# Patient Record
Sex: Female | Born: 1935
Health system: Southern US, Community
[De-identification: ages and names within clinical notes are randomized; demographics above are authoritative.]

## PROBLEM LIST (undated history)

## (undated) DIAGNOSIS — T4145XA Adverse effect of unspecified anesthetic, initial encounter: Secondary | ICD-10-CM

## (undated) DIAGNOSIS — T8859XA Other complications of anesthesia, initial encounter: Secondary | ICD-10-CM

## (undated) DIAGNOSIS — I4891 Unspecified atrial fibrillation: Secondary | ICD-10-CM

## (undated) DIAGNOSIS — Z789 Other specified health status: Secondary | ICD-10-CM

## (undated) DIAGNOSIS — I10 Essential (primary) hypertension: Secondary | ICD-10-CM

## (undated) HISTORY — PX: TUBAL LIGATION: SHX77

## (undated) HISTORY — PX: EYE SURGERY: SHX253

## (undated) HISTORY — PX: MOUTH SURGERY: SHX715

## (undated) HISTORY — PX: ABDOMINAL HYSTERECTOMY: SHX81

## (undated) HISTORY — PX: TONSILLECTOMY: SUR1361

---

## 1999-09-21 ENCOUNTER — Encounter: Payer: Self-pay | Admitting: Internal Medicine

## 1999-09-21 ENCOUNTER — Encounter: Admission: RE | Admit: 1999-09-21 | Discharge: 1999-09-21 | Payer: Self-pay | Admitting: Internal Medicine

## 2001-02-15 ENCOUNTER — Encounter: Payer: Self-pay | Admitting: Internal Medicine

## 2001-02-15 ENCOUNTER — Encounter: Admission: RE | Admit: 2001-02-15 | Discharge: 2001-02-15 | Payer: Self-pay | Admitting: Internal Medicine

## 2002-02-14 ENCOUNTER — Encounter: Payer: Self-pay | Admitting: Internal Medicine

## 2002-02-14 ENCOUNTER — Encounter: Admission: RE | Admit: 2002-02-14 | Discharge: 2002-02-14 | Payer: Self-pay | Admitting: Internal Medicine

## 2002-03-20 ENCOUNTER — Encounter: Payer: Self-pay | Admitting: Ophthalmology

## 2002-03-22 ENCOUNTER — Ambulatory Visit (HOSPITAL_COMMUNITY): Admission: RE | Admit: 2002-03-22 | Discharge: 2002-03-23 | Payer: Self-pay | Admitting: Ophthalmology

## 2003-05-14 ENCOUNTER — Encounter: Admission: RE | Admit: 2003-05-14 | Discharge: 2003-05-14 | Payer: Self-pay | Admitting: Internal Medicine

## 2003-05-14 ENCOUNTER — Encounter: Payer: Self-pay | Admitting: Internal Medicine

## 2005-05-11 ENCOUNTER — Encounter: Admission: RE | Admit: 2005-05-11 | Discharge: 2005-05-11 | Payer: Self-pay | Admitting: Internal Medicine

## 2006-06-01 ENCOUNTER — Encounter: Admission: RE | Admit: 2006-06-01 | Discharge: 2006-06-01 | Payer: Self-pay | Admitting: Internal Medicine

## 2007-08-03 ENCOUNTER — Encounter: Admission: RE | Admit: 2007-08-03 | Discharge: 2007-08-03 | Payer: Self-pay | Admitting: Internal Medicine

## 2008-11-20 ENCOUNTER — Encounter: Admission: RE | Admit: 2008-11-20 | Discharge: 2008-11-20 | Payer: Self-pay | Admitting: Internal Medicine

## 2009-08-10 ENCOUNTER — Encounter: Admission: RE | Admit: 2009-08-10 | Discharge: 2009-08-10 | Payer: Self-pay | Admitting: Internal Medicine

## 2010-02-19 ENCOUNTER — Encounter: Admission: RE | Admit: 2010-02-19 | Discharge: 2010-02-19 | Payer: Self-pay | Admitting: Internal Medicine

## 2010-06-17 ENCOUNTER — Ambulatory Visit: Payer: Self-pay | Admitting: Diagnostic Radiology

## 2010-06-17 ENCOUNTER — Emergency Department (HOSPITAL_BASED_OUTPATIENT_CLINIC_OR_DEPARTMENT_OTHER): Admission: EM | Admit: 2010-06-17 | Discharge: 2010-06-17 | Payer: Self-pay | Admitting: Emergency Medicine

## 2010-06-17 ENCOUNTER — Emergency Department (HOSPITAL_COMMUNITY): Admission: EM | Admit: 2010-06-17 | Discharge: 2010-06-17 | Payer: Self-pay | Admitting: Emergency Medicine

## 2011-01-11 HISTORY — PX: DENTAL SURGERY: SHX609

## 2011-01-21 NOTE — Op Note (Signed)
[place]  Patient:    Gail Jennings, Gail Jennings Visit Number: [id number] MRN: [id number]          Service Type: DSU Location: 5700 [id number] Attending Physician:  Ivor Messier Dictated by:   Guadelupe Sabin, M.D. Proc. Date: 03/22/02 Admit Date:  03/22/2002 Discharge Date: 03/23/2002   CC:         Lenon Curt. Cassell Clement, M.D.   Operative Report  PREOPERATIVE DIAGNOSIS:  Senile amber nuclear cataract, right eye.  POSTOPERATIVE DIAGNOSIS:  Senile amber nuclear cataract, right eye.  OPERATION:  Planned extracapsular cataract extraction - phacoemulsification, primary insertion of posterior chamber intraocular lens implant.  SURGEON:  Guadelupe Sabin, M.D.  ASSISTANT:  Nurse.  ANESTHESIA:  General, due to patients high anxiety.  OPERATIVE PROCEDURE:  After the patient was prepped and draped, a lid speculum was inserted in the right eye.  The eye was turned downward and the superior rectus traction suture placed.  Schiotz tonometry was recorded at 12 scale units with a 5.5 g weight.  A peritomy was performed adjacent to the limbus from the 11 to 1 oclock position.  A corneal scleral groove was placed with a 45 degree Superblade.  The anterior chamber was then entered at the 12 oclock position with a 2.5 mm diamond keratome and at the 2:30 position with a 15 degree blade.  Using a bent 26 gauge needle and a Helons syringe, a circular capsular rectus was begun and then completed with the _________ forceps.  Hydrodissection and hydrodelineation were performed using 1% Xylocaine.  A 30 degree phacoemulsification tip was then inserted with slow controlled emulsification of the lens nucleus with some back lens chopping. Total ultrasonic time:  52 seconds.  Average power level 18%.  Total amount of fluid used:  80 cc.  Following removal of the amber nucleus, the residual cortex was aspirated with the irrigation aspiration tip.  Several small  areas of posterior subcapsular plaque were also removed.  The posterior capsule appeared intact with a brilliant red fundus reflex.  It was therefore elected to insert an Allergan and surgical optics SI 40NB silicone three piece posterior chamber intraocular lens implant with UV absorber.  Diopter strength plus 15.50.  This was inserted with a McDonald forceps into the anterior chamber and then centered into the capsular bag using the [facility] lens rotator. The lens appeared to be well centered.  The Helon which had been used throughout the procedure was aspirated, replaced with balanced salt solution and Miochol ophthalmic solution.  The operative incision appeared to be self-sealing and there was no leakage.  It was elected, however, to place one 10-0 interrupted radial nylon suture across the incision to ensure closure during the period of awakening from general anesthesia.  Maxitrol ointment was instilled in the conjunctival cul-de-sac and a light patch and protective shield applied.  Duration of procedure and anesthesia administration: 45 minutes.  The patient tolerated the procedure well in general and left the operating room for the recovery room in good condition, and subsequently to the 23 hour observation unit. Dictated by:   Guadelupe Sabin, M.D. Attending Physician:  Ivor Messier DD:  03/22/02 TD:  03/26/02 Job: [id number]

## 2011-01-21 NOTE — H&P (Signed)
[place]  Patient:    Gail Jennings, Gail Jennings Visit Number: [id number] MRN: [id number]          Service Type: DSU Location: 5700 [id number] Attending Physician:  Ivor Messier Dictated by:   Guadelupe Sabin, M.D. Admit Date:  03/22/2002 Discharge Date: 03/23/2002                           History and Physical  This was a planned outpatient surgical admission of this 77 year old white female admitted for cataract implant surgery of the right eye.  HISTORY OF PRESENT ILLNESS:  This patient has a past history of retinal detachment, right eye, and a retinal lattice degeneration and tears, left eye. She has undergone previous scleral buckling procedure, right eye, and cryopexy of the left eye by Dr. Arloa Koh of the [facility] in 1991. The patient did well following this procedure.  She had additional touch-up laser photocoagulation of the right eye in August 1991 and December 1991.  The patient did well following that procedure, but gradually has developed bilateral amber nuclear cataract formation in both eyes.  This has recently reduced her vision in the right eye to 20/50 minus compared to the left eye at 20/25 plus 3.  She has elected to proceed with cataract implant surgery at this time.  As the patient lives alone and has no family in the immediate area, it has been elected to keep her in the 23 hour observation unit overnight.  Also the patient has high anxiety over the operative procedure and she will be placed under general anesthesia for this reason.  PAST MEDICAL HISTORY:  The patient is under the care of Dr. Frederik Pear and is said to be in good general health.  She has no cardiorespiratory complaints.  ALLERGIES:  The patient states that she is allergic to ANECTINE.  CURRENT MEDICATIONS: 1. Hydrochlorothiazide. 2. Premarin. 3. Vitamins.  REVIEW OF SYSTEMS:  No cardiorespiratory complaints.  PHYSICAL  EXAMINATION:  VITAL SIGNS AS RECORDED ON ADMISSION:  Temperature 97, pulse 70, respirations 16, blood pressure 184/87.  GENERAL APPEARANCE:  The patient is a pleasant, well-nourished, well-developed, anxious, 77 year old white female in no acute distress.  HEENT:  Eyes:  Visual acuity as noted above.  Applanation tonometry normal. Slitlamp examination:  Nuclear cataract formation, both eyes.  Detailed fundus examination shows retinal reattachment in the right eye with a scleral buckling indentation.  The left eye shows multiple areas of cryo application.  CHEST:  Lungs clear to percussion and auscultation.  HEART:  Normal sinus rhythm.  No cardiomegaly.  No murmurs.  ABDOMEN:  Negative.  EXTREMITIES:  Negative.  ADMISSION DIAGNOSIS:  Senile nuclear cataract, both eyes, status post scleral buckling procedure, right eye, for retinal detachment, status post cryopexy, left eye, for retinal degeneration.  SURGICAL PLAN:  Cataract implant surgery, right eye at this time, left eye later as needed. Dictated by:   Guadelupe Sabin, M.D. Attending Physician:  Ivor Messier DD:  03/22/02 TD:  03/26/02 Job: [id number]

## 2011-05-12 ENCOUNTER — Other Ambulatory Visit: Payer: Self-pay | Admitting: Internal Medicine

## 2011-05-12 DIAGNOSIS — Z1231 Encounter for screening mammogram for malignant neoplasm of breast: Secondary | ICD-10-CM

## 2011-05-17 ENCOUNTER — Ambulatory Visit: Payer: Self-pay

## 2011-05-18 ENCOUNTER — Ambulatory Visit
Admission: RE | Admit: 2011-05-18 | Discharge: 2011-05-18 | Disposition: A | Discharge status: Home / Self Care | Payer: BLUE CROSS/BLUE SHIELD | Source: Ambulatory Visit | Attending: Internal Medicine | Admitting: Internal Medicine

## 2011-05-18 DIAGNOSIS — Z1231 Encounter for screening mammogram for malignant neoplasm of breast: Secondary | ICD-10-CM

## 2011-08-10 ENCOUNTER — Ambulatory Visit
Admission: RE | Admit: 2011-08-10 | Discharge: 2011-08-10 | Disposition: A | Discharge status: Home / Self Care | Payer: Medicare Other | Source: Ambulatory Visit | Attending: Internal Medicine | Admitting: Internal Medicine

## 2011-08-10 ENCOUNTER — Other Ambulatory Visit: Payer: Self-pay | Admitting: Internal Medicine

## 2011-08-10 MED ORDER — IOHEXOL 300 MG/ML  SOLN
100.0000 mL | Freq: Once | INTRAMUSCULAR | Status: AC | PRN
  Administered 2011-08-10: 100 mL via INTRAVENOUS

## 2011-08-26 ENCOUNTER — Ambulatory Visit (INDEPENDENT_AMBULATORY_CARE_PROVIDER_SITE_OTHER): Payer: Medicare Other | Admitting: Surgery

## 2011-08-26 ENCOUNTER — Encounter (INDEPENDENT_AMBULATORY_CARE_PROVIDER_SITE_OTHER): Payer: Self-pay | Admitting: Surgery

## 2011-08-26 ENCOUNTER — Other Ambulatory Visit (INDEPENDENT_AMBULATORY_CARE_PROVIDER_SITE_OTHER): Payer: Self-pay | Admitting: Surgery

## 2011-08-26 VITALS — BP 152/94 | HR 68 | Temp 98.4°F | Resp 18 | Ht 64.5 in | Wt 109.6 lb

## 2011-08-26 DIAGNOSIS — K4090 Unilateral inguinal hernia, without obstruction or gangrene, not specified as recurrent: Secondary | ICD-10-CM | POA: Insufficient documentation

## 2011-08-26 NOTE — Patient Instructions (Addendum)
Add MiraLax to daily intake Take prunes and high-fiber cereals in the morning along with plenty of fluids

## 2011-08-26 NOTE — Progress Notes (Signed)
Chief Complaint:  New left inguinal hernia  History of Present Illness:  Gail Jennings is an 77 y.o. female With a new left inguinal hernia that she discovered on December 5. She works out a lot and had this pain in her left groin associated with a new lump. She contacted Dr. Chilton Si who ordered a CT scan. I reviewed this with her initially a left inguinal hernia that may contain some fat and fluid. There did not appear to be bowel in it and she has had no nausea and vomiting.  She is in the midst of some rather extensive dental reconstructive work by Dr. Margaretha Seeds at all and this has been put on hold. However I think that's important to move forward as well. She is not having any pain and no vomiting and I think we can repair this inguinal hernia semielective electively. Past Medical History  Diagnosis Date  . Inguinal hernia     Past Surgical History  Procedure Date  . Abdominal hysterectomy   . Tubal ligation   . Eye surgery     detached retina, cryo, lens implant  . Dental surgery Jan 11, 2011    bone graft and extraction    Medications Prior to Admission  Medication Sig Dispense Refill  . hydrochlorothiazide (HYDRODIURIL) 25 MG tablet daily.      Marland Kitchen PREMARIN 0.45 MG tablet        No current facility-administered medications on file as of 08/26/2011.   Allergies  Allergen Reactions  . Anectine   . Codeine    History reviewed. No pertinent family history. Social History:   reports that she has quit smoking. She does not have any smokeless tobacco history on file. She reports that she drinks alcohol. She reports that she does not use illicit drugs.   REVIEW OF SYSTEMS - PERTINENT POSITIVES ONLY: 15 point review of systems is positive for a prior retinal detachment done in 1991, ongoing dental restorative services. She has had a previous hysterectomy in 89.  Physical Exam:   Blood pressure 152/94, pulse 68, temperature 98.4 F (36.9 C), temperature source Temporal, resp. rate  18, height 5' 4.5" (1.638 m), weight 109 lb 9.6 oz (49.714 kg). Body mass index is 18.52 kg/(m^2).  Gen:  No acute distress.  Well nourished and well groomed.   Neurological: Alert and oriented to person, place, and time. Coordination normal.  Head: Normocephalic and atraumatic.  Eyes: Conjunctivae are normal. Pupils are equal, round, and reactive to light. No scleral icterus.  Neck: Normal range of motion. Neck supple. No tracheal deviation or thyromegaly present.  Cardiovascular:  SR without murmurs or gallops Respiratory: Effort normal.  No respiratory distress. No chest wall tenderness. Breath sounds normal.  No wheezes, rales or rhonchi.  GI: Soft. Bowel sounds are normal. The abdomen is soft and nontender.  There is no rebound and no guarding. GU:  No inguinal herniae Musculoskeletal: Normal range of motion. Extremities are nontender.  Lymphadenopathy: No cervical, preauricular, postauricular or axillary adenopathy is present Skin: Skin is warm and dry. No rash noted. No diaphoresis. No erythema. No pallor. No clubbing, cyanosis, or edema.  Pscyh: Normal mood and affect. Behavior is normal. Judgment and thought content normal.   LABORATORY RESULTS: No results found for this or any previous visit (from the past 48 hour(s)).  RADIOLOGY RESULTS: No results found.  Problem List: Active Problems:  * No active hospital problems. *    Assessment & Plan: New left inguinal hernia without evidence  of obstruction but is symptomatic. Will plan repair in January 2013. We have given her a booklet on hernia repair and describe this in some detail with her.    Matt B. Daphine Deutscher, MD, [facility] [phone] beeper [phone]  08/26/2011 12:15 PM

## 2011-09-19 ENCOUNTER — Telehealth (INDEPENDENT_AMBULATORY_CARE_PROVIDER_SITE_OTHER): Payer: Self-pay | Admitting: General Surgery

## 2011-09-19 NOTE — Telephone Encounter (Signed)
on 1/11/Spoke with Kathie Rhodes at Dr Amanda Cockayne office regarding Gail Jennings, Kathie Rhodes was needing our surgical clearance for Gail Jennings. Faxed office notes to her attention

## 2011-09-22 ENCOUNTER — Other Ambulatory Visit: Payer: Self-pay

## 2011-09-22 ENCOUNTER — Encounter (HOSPITAL_BASED_OUTPATIENT_CLINIC_OR_DEPARTMENT_OTHER): Payer: Self-pay | Admitting: *Deleted

## 2011-09-22 ENCOUNTER — Encounter (HOSPITAL_BASED_OUTPATIENT_CLINIC_OR_DEPARTMENT_OTHER)
Admission: RE | Admit: 2011-09-22 | Discharge: 2011-09-22 | Disposition: A | Discharge status: Home / Self Care | Payer: Medicare Other | Source: Ambulatory Visit | Attending: Surgery | Admitting: Surgery

## 2011-09-22 DIAGNOSIS — Z01810 Encounter for preprocedural cardiovascular examination: Secondary | ICD-10-CM | POA: Diagnosis not present

## 2011-09-22 DIAGNOSIS — K4090 Unilateral inguinal hernia, without obstruction or gangrene, not specified as recurrent: Secondary | ICD-10-CM | POA: Diagnosis not present

## 2011-09-22 LAB — BASIC METABOLIC PANEL
BUN: 10 mg/dL (ref 6–23)
Calcium: 9.2 mg/dL (ref 8.4–10.5)
Chloride: 97 mEq/L (ref 96–112)
GFR calc Af Amer: >90 mL/min (ref >90)
Sodium: 136 mEq/L (ref 135–145)

## 2011-09-22 NOTE — Progress Notes (Signed)
ekg and bmet done- 

## 2011-09-27 ENCOUNTER — Encounter (HOSPITAL_BASED_OUTPATIENT_CLINIC_OR_DEPARTMENT_OTHER)
Admission: RE | Disposition: A | Discharge status: Home / Self Care | Payer: Self-pay | Source: Ambulatory Visit | Attending: Surgery

## 2011-09-27 ENCOUNTER — Encounter (HOSPITAL_BASED_OUTPATIENT_CLINIC_OR_DEPARTMENT_OTHER): Payer: Self-pay | Admitting: *Deleted

## 2011-09-27 ENCOUNTER — Ambulatory Visit (HOSPITAL_BASED_OUTPATIENT_CLINIC_OR_DEPARTMENT_OTHER): Payer: Medicare Other | Admitting: *Deleted

## 2011-09-27 ENCOUNTER — Encounter (HOSPITAL_BASED_OUTPATIENT_CLINIC_OR_DEPARTMENT_OTHER): Payer: Self-pay | Admitting: Certified Registered"

## 2011-09-27 ENCOUNTER — Encounter (HOSPITAL_BASED_OUTPATIENT_CLINIC_OR_DEPARTMENT_OTHER): Payer: Self-pay

## 2011-09-27 ENCOUNTER — Ambulatory Visit (HOSPITAL_BASED_OUTPATIENT_CLINIC_OR_DEPARTMENT_OTHER)
Admission: RE | Admit: 2011-09-27 | Discharge: 2011-09-27 | Disposition: A | Discharge status: Home / Self Care | Payer: Medicare Other | Source: Ambulatory Visit | Attending: Surgery | Admitting: Surgery

## 2011-09-27 DIAGNOSIS — Z01810 Encounter for preprocedural cardiovascular examination: Secondary | ICD-10-CM | POA: Insufficient documentation

## 2011-09-27 DIAGNOSIS — K4090 Unilateral inguinal hernia, without obstruction or gangrene, not specified as recurrent: Secondary | ICD-10-CM

## 2011-09-27 HISTORY — DX: Other complications of anesthesia, initial encounter: T88.59XA

## 2011-09-27 HISTORY — DX: Adverse effect of unspecified anesthetic, initial encounter: T41.45XA

## 2011-09-27 HISTORY — PX: INGUINAL HERNIA REPAIR: SHX194

## 2011-09-27 HISTORY — DX: Other specified health status: Z78.9

## 2011-09-27 SURGERY — REPAIR, HERNIA, INGUINAL, INCARCERATED
Anesthesia: General | Site: Groin | Laterality: Left | Wound class: Clean

## 2011-09-27 MED ORDER — MEPERIDINE HCL 25 MG/ML IJ SOLN: 6.2500 mg | INTRAMUSCULAR | Status: DC | PRN

## 2011-09-27 MED ORDER — OXYCODONE-ACETAMINOPHEN 5-325 MG PO TABS: 1.0000 | ORAL_TABLET | ORAL | Status: AC | PRN

## 2011-09-27 MED ORDER — DEXAMETHASONE SODIUM PHOSPHATE 4 MG/ML IJ SOLN
INTRAMUSCULAR | Status: DC | PRN
  Administered 2011-09-27: 4 mg via INTRAVENOUS

## 2011-09-27 MED ORDER — GLYCOPYRROLATE 0.2 MG/ML IJ SOLN
INTRAMUSCULAR | Status: DC | PRN
  Administered 2011-09-27: 0.2 mg via INTRAVENOUS

## 2011-09-27 MED ORDER — LIDOCAINE HCL (CARDIAC) 20 MG/ML IV SOLN
INTRAVENOUS | Status: DC | PRN
  Administered 2011-09-27: 80 mg via INTRAVENOUS

## 2011-09-27 MED ORDER — HEPARIN SODIUM (PORCINE) 5000 UNIT/ML IJ SOLN
5000.0000 [IU] | Freq: Once | INTRAMUSCULAR | Status: AC
  Administered 2011-09-27: 5000 [IU] via SUBCUTANEOUS

## 2011-09-27 MED ORDER — IBUPROFEN 200 MG PO TABS: 200.0000 mg | ORAL_TABLET | Freq: Four times a day (QID) | ORAL | Status: DC | PRN

## 2011-09-27 MED ORDER — KETOROLAC TROMETHAMINE 30 MG/ML IJ SOLN: 15.0000 mg | Freq: Once | INTRAMUSCULAR | Status: DC | PRN

## 2011-09-27 MED ORDER — SODIUM CHLORIDE 0.9 % IV SOLN: 250.0000 mL | INTRAVENOUS | Status: DC | PRN

## 2011-09-27 MED ORDER — SODIUM CHLORIDE 0.9 % IJ SOLN: 3.0000 mL | Freq: Two times a day (BID) | INTRAMUSCULAR | Status: DC

## 2011-09-27 MED ORDER — CEFAZOLIN SODIUM 1-5 GM-% IV SOLN
1.0000 g | INTRAVENOUS | Status: AC
  Administered 2011-09-27: 1 g via INTRAVENOUS

## 2011-09-27 MED ORDER — ONDANSETRON HCL 4 MG/2ML IJ SOLN: 4.0000 mg | Freq: Four times a day (QID) | INTRAMUSCULAR | Status: DC | PRN

## 2011-09-27 MED ORDER — SODIUM CHLORIDE 0.9 % IJ SOLN: 3.0000 mL | INTRAMUSCULAR | Status: DC | PRN

## 2011-09-27 MED ORDER — OXYCODONE HCL 5 MG PO TABS: 5.0000 mg | ORAL_TABLET | ORAL | Status: DC | PRN

## 2011-09-27 MED ORDER — FENTANYL CITRATE 0.05 MG/ML IJ SOLN
INTRAMUSCULAR | Status: DC | PRN
  Administered 2011-09-27 (×2): 25 ug via INTRAVENOUS
  Administered 2011-09-27: 50 ug via INTRAVENOUS

## 2011-09-27 MED ORDER — ACETAMINOPHEN 650 MG RE SUPP: 650.0000 mg | RECTAL | Status: DC | PRN

## 2011-09-27 MED ORDER — PROMETHAZINE HCL 25 MG/ML IJ SOLN: 12.5000 mg | Freq: Four times a day (QID) | INTRAMUSCULAR | Status: DC | PRN

## 2011-09-27 MED ORDER — PROPOFOL 10 MG/ML IV EMUL
INTRAVENOUS | Status: DC | PRN
  Administered 2011-09-27: 150 mg via INTRAVENOUS

## 2011-09-27 MED ORDER — BUPIVACAINE HCL (PF) 0.25 % IJ SOLN
INTRAMUSCULAR | Status: DC | PRN
  Administered 2011-09-27: 18 mL

## 2011-09-27 MED ORDER — ACETAMINOPHEN 325 MG PO TABS: 650.0000 mg | ORAL_TABLET | ORAL | Status: DC | PRN

## 2011-09-27 MED ORDER — ONDANSETRON HCL 4 MG/2ML IJ SOLN
INTRAMUSCULAR | Status: DC | PRN
  Administered 2011-09-27: 4 mg via INTRAVENOUS

## 2011-09-27 MED ORDER — LACTATED RINGERS IV SOLN
INTRAVENOUS | Status: DC
  Administered 2011-09-27 (×3): via INTRAVENOUS

## 2011-09-27 MED ORDER — MIDAZOLAM HCL 5 MG/5ML IJ SOLN
INTRAMUSCULAR | Status: DC | PRN
  Administered 2011-09-27: 1 mg via INTRAVENOUS

## 2011-09-27 MED ORDER — HYDROMORPHONE HCL PF 1 MG/ML IJ SOLN
0.2500 mg | INTRAMUSCULAR | Status: DC | PRN
  Administered 2011-09-27 (×2): 0.25 mg via INTRAVENOUS

## 2011-09-27 SURGICAL SUPPLY — 55 items
ADH SKN CLS APL DERMABOND .7 (GAUZE/BANDAGES/DRESSINGS) ×1
APL SKNCLS STERI-STRIP NONHPOA (GAUZE/BANDAGES/DRESSINGS)
BENZOIN TINCTURE PRP APPL 2/3 (GAUZE/BANDAGES/DRESSINGS) IMPLANT
BLADE SURG 15 STRL LF DISP TIS (BLADE) ×1 IMPLANT
BLADE SURG 15 STRL SS (BLADE) ×2
BLADE SURG ROTATE 9660 (MISCELLANEOUS) ×1 IMPLANT
CANISTER SUCTION 1200CC (MISCELLANEOUS) ×2 IMPLANT
CLEANER CAUTERY TIP 5X5 PAD (MISCELLANEOUS) ×1 IMPLANT
CLOTH BEACON ORANGE TIMEOUT ST (SAFETY) ×2 IMPLANT
COVER MAYO STAND STRL (DRAPES) ×2 IMPLANT
COVER TABLE BACK 60X90 (DRAPES) ×2 IMPLANT
DECANTER SPIKE VIAL GLASS SM (MISCELLANEOUS) ×2 IMPLANT
DERMABOND ADVANCED (GAUZE/BANDAGES/DRESSINGS) ×1
DERMABOND ADVANCED .7 DNX12 (GAUZE/BANDAGES/DRESSINGS) IMPLANT
DRAIN PENROSE 1/2X12 LTX STRL (WOUND CARE) ×2 IMPLANT
DRAPE LAPAROTOMY T 102X78X121 (DRAPES) ×2 IMPLANT
ELECT REM PT RETURN 9FT ADLT (ELECTROSURGICAL) ×2
ELECTRODE REM PT RTRN 9FT ADLT (ELECTROSURGICAL) ×1 IMPLANT
GAUZE SPONGE 4X4 12PLY STRL LF (GAUZE/BANDAGES/DRESSINGS) ×4 IMPLANT
GAUZE SPONGE 4X4 16PLY XRAY LF (GAUZE/BANDAGES/DRESSINGS) IMPLANT
GLOVE BIO SURGEON STRL SZ 6.5 (GLOVE) ×1 IMPLANT
GLOVE BIO SURGEON STRL SZ7 (GLOVE) ×1 IMPLANT
GLOVE BIO SURGEON STRL SZ8 (GLOVE) ×2 IMPLANT
GLOVE BIOGEL PI IND STRL 7.0 (GLOVE) IMPLANT
GLOVE BIOGEL PI INDICATOR 7.0 (GLOVE) ×2
GLOVE INDICATOR 7.0 STRL GRN (GLOVE) ×1 IMPLANT
GOWN PREVENTION PLUS XLARGE (GOWN DISPOSABLE) ×2 IMPLANT
GOWN PREVENTION PLUS XXLARGE (GOWN DISPOSABLE) ×2 IMPLANT
MESH HERNIA 3X6 (Mesh General) ×1 IMPLANT
NDL HYPO 25X1 1.5 SAFETY (NEEDLE) IMPLANT
NEEDLE HYPO 25X1 1.5 SAFETY (NEEDLE) ×2 IMPLANT
NS IRRIG 1000ML POUR BTL (IV SOLUTION) ×2 IMPLANT
PACK BASIN DAY SURGERY FS (CUSTOM PROCEDURE TRAY) ×2 IMPLANT
PAD CLEANER CAUTERY TIP 5X5 (MISCELLANEOUS) ×1
PENCIL BUTTON HOLSTER BLD 10FT (ELECTRODE) ×2 IMPLANT
SLEEVE SCD COMPRESS KNEE MED (MISCELLANEOUS) ×1 IMPLANT
STAPLER VISISTAT 35W (STAPLE) IMPLANT
STRIP CLOSURE SKIN 1/2X4 (GAUZE/BANDAGES/DRESSINGS) IMPLANT
SUT MON AB 5-0 PS2 18 (SUTURE) IMPLANT
SUT PROLENE 0 CT 1 30 (SUTURE) IMPLANT
SUT PROLENE 2 0 CT2 30 (SUTURE) ×4 IMPLANT
SUT SILK 2 0 SH (SUTURE) ×2 IMPLANT
SUT VIC AB 2-0 SH 27 (SUTURE) ×2
SUT VIC AB 2-0 SH 27XBRD (SUTURE) ×1 IMPLANT
SUT VIC AB 4-0 SH 18 (SUTURE) ×2 IMPLANT
SUT VIC AB 5-0 P-3 18X BRD (SUTURE) IMPLANT
SUT VIC AB 5-0 P3 18 (SUTURE)
SUT VICRYL 4-0 PS2 18IN ABS (SUTURE) IMPLANT
SYR BULB 3OZ (MISCELLANEOUS) ×2 IMPLANT
SYR CONTROL 10ML LL (SYRINGE) ×1 IMPLANT
TOWEL OR 17X24 6PK STRL BLUE (TOWEL DISPOSABLE) ×4 IMPLANT
TRAY DSU PREP LF (CUSTOM PROCEDURE TRAY) ×2 IMPLANT
TUBE CONNECTING 20X1/4 (TUBING) ×2 IMPLANT
WATER STERILE IRR 1000ML POUR (IV SOLUTION) ×2 IMPLANT
YANKAUER SUCT BULB TIP NO VENT (SUCTIONS) ×2 IMPLANT

## 2011-09-27 NOTE — Op Note (Signed)
Surgeon: Wenda Low, MD, FACS  Asst:  none  Anes:  general  Procedure: Open left inguinal hernia repair with ligation of the gubernaculum, repair of direct and indirect herniae with mesh  Diagnosis: Left pantaloon herniae  Complications: none  EBL:   4 cc  Description of Procedure:  The patient was taken to the OR 8 cone day surgery in the holding area we had marked the left side which is a site where she felt a bulge intermittently. She was prepped with PCMX and draped sterilely. After an appropriate timeout small left oblique incision was made carried down to the oblique fascia. This was incised along the fibers to open up the external ring revealing the gubernaculum. This was dissected free and once free she had a prominent bulge in the floor consistent with a direct hernia. I went proximally and dissected out the cord structures and found an indirect hernia and opened the sac and with my finger inside could feel the direct hernia. Also felt the vessels and felt for and for a femoral hernia and felt none.  We then excised the gubernaculum and then oversew the remnants with 2-0 silk. I did a high ligation of the indirect sac with 2-0 silk. I then repaired the entire floor a piece of Marlex type mesh cut to fit and sutured along the inguinal ligament all way out to the ring and medially and then tucked beneath the external oblique fascia. Area was infiltrated with Marcaine and closed with running 2-0 Vicryl. The wound was then closed with 4-0 Vicryl interrupted and with Dermabond. Patient seemed to tolerate procedure well. Operation completed around 345 so she will be kept in the overnight observation mode at recovery care center.  Matt B. Daphine Deutscher, MD, [facility], [place] [phone]

## 2011-09-27 NOTE — H&P (Signed)
Chief Complaint: New left inguinal hernia  History of Present Illness: Gail Jennings is an 78 y.o. female With a new left inguinal hernia that she discovered on December 5. She works out a lot and had this pain in her left groin associated with a new lump. She contacted Dr. Chilton Si who ordered a CT scan. I reviewed this with her initially a left inguinal hernia that may contain some fat and fluid. There did not appear to be bowel in it and she has had no nausea and vomiting.  She is in the midst of some rather extensive dental reconstructive work by Dr. Margaretha Seeds at all and this has been put on hold. However I think that's important to move forward as well. She is not having any pain and no vomiting and I think we can repair this inguinal hernia semielective electively.  Past Medical History   Diagnosis  Date   .  Inguinal hernia     Past Surgical History   Procedure  Date   .  Abdominal hysterectomy    .  Tubal ligation    .  Eye surgery      detached retina, cryo, lens implant   .  Dental surgery  Jan 11, 2011     bone graft and extraction    Medications Prior to Admission   Medication  Sig  Dispense  Refill   .  hydrochlorothiazide (HYDRODIURIL) 25 MG tablet  daily.     Marland Kitchen  PREMARIN 0.45 MG tablet       No current facility-administered medications on file as of 08/26/2011.    Allergies   Allergen  Reactions   .  Anectine    .  Codeine     History reviewed. No pertinent family history.  Social History: reports that she has quit smoking. She does not have any smokeless tobacco history on file. She reports that she drinks alcohol. She reports that she does not use illicit drugs.  REVIEW OF SYSTEMS - PERTINENT POSITIVES ONLY:  15 point review of systems is positive for a prior retinal detachment done in 1991, ongoing dental restorative services. She has had a previous hysterectomy in 56.  Physical Exam:  Blood pressure 152/94, pulse 68, temperature 98.4 F (36.9 C), temperature source  Temporal, resp. rate 18, height 5' 4.5" (1.638 m), weight 109 lb 9.6 oz (49.714 kg).  Body mass index is 18.52 kg/(m^2).  Gen: No acute distress. Well nourished and well groomed.  Neurological: Alert and oriented to person, place, and time. Coordination normal.  Head: Normocephalic and atraumatic.  Eyes: Conjunctivae are normal. Pupils are equal, round, and reactive to light. No scleral icterus.  Neck: Normal range of motion. Neck supple. No tracheal deviation or thyromegaly present.  Cardiovascular: SR without murmurs or gallops  Respiratory: Effort normal. No respiratory distress. No chest wall tenderness. Breath sounds normal. No wheezes, rales or rhonchi.  GI: Soft. Bowel sounds are normal. The abdomen is soft and nontender. There is no rebound and no guarding.  GU: No inguinal herniae Musculoskeletal: Normal range of motion. Extremities are nontender.  Lymphadenopathy: No cervical, preauricular, postauricular or axillary adenopathy is present Skin: Skin is warm and dry. No rash noted. No diaphoresis. No erythema. No pallor. No clubbing, cyanosis, or edema.  Pscyh: Normal mood and affect. Behavior is normal. Judgment and thought content normal.  LABORATORY RESULTS:  No results found for this or any previous visit (from the past 48 hour(s)).  RADIOLOGY RESULTS:  No results found.  Problem List:  Active Problems:  * No active hospital problems. *   Assessment & Plan:  New left inguinal hernia without evidence of obstruction but is symptomatic. Will plan repair in January 2013. We have given her a booklet on hernia repair and describe this in some detail with her.  Matt B. Daphine Deutscher, MD, [facility]  [phone] beeper  [phone] There has been no change in the patient's past medical history or physical exam in the past 24 hours to the best of my knowledge.  Expectations and outcome results have been discussed with the patient to include risks and benefits.   All questions have been answered and will proceed with previously discussed procedure noted and signed in the consent form in the patient's record.    [name] BMD @NOW  09/27/2011

## 2011-09-27 NOTE — Anesthesia Postprocedure Evaluation (Signed)
Anesthesia Post Note  Patient: Gail Jennings  Procedure(s) Performed:  HERNIA REPAIR INGUINAL INCARCERATED - Left Ingunial hernia repair with mesh  Anesthesia type: General  Patient location: PACU  Post pain: Pain level controlled and Adequate analgesia  Post assessment: Post-op Vital signs reviewed, Patient's Cardiovascular Status Stable, Respiratory Function Stable, Patent Airway and Pain level controlled  Last Vitals:  Filed Vitals:   09/27/11 1545  BP: 174/86  Pulse: 83  Temp: 36.4 C  Resp: 16    Post vital signs: Reviewed and stable  Level of consciousness: awake, alert  and oriented  Complications: No apparent anesthesia complications

## 2011-09-27 NOTE — Anesthesia Procedure Notes (Signed)
Procedure Name: LMA Insertion Date/Time: 09/27/2011 2:21 PM Performed by: Jearld Shines Pre-anesthesia Checklist: Patient identified, Emergency Drugs available, Suction available and Patient being monitored Patient Re-evaluated:Patient Re-evaluated prior to inductionOxygen Delivery Method: Circle System Utilized Preoxygenation: Pre-oxygenation with 100% oxygen Intubation Type: IV induction Ventilation: Mask ventilation without difficulty LMA: LMA inserted LMA Size: 4.0 and 3.0 Number of attempts: 1 Airway Equipment and Method: bite block Placement Confirmation: positive ETCO2 Tube secured with: Tape Dental Injury: Teeth and Oropharynx as per pre-operative assessment

## 2011-09-27 NOTE — Anesthesia Preprocedure Evaluation (Addendum)
Anesthesia Evaluation  Patient identified by MRN, date of birth, ID band Patient awake    History of Anesthesia Complications (+) PSEUDOCHOLINESTERASE DEFICIENCY  Airway Mallampati: I  Neck ROM: Full    Dental  (+) Teeth Intact, Caps, Loose and Implants,    Pulmonary neg pulmonary ROS,  clear to auscultation        Cardiovascular neg cardio ROS Regular Normal    Neuro/Psych Negative Neurological ROS     GI/Hepatic negative GI ROS, Neg liver ROS,   Endo/Other    Renal/GU negative Renal ROS     Musculoskeletal   Abdominal   Peds  Hematology   Anesthesia Other Findings   Reproductive/Obstetrics                           Anesthesia Physical Anesthesia Plan  ASA: I  Anesthesia Plan: General   Post-op Pain Management:    Induction: Intravenous  Airway Management Planned: LMA  Additional Equipment:   Intra-op Plan:   Post-operative Plan: Extubation in OR  Informed Consent: I have reviewed the patients History and Physical, chart, labs and discussed the procedure including the risks, benefits and alternatives for the proposed anesthesia with the patient or authorized representative who has indicated his/her understanding and acceptance.   Dental advisory given  Plan Discussed with: CRNA and Surgeon  Anesthesia Plan Comments:         Anesthesia Quick Evaluation

## 2011-09-27 NOTE — Transfer of Care (Signed)
Immediate Anesthesia Transfer of Care Note  Patient: Gail Jennings  Procedure(s) Performed:  HERNIA REPAIR INGUINAL INCARCERATED - Left Ingunial hernia repair with mesh  Patient Location: PACU  Anesthesia Type: General  Level of Consciousness: awake, alert , oriented and patient cooperative  Airway & Oxygen Therapy: Patient Spontanous Breathing and Patient connected to face mask oxygen  Post-op Assessment: Report given to PACU RN and Post -op Vital signs reviewed and stable  Post vital signs: Reviewed and stable  Complications: No apparent anesthesia complications

## 2011-09-28 ENCOUNTER — Telehealth (INDEPENDENT_AMBULATORY_CARE_PROVIDER_SITE_OTHER): Payer: Self-pay | Admitting: Surgery

## 2011-09-28 ENCOUNTER — Encounter (HOSPITAL_BASED_OUTPATIENT_CLINIC_OR_DEPARTMENT_OTHER): Payer: Self-pay | Admitting: Surgery

## 2011-10-20 ENCOUNTER — Encounter (INDEPENDENT_AMBULATORY_CARE_PROVIDER_SITE_OTHER): Payer: Self-pay | Admitting: Surgery

## 2011-10-20 ENCOUNTER — Ambulatory Visit (INDEPENDENT_AMBULATORY_CARE_PROVIDER_SITE_OTHER): Payer: Medicare Other | Admitting: Surgery

## 2011-10-20 VITALS — BP 144/82 | Temp 97.8°F | Resp 18 | Ht 64.5 in | Wt 108.2 lb

## 2011-10-20 DIAGNOSIS — K4090 Unilateral inguinal hernia, without obstruction or gangrene, not specified as recurrent: Secondary | ICD-10-CM

## 2011-10-20 NOTE — Progress Notes (Signed)
Gail Jennings 78 y.o.  Body mass index is 18.29 kg/(m^2).  Patient Active Problem List  Diagnoses  . Inguinal hernia, left    Allergies  Allergen Reactions  . Anectine     Prolonged paralysis  . Codeine   . Hydrocodone Nausea And Vomiting    Past Surgical History  Procedure Date  . Abdominal hysterectomy   . Tubal ligation   . Dental surgery Jan 11, 2011    bone graft and extraction  . Eye surgery     detached retina, cryo, lens implant  . Tonsillectomy   . Mouth surgery     lt upper bone graft for implants  . Inguinal hernia repair 09/27/2011    Procedure: HERNIA REPAIR INGUINAL INCARCERATED;  Surgeon: Valarie Merino, MD;  Location: [facility];  Service: General;  Laterality: Left;  Left Ingunial hernia repair with mesh   GREEN, [name], MD, MD No diagnosis found.  Incision healing fine.  Dermabond in place.  Return to exercises in about 4 weeks.  Doing well Gail B. Daphine Deutscher, MD, [facility] [phone] beeper [phone]  10/20/2011 10:33 AM

## 2012-02-03 ENCOUNTER — Ambulatory Visit (INDEPENDENT_AMBULATORY_CARE_PROVIDER_SITE_OTHER): Payer: Medicare Other | Admitting: Surgery

## 2012-02-03 ENCOUNTER — Telehealth (INDEPENDENT_AMBULATORY_CARE_PROVIDER_SITE_OTHER): Payer: Self-pay

## 2012-02-03 ENCOUNTER — Encounter (INDEPENDENT_AMBULATORY_CARE_PROVIDER_SITE_OTHER): Payer: Self-pay | Admitting: Surgery

## 2012-02-03 VITALS — BP 184/104 | HR 67 | Temp 98.0°F | Ht 64.5 in | Wt 110.2 lb

## 2012-02-03 DIAGNOSIS — R1900 Intra-abdominal and pelvic swelling, mass and lump, unspecified site: Secondary | ICD-10-CM

## 2012-02-03 NOTE — Progress Notes (Signed)
Gail Jennings 78 y.o.  Body mass index is 18.62 kg/(m^2).  Patient Active Problem List  Diagnoses  . Inguinal hernia, left    Allergies  Allergen Reactions  . Codeine   . Hydrocodone Nausea And Vomiting  . Succinylcholine Chloride     Prolonged paralysis    Past Surgical History  Procedure Date  . Abdominal hysterectomy   . Tubal ligation   . Dental surgery Jan 11, 2011    bone graft and extraction  . Eye surgery     detached retina, cryo, lens implant  . Tonsillectomy   . Mouth surgery     lt upper bone graft for implants  . Inguinal hernia repair 09/27/2011    Procedure: HERNIA REPAIR INGUINAL INCARCERATED;  Surgeon: Valarie Merino, MD;  Location: [facility];  Service: General;  Laterality: Left;  Left Ingunial hernia repair with mesh   Gail Jennings, Gail Ishihara, MD, MD No diagnosis found.  Patient showed up to be seen.  New onset of palpable mass in left inguinal region.  Examined standing and supine.  The hernia repair area is intact.  The new area is moveable, slightly tender and could be consistent with a lymph node or a femoral hernia.  Advised to continue activity and we will recheck. Will see her in 3 weeks.   Matt B. Daphine Deutscher, MD, [facility] [phone] beeper [phone]  02/03/2012 3:25 PM

## 2012-02-03 NOTE — Telephone Encounter (Signed)
The patient states she had hernia repair back in January.  She now has a lump in the area.  There is no inflammation but it is sore down in to the leg because it was in the lower groin.  She would like to see Dr Daphine Deutscher soon to have him check it.  Please let her know if you can get her in.

## 2012-02-03 NOTE — Patient Instructions (Signed)
Continue current exercise pattern.

## 2012-02-23 ENCOUNTER — Encounter (INDEPENDENT_AMBULATORY_CARE_PROVIDER_SITE_OTHER): Payer: Self-pay | Admitting: Surgery

## 2012-02-23 ENCOUNTER — Ambulatory Visit (INDEPENDENT_AMBULATORY_CARE_PROVIDER_SITE_OTHER): Payer: Medicare Other | Admitting: Surgery

## 2012-02-23 VITALS — BP 158/84 | HR 70 | Temp 97.6°F | Resp 18 | Ht 64.5 in | Wt 110.5 lb

## 2012-02-23 DIAGNOSIS — R1900 Intra-abdominal and pelvic swelling, mass and lump, unspecified site: Secondary | ICD-10-CM

## 2012-02-23 NOTE — Patient Instructions (Signed)
Thanks for your patience.  If you need further assistance after leaving the office, please call our office and speak with Tracy A.  [phone].  If you want to leave a message for Dr. Tryce Surratt, please call his office phone at [phone]. 

## 2012-02-23 NOTE — Progress Notes (Signed)
Gail Jennings 78 y.o.  Body mass index is 18.67 kg/(m^2).  Patient Active Problem List  Diagnosis  . Inguinal hernia, left  . Mass in left inguinal region-node v femoral hernia    Allergies  Allergen Reactions  . Codeine   . Hydrocodone Nausea And Vomiting  . Succinylcholine Chloride     Prolonged paralysis    Past Surgical History  Procedure Date  . Abdominal hysterectomy   . Tubal ligation   . Dental surgery Jan 11, 2011    bone graft and extraction  . Eye surgery     detached retina, cryo, lens implant  . Tonsillectomy   . Mouth surgery     lt upper bone graft for implants  . Inguinal hernia repair 09/27/2011    Procedure: HERNIA REPAIR INGUINAL INCARCERATED;  Surgeon: Valarie Merino, MD;  Location: [facility];  Service: General;  Laterality: Left;  Left Ingunial hernia repair with mesh   GREEN, [name], MD No diagnosis found.  Feeling better.  She feels like the mass is smaller. I think he could well be a left femoral hernia. It certainly could be adenopathy L. it may be getting smaller if it is. I think she should continue her workouts and see me back in 2 months and will reassess the situation. If need be we will get a CT scan of her pelvis at that point. She agrees with this plan we'll proceed with followup. Matt B. Daphine Deutscher, MD, [facility] [phone] beeper [phone]  02/23/2012 9:27 AM

## 2012-04-27 ENCOUNTER — Ambulatory Visit (INDEPENDENT_AMBULATORY_CARE_PROVIDER_SITE_OTHER): Payer: Medicare Other | Admitting: Surgery

## 2012-04-27 ENCOUNTER — Encounter (INDEPENDENT_AMBULATORY_CARE_PROVIDER_SITE_OTHER): Payer: Self-pay | Admitting: Surgery

## 2012-04-27 VITALS — BP 144/90 | HR 72 | Temp 97.7°F | Resp 14 | Ht 64.0 in | Wt 110.0 lb

## 2012-04-27 DIAGNOSIS — R1900 Intra-abdominal and pelvic swelling, mass and lump, unspecified site: Secondary | ICD-10-CM

## 2012-04-27 NOTE — Patient Instructions (Signed)
May exercise ad lib Followup with Dr. Daphine Deutscher in 6 months

## 2012-04-27 NOTE — Progress Notes (Signed)
Gail Jennings 78 y.o.  Body mass index is 18.88 kg/(m^2).  Patient Active Problem List  Diagnosis  . Inguinal hernia, left  . Mass in left inguinal region-node v femoral hernia    Allergies  Allergen Reactions  . Codeine   . Hydrocodone Nausea And Vomiting  . Succinylcholine Chloride     Prolonged paralysis    Past Surgical History  Procedure Date  . Abdominal hysterectomy   . Tubal ligation   . Dental surgery Jan 11, 2011    bone graft and extraction  . Eye surgery     detached retina, cryo, lens implant  . Tonsillectomy   . Mouth surgery     lt upper bone graft for implants  . Inguinal hernia repair 09/27/2011    Procedure: HERNIA REPAIR INGUINAL INCARCERATED;  Surgeon: Valarie Merino, MD;  Location: [facility];  Service: General;  Laterality: Left;  Left Ingunial hernia repair with mesh   GREEN, [name], MD No diagnosis found.  Office Visit:  Gail Jennings comes in today in followup. When I repaired her hernia in January she had a large direct hernia and also a small indirect. Through the indirect and inserted a finger and did not feel a femoral hernia although the current level not is in a position where it might be a little fluid filled femoral hernia. It is not bothering her and I reviewed this with a CT scan and it doesn't appear to contain any bowel.  I've explained to her the situation and I think it is good for her to go into back to her level of exercise that she wants. I've encouraged her to work on yoga and due her weights. I will see her again in 6 months in routine followup.  Impression:   persistent palpable knot  is in the region of an infrainguinal femoral hernia. Asymptomatic-observe  Matt B. Daphine Deutscher, MD, [facility] [phone] beeper [phone]  04/27/2012 2:57 PM

## 2012-05-24 DIAGNOSIS — M899 Disorder of bone, unspecified: Secondary | ICD-10-CM | POA: Diagnosis not present

## 2012-06-25 ENCOUNTER — Other Ambulatory Visit: Payer: Self-pay | Admitting: Internal Medicine

## 2012-06-25 DIAGNOSIS — Z1231 Encounter for screening mammogram for malignant neoplasm of breast: Secondary | ICD-10-CM

## 2012-07-26 ENCOUNTER — Ambulatory Visit
Admission: RE | Admit: 2012-07-26 | Discharge: 2012-07-26 | Disposition: A | Discharge status: Home / Self Care | Payer: Medicare Other | Source: Ambulatory Visit | Attending: Internal Medicine | Admitting: Internal Medicine

## 2012-07-26 DIAGNOSIS — Z1231 Encounter for screening mammogram for malignant neoplasm of breast: Secondary | ICD-10-CM

## 2012-09-28 ENCOUNTER — Encounter (INDEPENDENT_AMBULATORY_CARE_PROVIDER_SITE_OTHER): Payer: Self-pay | Admitting: Surgery

## 2012-09-28 ENCOUNTER — Ambulatory Visit (INDEPENDENT_AMBULATORY_CARE_PROVIDER_SITE_OTHER): Payer: Medicare Other | Admitting: Surgery

## 2012-09-28 VITALS — BP 190/100 | HR 72 | Temp 98.0°F | Resp 16 | Ht 64.5 in | Wt 109.2 lb

## 2012-09-28 DIAGNOSIS — R1900 Intra-abdominal and pelvic swelling, mass and lump, unspecified site: Secondary | ICD-10-CM

## 2012-09-28 NOTE — Progress Notes (Signed)
Chief Complaint:  Left femoral hernia  History of Present Illness:  Gail Jennings is an 79 y.o. female patient of Ed Green's with a Femoral hernia that occurred following repair of a left indirect inguinal hernia. We discussed repair in timing. She is a very active lady in length to do yoga etc. We'll try to get this scheduled in March [place] and I think I would try a laparoscopic approach possible open. This likely this is a femoral hernia  Past Medical History  Diagnosis Date  . Inguinal hernia   . Complication of anesthesia     cannot take anectine  . No pertinent past medical history     Past Surgical History  Procedure Date  . Abdominal hysterectomy   . Tubal ligation   . Dental surgery Jan 11, 2011    bone graft and extraction  . Eye surgery     detached retina, cryo, lens implant  . Tonsillectomy   . Mouth surgery     lt upper bone graft for implants  . Inguinal hernia repair 09/27/2011    Procedure: HERNIA REPAIR INGUINAL INCARCERATED;  Surgeon: Valarie Merino, MD;  Location: [facility];  Service: General;  Laterality: Left;  Left Ingunial hernia repair with mesh    Current Outpatient Prescriptions  Medication Sig Dispense Refill  . hydrochlorothiazide (HYDRODIURIL) 25 MG tablet daily.      Marland Kitchen PREMARIN 0.45 MG tablet        Codeine; Hydrocodone; and Succinylcholine chloride No family history on file. Social History:   reports that she quit smoking about 42 years ago. She does not have any smokeless tobacco history on file. She reports that she drinks about 1.2 ounces of alcohol per week. She reports that she does not use illicit drugs.   REVIEW OF SYSTEMS - PERTINENT POSITIVES ONLY: Noncontributory  Physical Exam:   Blood pressure 190/100, pulse 72, temperature 98 F (36.7 C), temperature source Temporal, resp. rate 16, height 5' 4.5" (1.638 m), weight 109 lb 3.2 oz (49.533 kg). Body mass index is 18.45 kg/(m^2).  Gen:  WDWN White female  NAD  Neurological: Alert and oriented to person, place, and time. Motor and sensory function is grossly intact  Head: Normocephalic and atraumatic.  Eyes: Conjunctivae are normal. Pupils are equal, round, and reactive to light. No scleral icterus.  Neck: Normal range of motion. Neck supple. No tracheal deviation or thyromegaly present.  Cardiovascular:  SR without murmurs or gallops.  No carotid bruits Respiratory: Effort normal.  No respiratory distress. No chest wall tenderness. Breath sounds normal.  No wheezes, rales or rhonchi.  Abdomen:  Small bulge below the left inguinal ligament consistent with a femoral hernia. GU: Musculoskeletal: Normal range of motion. Extremities are nontender. No cyanosis, edema or clubbing noted Lymphadenopathy: No cervical, preauricular, postauricular or axillary adenopathy is present Skin: Skin is warm and dry. No rash noted. No diaphoresis. No erythema. No pallor. Pscyh: Normal mood and affect. Behavior is normal. Judgment and thought content normal.   LABORATORY RESULTS: No results found for this or any previous visit (from the past 48 hour(s)).  RADIOLOGY RESULTS: No results found.  Problem List: Patient Active Problem List  Diagnosis  . Inguinal hernia, left  . Mass in left inguinal region-node v femoral hernia    Assessment & Plan: Left femoral hernia  Plan laparoscopic approach with possible modification to open procedure as needed to correct left femoral hernia.    Matt B. Daphine Deutscher, MD, FACS  [facility] [phone] beeper [phone]  09/28/2012 5:29 PM

## 2012-10-05 ENCOUNTER — Telehealth (INDEPENDENT_AMBULATORY_CARE_PROVIDER_SITE_OTHER): Payer: Self-pay

## 2012-10-05 NOTE — Telephone Encounter (Signed)
Called pt to let her know that her post op appt has been made for Thurs, March 20th @ 10a.

## 2012-10-08 ENCOUNTER — Encounter (HOSPITAL_COMMUNITY): Payer: Self-pay | Admitting: Pharmacy Technician

## 2012-10-11 ENCOUNTER — Encounter (HOSPITAL_COMMUNITY)
Admission: RE | Admit: 2012-10-11 | Discharge: 2012-10-11 | Disposition: A | Discharge status: Home / Self Care | Payer: Medicare Other | Source: Ambulatory Visit | Attending: Specialist | Admitting: Specialist

## 2012-10-11 ENCOUNTER — Encounter (HOSPITAL_COMMUNITY): Payer: Self-pay

## 2012-10-11 LAB — BASIC METABOLIC PANEL
BUN: 10 mg/dL (ref 6–23)
Calcium: 9.3 mg/dL (ref 8.4–10.5)
GFR calc non Af Amer: 88 mL/min — ABNORMAL LOW (ref >90)
Glucose, Bld: 107 mg/dL — ABNORMAL HIGH (ref 70–99)
Sodium: 131 mEq/L — ABNORMAL LOW (ref 135–145)

## 2012-10-11 LAB — CBC
HCT: 38.8 % (ref 36.0–46.0)
Hemoglobin: 13.5 g/dL (ref 12.0–15.0)
MCH: 33.3 pg (ref 26.0–34.0)
MCHC: 34.8 g/dL (ref 30.0–36.0)

## 2012-10-11 LAB — SURGICAL PCR SCREEN: MRSA, PCR: NEGATIVE

## 2012-10-11 NOTE — Patient Instructions (Addendum)
20 Gail Jennings  10/11/2012   Your procedure is scheduled on: 2-14  -2014  Report to [facility] at      1000  AM .  Call this number if you have problems the morning of surgery: [phone]  Or Presurgical Testing [phone](Jermond Burkemper)   Remember: Follow any bowel prep instructions per MD office.( One Fleet enema rectally night before at bedtime) For Cpap use: Bring mask and tubing only.   Do not eat food:After Midnight.    Take these medicines the morning of surgery with A SIP OF WATER: take no meds AM of surgery. STOP all over the counter supplements and Herbal meds. No Ibuprofen x 3 days prior to surgery. NO Aspirin products x 5 days of surgery.   Do not wear jewelry, make-up or nail polish.  Do not wear lotions, powders, or perfumes. You may wear deodorant.  Do not shave 12 hours prior to first CHG shower(legs and under arms).(face and neck okay.)  Do not bring valuables to the hospital.  Contacts, dentures or bridgework,body piercing,  may not be worn into surgery.  Leave suitcase in the car. After surgery it may be brought to your room.  For patients admitted to the hospital, checkout time is 11:00 AM the day of discharge.   Patients discharged the day of surgery will not be allowed to drive home. Must have responsible person with you x 24 hours once discharged.  Name and phone number of your driver: Tora Perches friend- [phone] cell/ [phone]  Special Instructions: CHG Shower Use Special Wash: see special instructions.(avoid face and genitals)   Please read over the following fact sheets that you were given: MRSA Information.    Failure to follow these instructions may result in Cancellation of your surgery.   Patient signature_______________________________________________________

## 2012-10-11 NOTE — Pre-Procedure Instructions (Addendum)
10-11-12 EKG done today. -EkG  Prelim. Viewed by Dr. Leta Jungling- states"does not see computer interpretation of rhythm-will await cardiology reading . Saunders Glance Patient comments B/P elevates with MD visits"anxious".W. Londan Coplen,RN 10-11-12 1515 Per  " Nedra Hai"  EKG Dept. [facility]- Dr. Jones Broom -looked at EKG stated "Sinus rhythm, is okay".W. Kennon Portela

## 2012-10-19 ENCOUNTER — Encounter (HOSPITAL_COMMUNITY): Payer: Self-pay | Admitting: Certified Registered Nurse Anesthetist

## 2012-10-19 ENCOUNTER — Ambulatory Visit (HOSPITAL_COMMUNITY): Payer: Medicare Other | Admitting: Certified Registered Nurse Anesthetist

## 2012-10-19 ENCOUNTER — Observation Stay (HOSPITAL_COMMUNITY)
Admission: RE | Admit: 2012-10-19 | Discharge: 2012-10-20 | Disposition: A | Discharge status: Home / Self Care | Payer: Medicare Other | Source: Ambulatory Visit | Attending: Surgery | Admitting: Surgery

## 2012-10-19 ENCOUNTER — Encounter (HOSPITAL_COMMUNITY): Payer: Self-pay | Admitting: *Deleted

## 2012-10-19 ENCOUNTER — Encounter (HOSPITAL_COMMUNITY)
Admission: RE | Disposition: A | Discharge status: Home / Self Care | Payer: Self-pay | Source: Ambulatory Visit | Attending: Surgery

## 2012-10-19 DIAGNOSIS — K4190 Unilateral femoral hernia, without obstruction or gangrene, not specified as recurrent: Principal | ICD-10-CM | POA: Insufficient documentation

## 2012-10-19 DIAGNOSIS — Z79899 Other long term (current) drug therapy: Secondary | ICD-10-CM | POA: Insufficient documentation

## 2012-10-19 DIAGNOSIS — Z90710 Acquired absence of both cervix and uterus: Secondary | ICD-10-CM | POA: Insufficient documentation

## 2012-10-19 DIAGNOSIS — K4090 Unilateral inguinal hernia, without obstruction or gangrene, not specified as recurrent: Secondary | ICD-10-CM

## 2012-10-19 HISTORY — PX: INGUINAL HERNIA REPAIR: SHX194

## 2012-10-19 LAB — CBC
MCH: 33.1 pg (ref 26.0–34.0)
MCV: 96.7 fL (ref 78.0–100.0)
Platelets: 319 10*3/uL (ref 150–400)
RDW: 12.5 % (ref 11.5–15.5)
WBC: 14.1 10*3/uL — ABNORMAL HIGH (ref 4.0–10.5)

## 2012-10-19 LAB — CREATININE, SERUM: Creatinine, Ser: 0.54 mg/dL (ref 0.50–1.10)

## 2012-10-19 SURGERY — REPAIR, HERNIA, INGUINAL, LAPAROSCOPIC
Anesthesia: General | Site: Abdomen | Laterality: Left | Wound class: Clean

## 2012-10-19 MED ORDER — MIDAZOLAM HCL 5 MG/5ML IJ SOLN
INTRAMUSCULAR | Status: DC | PRN
  Administered 2012-10-19: 2 mg via INTRAVENOUS

## 2012-10-19 MED ORDER — HEPARIN SODIUM (PORCINE) 5000 UNIT/ML IJ SOLN
5000.0000 [IU] | Freq: Once | INTRAMUSCULAR | Status: AC
  Administered 2012-10-19: 5000 [IU] via SUBCUTANEOUS
  Filled 2012-10-19: qty 1

## 2012-10-19 MED ORDER — HYDROMORPHONE HCL PF 1 MG/ML IJ SOLN
0.2500 mg | INTRAMUSCULAR | Status: DC | PRN
  Administered 2012-10-19 (×3): 0.5 mg via INTRAVENOUS

## 2012-10-19 MED ORDER — OXYCODONE HCL 5 MG PO TABS: 5.0000 mg | ORAL_TABLET | Freq: Once | ORAL | Status: DC | PRN

## 2012-10-19 MED ORDER — FLEET ENEMA 7-19 GM/118ML RE ENEM
1.0000 | ENEMA | Freq: Once | RECTAL | Status: AC
  Administered 2012-10-19: 1 via RECTAL
  Filled 2012-10-19: qty 1

## 2012-10-19 MED ORDER — LIDOCAINE HCL 4 % MT SOLN
OROMUCOSAL | Status: DC | PRN
  Administered 2012-10-19: 4 mL via TOPICAL

## 2012-10-19 MED ORDER — HEPARIN SODIUM (PORCINE) 5000 UNIT/ML IJ SOLN
5000.0000 [IU] | Freq: Three times a day (TID) | INTRAMUSCULAR | Status: DC
  Administered 2012-10-19 – 2012-10-20 (×2): 5000 [IU] via SUBCUTANEOUS
  Filled 2012-10-19 (×5): qty 1

## 2012-10-19 MED ORDER — CHLORHEXIDINE GLUCONATE 4 % EX LIQD: 1.0000 "application " | Freq: Once | CUTANEOUS | Status: DC

## 2012-10-19 MED ORDER — KCL IN DEXTROSE-NACL 20-5-0.45 MEQ/L-%-% IV SOLN
INTRAVENOUS | Status: DC
  Administered 2012-10-19: 19:00:00 via INTRAVENOUS
  Filled 2012-10-19 (×2): qty 1000

## 2012-10-19 MED ORDER — DEXAMETHASONE SODIUM PHOSPHATE 10 MG/ML IJ SOLN
INTRAMUSCULAR | Status: DC | PRN
  Administered 2012-10-19: 10 mg via INTRAVENOUS

## 2012-10-19 MED ORDER — OXYCODONE-ACETAMINOPHEN 5-325 MG PO TABS
1.0000 | ORAL_TABLET | ORAL | Status: DC | PRN
  Administered 2012-10-19: 1 via ORAL
  Filled 2012-10-19: qty 1

## 2012-10-19 MED ORDER — PROPOFOL 10 MG/ML IV BOLUS
INTRAVENOUS | Status: DC | PRN
  Administered 2012-10-19: 150 mg via INTRAVENOUS

## 2012-10-19 MED ORDER — MORPHINE SULFATE 2 MG/ML IJ SOLN: 1.0000 mg | INTRAMUSCULAR | Status: DC | PRN

## 2012-10-19 MED ORDER — FENTANYL CITRATE 0.05 MG/ML IJ SOLN
INTRAMUSCULAR | Status: DC | PRN
  Administered 2012-10-19: 50 ug via INTRAVENOUS

## 2012-10-19 MED ORDER — GLYCOPYRROLATE 0.2 MG/ML IJ SOLN
INTRAMUSCULAR | Status: DC | PRN
  Administered 2012-10-19: 0.4 mg via INTRAVENOUS

## 2012-10-19 MED ORDER — LACTATED RINGERS IV SOLN
INTRAVENOUS | Status: DC
  Administered 2012-10-19: 1000 mL via INTRAVENOUS
  Administered 2012-10-19: 15:00:00 via INTRAVENOUS

## 2012-10-19 MED ORDER — NEOSTIGMINE METHYLSULFATE 1 MG/ML IJ SOLN
INTRAMUSCULAR | Status: DC | PRN
  Administered 2012-10-19: 3 mg via INTRAVENOUS

## 2012-10-19 MED ORDER — OXYCODONE HCL 5 MG/5ML PO SOLN
5.0000 mg | Freq: Once | ORAL | Status: DC | PRN
  Filled 2012-10-19: qty 5

## 2012-10-19 MED ORDER — BUPIVACAINE LIPOSOME 1.3 % IJ SUSP
20.0000 mL | Freq: Once | INTRAMUSCULAR | Status: AC
  Administered 2012-10-19: 7 mL
  Filled 2012-10-19: qty 20

## 2012-10-19 MED ORDER — ONDANSETRON HCL 4 MG PO TABS: 4.0000 mg | ORAL_TABLET | Freq: Four times a day (QID) | ORAL | Status: DC | PRN

## 2012-10-19 MED ORDER — METOCLOPRAMIDE HCL 5 MG/ML IJ SOLN: 10.0000 mg | Freq: Once | INTRAMUSCULAR | Status: DC | PRN

## 2012-10-19 MED ORDER — CEFAZOLIN SODIUM-DEXTROSE 2-3 GM-% IV SOLR
2.0000 g | INTRAVENOUS | Status: AC
  Administered 2012-10-19: 2 g via INTRAVENOUS

## 2012-10-19 MED ORDER — ACETAMINOPHEN 325 MG PO TABS: 650.0000 mg | ORAL_TABLET | ORAL | Status: DC | PRN

## 2012-10-19 MED ORDER — ROCURONIUM BROMIDE 100 MG/10ML IV SOLN
INTRAVENOUS | Status: DC | PRN
  Administered 2012-10-19: 40 mg via INTRAVENOUS
  Administered 2012-10-19: 10 mg via INTRAVENOUS

## 2012-10-19 MED ORDER — ACETAMINOPHEN 10 MG/ML IV SOLN
INTRAVENOUS | Status: DC | PRN
  Administered 2012-10-19: 1000 mg via INTRAVENOUS

## 2012-10-19 MED ORDER — ONDANSETRON HCL 4 MG/2ML IJ SOLN
4.0000 mg | Freq: Four times a day (QID) | INTRAMUSCULAR | Status: DC | PRN
  Administered 2012-10-19: 4 mg via INTRAVENOUS
  Filled 2012-10-19: qty 2

## 2012-10-19 SURGICAL SUPPLY — 46 items
APL SKNCLS STERI-STRIP NONHPOA (GAUZE/BANDAGES/DRESSINGS)
BALN DSCT LAPSCP LRG KNDY DSTN (BALLOONS)
BENZOIN TINCTURE PRP APPL 2/3 (GAUZE/BANDAGES/DRESSINGS) ×1 IMPLANT
CABLE HIGH FREQUENCY MONO STRZ (ELECTRODE) ×2 IMPLANT
CLOTH BEACON ORANGE TIMEOUT ST (SAFETY) ×2 IMPLANT
CLSR STERI-STRIP ANTIMIC 1/2X4 (GAUZE/BANDAGES/DRESSINGS) ×1 IMPLANT
COVER SURGICAL LIGHT HANDLE (MISCELLANEOUS) ×1 IMPLANT
DECANTER SPIKE VIAL GLASS SM (MISCELLANEOUS) ×1 IMPLANT
DEVICE SECURE STRAP 25 ABSORB (INSTRUMENTS) IMPLANT
DISSECT BALLN SPACEMKR + OVL (BALLOONS) ×2
DISSECT BALLN SPACEMKR OVL PDB (BALLOONS)
DISSECTOR BALLN SPACEMKR + OVL (BALLOONS) ×1 IMPLANT
DISSECTOR BALLN SPCMKR OVL PDB (BALLOONS) IMPLANT
DISSECTOR BLUNT TIP ENDO 5MM (MISCELLANEOUS) ×1 IMPLANT
DRAPE LAPAROSCOPIC ABDOMINAL (DRAPES) ×2 IMPLANT
DRSG TEGADERM 2-3/8X2-3/4 SM (GAUZE/BANDAGES/DRESSINGS) ×1 IMPLANT
ELECT REM PT RETURN 9FT ADLT (ELECTROSURGICAL) ×2
ELECTRODE REM PT RTRN 9FT ADLT (ELECTROSURGICAL) ×1 IMPLANT
GAUZE SPONGE 2X2 8PLY STRL LF (GAUZE/BANDAGES/DRESSINGS) IMPLANT
GLOVE BIOGEL M 8.0 STRL (GLOVE) ×2 IMPLANT
GLOVE BIOGEL PI IND STRL 7.0 (GLOVE) ×1 IMPLANT
GLOVE BIOGEL PI INDICATOR 7.0 (GLOVE) ×1
GOWN STRL NON-REIN LRG LVL3 (GOWN DISPOSABLE) ×2 IMPLANT
GOWN STRL REIN XL XLG (GOWN DISPOSABLE) ×4 IMPLANT
KIT BASIN OR (CUSTOM PROCEDURE TRAY) ×2 IMPLANT
MARKER SKIN DUAL TIP RULER LAB (MISCELLANEOUS) ×2 IMPLANT
MESH 3DMAX 4X6 LT LRG (Mesh General) ×1 IMPLANT
NDL INSUFFLATION 14GA 120MM (NEEDLE) IMPLANT
NEEDLE INSUFFLATION 14GA 120MM (NEEDLE) IMPLANT
NS IRRIG 1000ML POUR BTL (IV SOLUTION) ×1 IMPLANT
PENCIL BUTTON HOLSTER BLD 10FT (ELECTRODE) ×2 IMPLANT
SCISSORS LAP 5X35 DISP (ENDOMECHANICALS) ×1 IMPLANT
SET IRRIG TUBING LAPAROSCOPIC (IRRIGATION / IRRIGATOR) ×1 IMPLANT
SOLUTION ANTI FOG 6CC (MISCELLANEOUS) ×2 IMPLANT
SPONGE GAUZE 2X2 STER 10/PKG (GAUZE/BANDAGES/DRESSINGS) ×1
STRIP CLOSURE SKIN 1/2X4 (GAUZE/BANDAGES/DRESSINGS) ×1 IMPLANT
SUT VIC AB 3-0 SH 27 (SUTURE)
SUT VIC AB 3-0 SH 27XBRD (SUTURE) IMPLANT
SUT VIC AB 4-0 SH 18 (SUTURE) ×2 IMPLANT
SYR 30ML LL (SYRINGE) ×2 IMPLANT
TACKER 5MM HERNIA 3.5CML NAB (ENDOMECHANICALS) ×2 IMPLANT
TRAY FOLEY CATH 14FRSI W/METER (CATHETERS) ×2 IMPLANT
TRAY LAP CHOLE (CUSTOM PROCEDURE TRAY) ×2 IMPLANT
TROCAR BLADELESS OPT 5 75 (ENDOMECHANICALS) ×4 IMPLANT
TUBING FILTER THERMOFLATOR (ELECTROSURGICAL) ×2 IMPLANT
TUBING INSUFFLATION 10FT LAP (TUBING) ×2 IMPLANT

## 2012-10-19 NOTE — H&P (View-Only) (Signed)
Chief Complaint:  Left femoral hernia  History of Present Illness:  Gail Jennings is an 79 y.o. female patient of Ed Green's with a Femoral hernia that occurred following repair of a left indirect inguinal hernia. We discussed repair in timing. She is a very active lady in length to do yoga etc. We'll try to get this scheduled in March  and I think I would try a laparoscopic approach possible open. This likely this is a femoral hernia  Past Medical History  Diagnosis Date  . Inguinal hernia   . Complication of anesthesia     cannot take anectine  . No pertinent past medical history     Past Surgical History  Procedure Date  . Abdominal hysterectomy   . Tubal ligation   . Dental surgery Jan 11, 2011    bone graft and extraction  . Eye surgery     detached retina, cryo, lens implant  . Tonsillectomy   . Mouth surgery     lt upper bone graft for implants  . Inguinal hernia repair 09/27/2011    Procedure: HERNIA REPAIR INGUINAL INCARCERATED;  Surgeon: Jenin Birdsall B Satoru Milich, MD;  Location: [facility];  Service: General;  Laterality: Left;  Left Ingunial hernia repair with mesh    Current Outpatient Prescriptions  Medication Sig Dispense Refill  . hydrochlorothiazide (HYDRODIURIL) 25 MG tablet daily.      . PREMARIN 0.45 MG tablet        Codeine; Hydrocodone; and Succinylcholine chloride No family history on file. Social History:   reports that she quit smoking about 42 years ago. She does not have any smokeless tobacco history on file. She reports that she drinks about 1.2 ounces of alcohol per week. She reports that she does not use illicit drugs.   REVIEW OF SYSTEMS - PERTINENT POSITIVES ONLY: Noncontributory  Physical Exam:   Blood pressure 190/100, pulse 72, temperature 98 F (36.7 C), temperature source Temporal, resp. rate 16, height 5' 4.5" (1.638 m), weight 109 lb 3.2 oz (49.533 kg). Body mass index is 18.45 kg/(m^2).  Gen:  WDWN White female  NAD  Neurological: Alert and oriented to person, place, and time. Motor and sensory function is grossly intact  Head: Normocephalic and atraumatic.  Eyes: Conjunctivae are normal. Pupils are equal, round, and reactive to light. No scleral icterus.  Neck: Normal range of motion. Neck supple. No tracheal deviation or thyromegaly present.  Cardiovascular:  SR without murmurs or gallops.  No carotid bruits Respiratory: Effort normal.  No respiratory distress. No chest wall tenderness. Breath sounds normal.  No wheezes, rales or rhonchi.  Abdomen:  Small bulge below the left inguinal ligament consistent with a femoral hernia. GU: Musculoskeletal: Normal range of motion. Extremities are nontender. No cyanosis, edema or clubbing noted Lymphadenopathy: No cervical, preauricular, postauricular or axillary adenopathy is present Skin: Skin is warm and dry. No rash noted. No diaphoresis. No erythema. No pallor. Pscyh: Normal mood and affect. Behavior is normal. Judgment and thought content normal.   LABORATORY RESULTS: No results found for this or any previous visit (from the past 48 hour(s)).  RADIOLOGY RESULTS: No results found.  Problem List: Patient Active Problem List  Diagnosis  . Inguinal hernia, left  . Mass in left inguinal region-node v femoral hernia    Assessment & Plan: Left femoral hernia  Plan laparoscopic approach with possible modification to open procedure as needed to correct left femoral hernia.    Matt B. Lexxie Winberg, MD, FACS    [facility] [phone] beeper [phone]  09/28/2012 5:29 PM     

## 2012-10-19 NOTE — Anesthesia Postprocedure Evaluation (Signed)
  Anesthesia Post-op Note  Patient: Gail Jennings  Procedure(s) Performed: Procedure(s) (LRB): LAPAROSCOPIC possible open left femoral hernia repair  (Left)  Patient Location: PACU  Anesthesia Type: General  Level of Consciousness: awake and alert   Airway and Oxygen Therapy: Patient Spontanous Breathing  Post-op Pain: mild  Post-op Assessment: Post-op Vital signs reviewed, Patient's Cardiovascular Status Stable, Respiratory Function Stable, Patent Airway and No signs of Nausea or vomiting  Last Vitals:  Filed Vitals:   10/19/12 1630  BP: 153/67  Pulse: 65  Temp:   Resp: 15    Post-op Vital Signs: stable   Complications: No apparent anesthesia complications

## 2012-10-19 NOTE — Op Note (Signed)
Surgeon: Wenda Low, MD, FACS  Asst:  none  Anes:  general  Procedure: Laparoscopic preperitoneal repair of a left femoral hernia  Diagnosis: Left femoral hernia  Complications: none  EBL:   3 cc  Description of Procedure:  The patient was taken to OR 11 and given general anesthesia. The abdomen was prepped with PCMX and draped sterilely. She's had a previous lower transverse incision as well as a left inguinal incision. After a timeout made a small transverse incision beneath the umbilicus and retracted to see the anterior rectus sheath. I incised transversely and retracted the muscle laterally and inserted my fifth digit and bluntly dissected down to her pubis. I then inserted the balloon and inflated it partially to dissected pre-peroneal space. A she had a Foley in place and was careful because initially a lot of adhesions wouldn't blow up the balloon anywhere near to its capacity. I did however get a good dissection on the left side and palpating left femoral region could see the defect. We had removed the balloon and inserted a 5 mm or slightly below the camera port. That was inserted under direct vision. I did not see any more at the end of the peritoneum but it into the case noted her to have probably a little gas in her abdomen. I focused on the femoral triangle which was in view and could see the femoral vein. Lateral to that she had a lot of adhesions from her previous open left inguinal hernia and ligation of her sac. I noted the hernia though as I could see it and I began pulling it back and I got out this incarcerated preperitoneal sac leaving a small round hole in the femoral space. I elected to keep the hernia pulled down and out and cut a piece of 3-D max mesh making is significantly smaller so that it could occupied a small triangle into above the inguinal ligament I did tack it to the intra-abdominal wall. I was able to then get it along Cooper's ligament with 3 tacks and tacked  anteriorly. I pushed it back over the femoral vessels but did not attempt any lateral tacking. I held in place as I went through the pneumo-. It certainly covered the area and the actual incarcerated preperitoneal material was well away from the hernia.  The ports were injected with Exparel and the fascial defect was closed with 2-0 Vicryl. 4-0 Vicryl was used in the skin along with benzoin and Steri-Strips. Patient are the procedure well taken recovery in satisfactory condition. Should he kept for overnight observation.  Matt B. Daphine Deutscher, MD, [facility] [place] [phone]

## 2012-10-19 NOTE — Transfer of Care (Signed)
Immediate Anesthesia Transfer of Care Note  Patient: Gail Jennings  Procedure(s) Performed: Procedure(s) with comments: LAPAROSCOPIC possible open left femoral hernia repair  (Left) - Laparoscopic Left Femoral Hernia Repair  Patient Location: PACU  Anesthesia Type:General  Level of Consciousness: sedated  Airway & Oxygen Therapy: Patient Spontanous Breathing and Patient connected to face mask oxygen  Post-op Assessment: Report given to PACU RN and Post -op Vital signs reviewed and stable  Post vital signs: Reviewed and stable  Complications: No apparent anesthesia complications

## 2012-10-19 NOTE — Interval H&P Note (Signed)
History and Physical Interval Note:  10/19/2012 10:10 AM  Gail Jennings  has presented today for surgery, with the diagnosis of left femoral hernia  The various methods of treatment have been discussed with the patient and family. After consideration of risks, benefits and other options for treatment, the patient has consented to  Procedure(s) with comments: LAPAROSCOPIC possible open left femoral hernia repair  (Left) - Lap Poss Open Left Femoral Hernia Repair as a surgical intervention .  The patient's history has been reviewed, patient examined, no change in status, stable for surgery.  I have reviewed the patient's chart and labs.  Questions were answered to the patient's satisfaction.     Kysean Sweet B

## 2012-10-19 NOTE — Preoperative (Signed)
Beta Blockers   Reason not to administer Beta Blockers:Not Applicable 

## 2012-10-19 NOTE — Anesthesia Preprocedure Evaluation (Addendum)
Anesthesia Evaluation  Patient identified by MRN, date of birth, ID band Patient awake    Reviewed: Allergy & Precautions, H&P , NPO status , Patient's Chart, lab work & pertinent test results, reviewed documented beta blocker date and time   History of Anesthesia Complications (+) PSEUDOCHOLINESTERASE DEFICIENCY  Airway Mallampati: II TM Distance: >3 FB Neck ROM: full    Dental   Pulmonary neg pulmonary ROS,  breath sounds clear to auscultation        Cardiovascular negative cardio ROS  Rhythm:regular     Neuro/Psych negative neurological ROS  negative psych ROS   GI/Hepatic negative GI ROS, Neg liver ROS,   Endo/Other  negative endocrine ROS  Renal/GU negative Renal ROS  negative genitourinary   Musculoskeletal   Abdominal   Peds  Hematology negative hematology ROS (+)   Anesthesia Other Findings See surgeon's H&P   Reproductive/Obstetrics negative OB ROS                           Anesthesia Physical Anesthesia Plan  ASA: II  Anesthesia Plan: General   Post-op Pain Management:    Induction: Intravenous  Airway Management Planned: Oral ETT  Additional Equipment:   Intra-op Plan:   Post-operative Plan: Extubation in OR  Informed Consent: I have reviewed the patients History and Physical, chart, labs and discussed the procedure including the risks, benefits and alternatives for the proposed anesthesia with the patient or authorized representative who has indicated his/her understanding and acceptance.   Dental Advisory Given  Plan Discussed with: CRNA and Surgeon  Anesthesia Plan Comments:        Anesthesia Quick Evaluation

## 2012-10-20 LAB — CBC
HCT: 33.1 % — ABNORMAL LOW (ref 36.0–46.0)
MCHC: 34.7 g/dL (ref 30.0–36.0)
MCV: 96.5 fL (ref 78.0–100.0)
RDW: 12.9 % (ref 11.5–15.5)

## 2012-10-20 MED ORDER — ACETAMINOPHEN 325 MG PO TABS: 650.0000 mg | ORAL_TABLET | ORAL | Status: DC | PRN

## 2012-10-20 NOTE — Care Management Note (Addendum)
    Page 1 of 1   10/20/2012     3:42:29 PM   CARE MANAGEMENT NOTE 10/20/2012  Patient:  Gail Jennings, Gail Jennings   Account Number:  [id number]  Date Initiated:  10/20/2012  Documentation initiated by:  Lanier Clam  Subjective/Objective Assessment:   ADMITTED W/L FEMORAL HERNIA.     Action/Plan:   FROM HOME.HAS PCP,PHARMACY.   Anticipated DC Date:  10/20/2012   Anticipated DC Plan:  HOME/SELF CARE      DC Planning Services  CM consult      Choice offered to / List presented to:             Status of service:  Completed, signed off Medicare Important Message given?   (If response is "NO", the following Medicare IM given date fields will be blank) Date Medicare IM given:   Date Additional Medicare IM given:    Discharge Disposition:  HOME/SELF CARE  Per UR Regulation:    If discussed at Long Length of Stay Meetings, dates discussed:    Comments:  10/20/12 Osie Amparo RN,BSN NCM [phone] S/P LAP REPAIR L FEMORAL HERNIA.D/C HOME NO NEEDS, OR ORDERS.

## 2012-10-20 NOTE — Discharge Summary (Signed)
   Patient ID: Gail Jennings [id number] 79 y.o. 06-Dec-1932  10/19/2012  Discharge date and time: 10/20/2012   Admitting Physician: Luretha Murphy  Discharge Physician: Glenna Fellows T  Admission Diagnoses: lerft femoral hernia  Discharge Diagnoses: Same  Operations: Procedure(s): LAPAROSCOPIC possible open left femoral hernia repair   Admission Condition: good  Discharged Condition: good  Indication for Admission: patient electively admitted for overnight observation following plan laparoscopic repair of a left femoral hernia  Hospital Course: patient underwent laparoscopic repair of her left femoral hernia by Dr. Daphine Deutscher. She was observed overnight. The following morning her vital signs are stable. She has minimal discomfort. Abdomen is soft and nontender. White count is normal. She is comfortable going home and is ready for discharge.   Disposition: Home  Patient Instructions:    Medication List    TAKE these medications       acetaminophen 325 MG tablet  Commonly known as:  TYLENOL  Take 2 tablets (650 mg total) by mouth every 4 (four) hours as needed.     CALCIUM 600 + D PO  Take 1 tablet by mouth daily.     docusate sodium 250 MG capsule  Commonly known as:  COLACE  Take 250 mg by mouth daily.     hydrochlorothiazide 25 MG tablet  Commonly known as:  HYDRODIURIL  Take 25 mg by mouth daily before breakfast.     ibuprofen 200 MG tablet  Commonly known as:  ADVIL,MOTRIN  Take 200 mg by mouth every 6 (six) hours as needed. Pain     Magnesium 250 MG Tabs  Take 1 tablet by mouth daily.     polyethylene glycol packet  Commonly known as:  MIRALAX / GLYCOLAX  Take 17 g by mouth daily.     PREMARIN 0.45 MG tablet  Generic drug:  estrogens (conjugated)  Take 0.45 mg by mouth daily.        Activity: activity as tolerated Diet: regular diet Wound Care: none needed  Follow-up:  With Dr. Daphine Deutscher in 5 weeks.  Signed: Mariella Saa MD,  FACS  10/20/2012, 8:53 AM

## 2012-10-22 ENCOUNTER — Encounter (HOSPITAL_COMMUNITY): Payer: Self-pay | Admitting: Surgery

## 2012-11-22 ENCOUNTER — Encounter (INDEPENDENT_AMBULATORY_CARE_PROVIDER_SITE_OTHER): Payer: Self-pay | Admitting: Surgery

## 2012-11-22 ENCOUNTER — Ambulatory Visit (INDEPENDENT_AMBULATORY_CARE_PROVIDER_SITE_OTHER): Payer: Medicare Other | Admitting: Surgery

## 2012-11-22 VITALS — BP 148/88 | HR 76 | Temp 98.0°F | Resp 14 | Ht 64.5 in | Wt 108.6 lb

## 2012-11-22 DIAGNOSIS — K4190 Unilateral femoral hernia, without obstruction or gangrene, not specified as recurrent: Secondary | ICD-10-CM

## 2012-11-22 NOTE — Progress Notes (Signed)
Gail Jennings 79 y.o.  Body mass index is 18.36 kg/(m^2).  Patient Active Problem List  Diagnosis  . History of left indirect inguinal hernia and lap repair left femoral hernia    Allergies  Allergen Reactions  . Codeine Nausea And Vomiting  . Hydrocodone Nausea And Vomiting  . Succinylcholine Chloride     Prolonged paralysis    Past Surgical History  Procedure Laterality Date  . Abdominal hysterectomy    . Tubal ligation    . Dental surgery  Jan 11, 2011    bone graft and extraction  . Eye surgery      detached retina, cryo, lens implant  . Tonsillectomy    . Mouth surgery      lt upper bone graft for implants  . Inguinal hernia repair  09/27/2011    Procedure: HERNIA REPAIR INGUINAL INCARCERATED;  Surgeon: Valarie Merino, MD;  Location: [facility];  Service: General;  Laterality: Left;  Left Ingunial hernia repair with mesh  . Inguinal hernia repair Left 10/19/2012    Procedure: LAPAROSCOPIC possible open left femoral hernia repair ;  Surgeon: Valarie Merino, MD;  Location: [facility];  Service: General;  Laterality: Left;  Laparoscopic Left Femoral Hernia Repair with Mesh   GREEN, [name], MD 1. History of left indirect inguinal hernia and lap repair left femoral hernia     Gail Jennings is getting along very well after her laparoscopic left femoral hernia repair. She had a large fat mass that was incarcerated and blocking the femoral canal. I reduce that and placed a he says mesh in a preperitoneal position to block the a defect. She's gotten along very well and is back at the gym. I told her I be happy to see her again whenever needed. Return when necessary Matt B. Daphine Deutscher, MD, [facility] [phone] beeper [phone]  11/22/2012 11:10 AM

## 2012-11-22 NOTE — Patient Instructions (Signed)
Thanks for your patience.  If you need further assistance after leaving the office, please call our office and speak with a [facility] nurse.  [phone].  If you want to leave a message for Dr. Karolee Meloni, please call his office phone at [phone]. 

## 2013-05-27 DIAGNOSIS — Z0000 Encounter for general adult medical examination without abnormal findings: Secondary | ICD-10-CM | POA: Diagnosis not present

## 2013-07-15 ENCOUNTER — Other Ambulatory Visit: Payer: Self-pay | Admitting: Internal Medicine

## 2013-07-15 ENCOUNTER — Other Ambulatory Visit: Payer: Self-pay

## 2013-07-15 DIAGNOSIS — Z1231 Encounter for screening mammogram for malignant neoplasm of breast: Secondary | ICD-10-CM

## 2013-07-15 DIAGNOSIS — M810 Age-related osteoporosis without current pathological fracture: Secondary | ICD-10-CM

## 2013-08-14 ENCOUNTER — Ambulatory Visit
Admission: RE | Admit: 2013-08-14 | Discharge: 2013-08-14 | Disposition: A | Discharge status: Home / Self Care | Payer: Medicare Other | Source: Ambulatory Visit

## 2013-08-14 ENCOUNTER — Ambulatory Visit
Admission: RE | Admit: 2013-08-14 | Discharge: 2013-08-14 | Disposition: A | Discharge status: Home / Self Care | Payer: Medicare Other | Source: Ambulatory Visit | Attending: Internal Medicine | Admitting: Internal Medicine

## 2013-08-14 DIAGNOSIS — M810 Age-related osteoporosis without current pathological fracture: Secondary | ICD-10-CM

## 2013-08-14 DIAGNOSIS — Z1231 Encounter for screening mammogram for malignant neoplasm of breast: Secondary | ICD-10-CM

## 2013-12-03 DIAGNOSIS — I10 Essential (primary) hypertension: Secondary | ICD-10-CM | POA: Diagnosis not present

## 2014-01-09 DIAGNOSIS — Z961 Presence of intraocular lens: Secondary | ICD-10-CM | POA: Diagnosis not present

## 2014-01-09 DIAGNOSIS — Z9889 Other specified postprocedural states: Secondary | ICD-10-CM | POA: Diagnosis not present

## 2014-01-09 DIAGNOSIS — H33009 Unspecified retinal detachment with retinal break, unspecified eye: Secondary | ICD-10-CM | POA: Diagnosis not present

## 2014-01-09 DIAGNOSIS — H2510 Age-related nuclear cataract, unspecified eye: Secondary | ICD-10-CM | POA: Diagnosis not present

## 2014-05-29 DIAGNOSIS — Z0000 Encounter for general adult medical examination without abnormal findings: Secondary | ICD-10-CM | POA: Diagnosis not present

## 2014-05-29 DIAGNOSIS — E7800 Pure hypercholesterolemia, unspecified: Secondary | ICD-10-CM | POA: Diagnosis not present

## 2014-05-29 DIAGNOSIS — I10 Essential (primary) hypertension: Secondary | ICD-10-CM | POA: Diagnosis not present

## 2014-05-29 DIAGNOSIS — Z79899 Other long term (current) drug therapy: Secondary | ICD-10-CM | POA: Diagnosis not present

## 2014-05-29 DIAGNOSIS — Z23 Encounter for immunization: Secondary | ICD-10-CM | POA: Diagnosis not present

## 2014-07-25 DIAGNOSIS — I10 Essential (primary) hypertension: Secondary | ICD-10-CM | POA: Diagnosis not present

## 2014-07-29 ENCOUNTER — Other Ambulatory Visit: Payer: Self-pay

## 2014-07-29 DIAGNOSIS — Z1231 Encounter for screening mammogram for malignant neoplasm of breast: Secondary | ICD-10-CM

## 2014-08-15 ENCOUNTER — Ambulatory Visit
Admission: RE | Admit: 2014-08-15 | Discharge: 2014-08-15 | Disposition: A | Discharge status: Home / Self Care | Payer: Medicare Other | Source: Ambulatory Visit

## 2014-08-15 DIAGNOSIS — Z1231 Encounter for screening mammogram for malignant neoplasm of breast: Secondary | ICD-10-CM

## 2014-12-04 DIAGNOSIS — H33002 Unspecified retinal detachment with retinal break, left eye: Secondary | ICD-10-CM | POA: Diagnosis not present

## 2014-12-04 DIAGNOSIS — Z961 Presence of intraocular lens: Secondary | ICD-10-CM | POA: Diagnosis not present

## 2014-12-04 DIAGNOSIS — H2512 Age-related nuclear cataract, left eye: Secondary | ICD-10-CM | POA: Diagnosis not present

## 2014-12-04 DIAGNOSIS — Z9889 Other specified postprocedural states: Secondary | ICD-10-CM | POA: Diagnosis not present

## 2015-01-29 DIAGNOSIS — I10 Essential (primary) hypertension: Secondary | ICD-10-CM | POA: Diagnosis not present

## 2015-02-05 DIAGNOSIS — H00011 Hordeolum externum right upper eyelid: Secondary | ICD-10-CM | POA: Diagnosis not present

## 2015-02-19 DIAGNOSIS — H00011 Hordeolum externum right upper eyelid: Secondary | ICD-10-CM | POA: Diagnosis not present

## 2015-03-11 DIAGNOSIS — H00011 Hordeolum externum right upper eyelid: Secondary | ICD-10-CM | POA: Diagnosis not present

## 2015-03-11 DIAGNOSIS — H00014 Hordeolum externum left upper eyelid: Secondary | ICD-10-CM | POA: Diagnosis not present

## 2015-03-13 DIAGNOSIS — I10 Essential (primary) hypertension: Secondary | ICD-10-CM | POA: Diagnosis not present

## 2015-06-25 DIAGNOSIS — I10 Essential (primary) hypertension: Secondary | ICD-10-CM | POA: Diagnosis not present

## 2015-06-25 DIAGNOSIS — Z0000 Encounter for general adult medical examination without abnormal findings: Secondary | ICD-10-CM | POA: Diagnosis not present

## 2015-06-25 DIAGNOSIS — E785 Hyperlipidemia, unspecified: Secondary | ICD-10-CM | POA: Diagnosis not present

## 2015-06-25 DIAGNOSIS — Z23 Encounter for immunization: Secondary | ICD-10-CM | POA: Diagnosis not present

## 2015-06-25 DIAGNOSIS — D559 Anemia due to enzyme disorder, unspecified: Secondary | ICD-10-CM | POA: Diagnosis not present

## 2015-06-30 DIAGNOSIS — R040 Epistaxis: Secondary | ICD-10-CM | POA: Diagnosis not present

## 2015-06-30 DIAGNOSIS — I10 Essential (primary) hypertension: Secondary | ICD-10-CM | POA: Diagnosis not present

## 2015-09-28 DIAGNOSIS — M859 Disorder of bone density and structure, unspecified: Secondary | ICD-10-CM | POA: Diagnosis not present

## 2015-09-28 DIAGNOSIS — I10 Essential (primary) hypertension: Secondary | ICD-10-CM | POA: Diagnosis not present

## 2015-10-08 DIAGNOSIS — H00011 Hordeolum externum right upper eyelid: Secondary | ICD-10-CM | POA: Diagnosis not present

## 2015-10-08 DIAGNOSIS — Z9889 Other specified postprocedural states: Secondary | ICD-10-CM | POA: Diagnosis not present

## 2015-10-08 DIAGNOSIS — Z8669 Personal history of other diseases of the nervous system and sense organs: Secondary | ICD-10-CM | POA: Diagnosis not present

## 2015-10-08 DIAGNOSIS — Z961 Presence of intraocular lens: Secondary | ICD-10-CM | POA: Diagnosis not present

## 2015-10-08 DIAGNOSIS — H2512 Age-related nuclear cataract, left eye: Secondary | ICD-10-CM | POA: Diagnosis not present

## 2015-10-08 DIAGNOSIS — H33302 Unspecified retinal break, left eye: Secondary | ICD-10-CM | POA: Diagnosis not present

## 2015-11-10 ENCOUNTER — Other Ambulatory Visit: Payer: Self-pay

## 2015-11-10 DIAGNOSIS — Z1231 Encounter for screening mammogram for malignant neoplasm of breast: Secondary | ICD-10-CM

## 2015-11-12 ENCOUNTER — Other Ambulatory Visit: Payer: Self-pay | Admitting: Internal Medicine

## 2015-11-12 DIAGNOSIS — M8580 Other specified disorders of bone density and structure, unspecified site: Secondary | ICD-10-CM

## 2015-11-26 ENCOUNTER — Ambulatory Visit
Admission: RE | Admit: 2015-11-26 | Discharge: 2015-11-26 | Disposition: A | Discharge status: Home / Self Care | Payer: Medicare Other | Source: Ambulatory Visit

## 2015-11-26 DIAGNOSIS — Z1231 Encounter for screening mammogram for malignant neoplasm of breast: Secondary | ICD-10-CM

## 2015-12-30 ENCOUNTER — Other Ambulatory Visit: Payer: Medicare Other

## 2016-01-15 ENCOUNTER — Ambulatory Visit
Admission: RE | Admit: 2016-01-15 | Discharge: 2016-01-15 | Disposition: A | Discharge status: Home / Self Care | Payer: Medicare Other | Source: Ambulatory Visit | Attending: Internal Medicine | Admitting: Internal Medicine

## 2016-01-15 DIAGNOSIS — Z780 Asymptomatic menopausal state: Secondary | ICD-10-CM | POA: Diagnosis not present

## 2016-01-15 DIAGNOSIS — M8580 Other specified disorders of bone density and structure, unspecified site: Secondary | ICD-10-CM

## 2016-01-15 DIAGNOSIS — M85851 Other specified disorders of bone density and structure, right thigh: Secondary | ICD-10-CM | POA: Diagnosis not present

## 2016-06-27 DIAGNOSIS — E7800 Pure hypercholesterolemia, unspecified: Secondary | ICD-10-CM | POA: Diagnosis not present

## 2016-06-27 DIAGNOSIS — D559 Anemia due to enzyme disorder, unspecified: Secondary | ICD-10-CM | POA: Diagnosis not present

## 2016-07-07 DIAGNOSIS — Z9889 Other specified postprocedural states: Secondary | ICD-10-CM | POA: Diagnosis not present

## 2016-07-07 DIAGNOSIS — Z8669 Personal history of other diseases of the nervous system and sense organs: Secondary | ICD-10-CM | POA: Diagnosis not present

## 2016-07-07 DIAGNOSIS — H25812 Combined forms of age-related cataract, left eye: Secondary | ICD-10-CM | POA: Diagnosis not present

## 2016-07-07 DIAGNOSIS — Z961 Presence of intraocular lens: Secondary | ICD-10-CM | POA: Diagnosis not present

## 2016-07-07 DIAGNOSIS — H33312 Horseshoe tear of retina without detachment, left eye: Secondary | ICD-10-CM | POA: Diagnosis not present

## 2016-12-26 DIAGNOSIS — M545 Low back pain: Secondary | ICD-10-CM | POA: Diagnosis not present

## 2016-12-26 DIAGNOSIS — I10 Essential (primary) hypertension: Secondary | ICD-10-CM | POA: Diagnosis not present

## 2016-12-26 DIAGNOSIS — H6060 Unspecified chronic otitis externa, unspecified ear: Secondary | ICD-10-CM | POA: Diagnosis not present

## 2016-12-30 DIAGNOSIS — H6090 Unspecified otitis externa, unspecified ear: Secondary | ICD-10-CM | POA: Diagnosis not present

## 2016-12-30 DIAGNOSIS — H6123 Impacted cerumen, bilateral: Secondary | ICD-10-CM | POA: Diagnosis not present

## 2017-01-05 DIAGNOSIS — H6060 Unspecified chronic otitis externa, unspecified ear: Secondary | ICD-10-CM | POA: Diagnosis not present

## 2017-01-18 DIAGNOSIS — Z9889 Other specified postprocedural states: Secondary | ICD-10-CM | POA: Diagnosis not present

## 2017-01-18 DIAGNOSIS — H2512 Age-related nuclear cataract, left eye: Secondary | ICD-10-CM | POA: Diagnosis not present

## 2017-01-18 DIAGNOSIS — Z8669 Personal history of other diseases of the nervous system and sense organs: Secondary | ICD-10-CM | POA: Diagnosis not present

## 2017-01-18 DIAGNOSIS — Z961 Presence of intraocular lens: Secondary | ICD-10-CM | POA: Diagnosis not present

## 2017-01-18 DIAGNOSIS — H26491 Other secondary cataract, right eye: Secondary | ICD-10-CM | POA: Diagnosis not present

## 2017-03-24 DIAGNOSIS — H26491 Other secondary cataract, right eye: Secondary | ICD-10-CM | POA: Diagnosis not present

## 2017-05-02 ENCOUNTER — Other Ambulatory Visit: Payer: Self-pay | Admitting: Internal Medicine

## 2017-05-02 DIAGNOSIS — Z1231 Encounter for screening mammogram for malignant neoplasm of breast: Secondary | ICD-10-CM

## 2017-05-12 ENCOUNTER — Ambulatory Visit
Admission: RE | Admit: 2017-05-12 | Discharge: 2017-05-12 | Disposition: A | Discharge status: Home / Self Care | Payer: Medicare Other | Source: Ambulatory Visit | Attending: Internal Medicine | Admitting: Internal Medicine

## 2017-05-12 DIAGNOSIS — Z1231 Encounter for screening mammogram for malignant neoplasm of breast: Secondary | ICD-10-CM

## 2017-05-13 ENCOUNTER — Emergency Department (HOSPITAL_COMMUNITY): Payer: Medicare Other

## 2017-05-13 ENCOUNTER — Emergency Department (HOSPITAL_COMMUNITY)
Admission: EM | Admit: 2017-05-13 | Discharge: 2017-05-13 | Disposition: A | Discharge status: Home / Self Care | Payer: Medicare Other | Attending: Emergency Medicine | Admitting: Emergency Medicine

## 2017-05-13 ENCOUNTER — Encounter (HOSPITAL_COMMUNITY): Payer: Self-pay | Admitting: Emergency Medicine

## 2017-05-13 DIAGNOSIS — Y999 Unspecified external cause status: Secondary | ICD-10-CM | POA: Insufficient documentation

## 2017-05-13 DIAGNOSIS — Y93H2 Activity, gardening and landscaping: Secondary | ICD-10-CM | POA: Insufficient documentation

## 2017-05-13 DIAGNOSIS — S90511A Abrasion, right ankle, initial encounter: Secondary | ICD-10-CM | POA: Diagnosis not present

## 2017-05-13 DIAGNOSIS — Y92007 Garden or yard of unspecified non-institutional (private) residence as the place of occurrence of the external cause: Secondary | ICD-10-CM | POA: Insufficient documentation

## 2017-05-13 DIAGNOSIS — Z043 Encounter for examination and observation following other accident: Secondary | ICD-10-CM | POA: Diagnosis present

## 2017-05-13 DIAGNOSIS — M79671 Pain in right foot: Secondary | ICD-10-CM | POA: Diagnosis not present

## 2017-05-13 DIAGNOSIS — W19XXXA Unspecified fall, initial encounter: Secondary | ICD-10-CM

## 2017-05-13 DIAGNOSIS — M25571 Pain in right ankle and joints of right foot: Secondary | ICD-10-CM

## 2017-05-13 DIAGNOSIS — S299XXA Unspecified injury of thorax, initial encounter: Secondary | ICD-10-CM | POA: Diagnosis not present

## 2017-05-13 DIAGNOSIS — R0781 Pleurodynia: Secondary | ICD-10-CM | POA: Diagnosis not present

## 2017-05-13 DIAGNOSIS — W01198A Fall on same level from slipping, tripping and stumbling with subsequent striking against other object, initial encounter: Secondary | ICD-10-CM | POA: Insufficient documentation

## 2017-05-13 DIAGNOSIS — M25551 Pain in right hip: Secondary | ICD-10-CM

## 2017-05-13 DIAGNOSIS — R0789 Other chest pain: Secondary | ICD-10-CM

## 2017-05-13 DIAGNOSIS — S79911A Unspecified injury of right hip, initial encounter: Secondary | ICD-10-CM | POA: Diagnosis not present

## 2017-05-13 DIAGNOSIS — Z87891 Personal history of nicotine dependence: Secondary | ICD-10-CM | POA: Insufficient documentation

## 2017-05-13 DIAGNOSIS — S70211A Abrasion, right hip, initial encounter: Secondary | ICD-10-CM | POA: Insufficient documentation

## 2017-05-13 MED ORDER — LIDOCAINE 5 % EX PTCH: 1.0000 | MEDICATED_PATCH | CUTANEOUS | 0 refills | Status: DC

## 2017-05-13 NOTE — ED Triage Notes (Signed)
Pt fell today on her R side onto pot. Pt complains of pain on her R lower ribs and hip from fall. Also has abrasion on R ankle.

## 2017-05-13 NOTE — Discharge Instructions (Signed)

## 2017-05-13 NOTE — ED Provider Notes (Signed)
Emergency Department Provider Note   I have reviewed the triage vital signs and the nursing notes.   HISTORY  Chief Complaint Fall and Flank Pain   HPI Gail Jennings is a 83 y.o. female presents to the emergency room in for evaluation after mechanical fall. The patient was gardening and winding up along hose when she suddenly fell to the right side and landed on a box which was nearby. She is experiencing right hip and right lateral chest wall pain. She was able to stand up on her own and go back to her apartment where she called a friend to tell her she fallen. She denies any head trauma, loss of consciousness, confusion, vomiting since the incident. She had some mild shortness of breath immediately after falling but that seems to resolved. No pain in the arms or legs. No numbness or tingling. No CP, SOB, palpitations, or lightheadedness prior to falling.    Past Medical History:  Diagnosis Date  . Complication of anesthesia    cannot take anectine  . Inguinal hernia   . No pertinent past medical history     Patient Active Problem List   Diagnosis Date Noted  . History of left indirect inguinal hernia and lap repair left femoral hernia 11/22/2012    Past Surgical History:  Procedure Laterality Date  . ABDOMINAL HYSTERECTOMY    . DENTAL SURGERY  Jan 11, 2011   bone graft and extraction  . EYE SURGERY     detached retina, cryo, lens implant  . INGUINAL HERNIA REPAIR  09/27/2011   Procedure: HERNIA REPAIR INGUINAL INCARCERATED;  Surgeon: Pedro Earls, MD;  Location: [facility];  Service: General;  Laterality: Left;  Left Ingunial hernia repair with mesh  . INGUINAL HERNIA REPAIR Left 10/19/2012   Procedure: LAPAROSCOPIC possible open left femoral hernia repair ;  Surgeon: Pedro Earls, MD;  Location: [facility];  Service: General;  Laterality: Left;  Laparoscopic Left Femoral Hernia Repair with Mesh  . MOUTH SURGERY     lt upper bone graft for implants    . TONSILLECTOMY    . TUBAL LIGATION      Current Outpatient Rx  . Order #: [id number] Class: Normal  . Order #: [id number] Class: Historical Med  . Order #: [id number] Class: Historical Med  . Order #: [id number] Class: Historical Med  . Order #: [id number] Class: Historical Med  . Order #: [id number] Class: Print  . Order #: [id number] Class: Historical Med  . Order #: [id number] Class: Historical Med  . Order #: [id number] Class: Historical Med    Allergies Codeine; Hydrocodone; and Succinylcholine chloride  History reviewed. No pertinent family history.  Social History Social History  Substance Use Topics  . Smoking status: Former Smoker    Quit date: 09/21/1970  . Smokeless tobacco: Not on file  . Alcohol use 1.2 oz/week    2 Glasses of wine per week     Comment: daily    Review of Systems  Constitutional: No fever/chills Eyes: No visual changes. ENT: No sore throat. Cardiovascular: Denies chest pain. Respiratory: Denies shortness of breath. Gastrointestinal: No abdominal pain.  No nausea, no vomiting.  No diarrhea.  No constipation. Genitourinary: Negative for dysuria. Musculoskeletal: Negative for back pain. Positive right hip and chest wall pain.  Skin: Negative for rash. Neurological: Negative for headaches, focal weakness or numbness.  10-point ROS otherwise negative.  ____________________________________________   PHYSICAL EXAM:  VITAL SIGNS: ED Triage Vitals [05/13/17 1512]  Enc Vitals Group  BP (!) 153/124     Pulse Rate 90     Resp 16     Temp (!) 97.5 F (36.4 C)     Temp Source Oral     SpO2 96 %   Constitutional: Alert and oriented. Well appearing and in no acute distress. Eyes: Conjunctivae are normal. PERRL. Head: Atraumatic. Nose: No congestion/rhinnorhea. Mouth/Throat: Mucous membranes are moist.  Neck: No stridor. No cervical spine tenderness to palpation. Cardiovascular: Normal rate, regular rhythm. Good peripheral circulation. Grossly normal heart  sounds.   Respiratory: Normal respiratory effort.  No retractions. Lungs CTAB. Gastrointestinal: Soft and nontender. No distention.  Musculoskeletal: No lower extremity tenderness nor edema. No gross deformities of extremities. Tenderness to palpation over the right lateral hip and right lateral chest wall.  Neurologic:  Normal speech and language. No gross focal neurologic deficits are appreciated. Ambulatory without difficulty.  Skin:  Skin is warm and dry. Abrasion to the right lateral hip. Slight abrasion over the right lateral ankle.    ____________________________________________  RADIOLOGY  Dg Ribs Unilateral W/chest Right  Result Date: 05/13/2017 CLINICAL DATA:  Right lower rib pain after fall. EXAM: RIGHT RIBS AND CHEST - 3+ VIEW COMPARISON:  None. FINDINGS: Single view of the chest and two views of the right ribs are provided. Heart size and mediastinal contours are within normal limits. Lungs are clear. No pleural effusion or pneumothorax seen. Osseous structures about the chest are unremarkable. No right-sided rib fracture or displacement seen. IMPRESSION: Negative.  No rib fracture seen.  Lungs are clear. Electronically Signed   By: Franki Cabot M.D.   On: 05/13/2017 15:49   Dg Hip Unilat  With Pelvis 2-3 Views Right  Result Date: 05/13/2017 CLINICAL DATA:  [place] in garden today. Right hip pain. Initial encounter. EXAM: DG HIP (WITH OR WITHOUT PELVIS) 2-3V RIGHT COMPARISON:  CT pelvis 08/10/2011 FINDINGS: Old healed fractures are again noted involving the right superior and inferior pubic rami. No acute fracture or dislocation is identified. Right hip joint space width is preserved. There is mild left hip joint space narrowing. IMPRESSION: 1. No evidence acute osseous abnormality. 2. Old right superior and inferior pubic rami fractures. Electronically Signed   By: Logan Bores M.D.   On: 05/13/2017 15:50    ____________________________________________   PROCEDURES  Procedure(s)  performed:   Procedures  None ____________________________________________   INITIAL IMPRESSION / ASSESSMENT AND PLAN / ED COURSE  Pertinent labs & imaging results that were available during my care of the patient were reviewed by me and considered in my medical decision making (see chart for details).  Patient resents to the emergency room in for evaluation after mechanical fall t home today. She has an abrasion to the right lateral hip and tenderness to right lateral chest wall. No chest wall bruising or crepitus. Lungs are clear. X-ray with no obvious rib fracture, pneumothorax. X-ray of right hip unremarkable. Patient is ambulatory without difficulty. She does have a slight abrasion to the right ankle with minimal swelling. She is walking around with no problem. No x-ray at this time of the ankle.   At this time, I do not feel there is any life-threatening condition present. I have reviewed and discussed all results (EKG, imaging, lab, urine as appropriate), exam findings with patient. I have reviewed nursing notes and appropriate previous records.  I feel the patient is safe to be discharged home without further emergent workup. Discussed usual and customary return precautions. Patient and family (if present) verbalize  understanding and are comfortable with this plan.  Patient will follow-up with their primary care provider. If they do not have a primary care provider, information for follow-up has been provided to them. All questions have been answered.  ____________________________________________  FINAL CLINICAL IMPRESSION(S) / ED DIAGNOSES  Final diagnoses:  Fall, initial encounter  Right-sided chest wall pain  Right hip pain  Acute right ankle pain     MEDICATIONS GIVEN DURING THIS VISIT:  Medications - No data to display   NEW OUTPATIENT MEDICATIONS STARTED DURING THIS VISIT:  Discharge Medication List as of 05/13/2017  7:05 PM    START taking these medications    Details  lidocaine (LIDODERM) 5 % Place 1 patch onto the skin daily. Remove & Discard patch within 12 hours or as directed by MD, Starting Sat 05/13/2017, Print        Note:  This document was prepared using Dragon voice recognition software and may include unintentional dictation errors.  Nanda Quinton, MD Emergency Medicine    Kiasia Chou, [facility], MD 05/14/17 [phone]

## 2017-06-29 DIAGNOSIS — I10 Essential (primary) hypertension: Secondary | ICD-10-CM | POA: Diagnosis not present

## 2017-06-29 DIAGNOSIS — Z23 Encounter for immunization: Secondary | ICD-10-CM | POA: Diagnosis not present

## 2017-06-29 DIAGNOSIS — Z1211 Encounter for screening for malignant neoplasm of colon: Secondary | ICD-10-CM | POA: Diagnosis not present

## 2017-10-05 DIAGNOSIS — H33312 Horseshoe tear of retina without detachment, left eye: Secondary | ICD-10-CM | POA: Diagnosis not present

## 2017-10-05 DIAGNOSIS — Z8669 Personal history of other diseases of the nervous system and sense organs: Secondary | ICD-10-CM | POA: Diagnosis not present

## 2017-10-05 DIAGNOSIS — Z961 Presence of intraocular lens: Secondary | ICD-10-CM | POA: Diagnosis not present

## 2017-10-05 DIAGNOSIS — H25812 Combined forms of age-related cataract, left eye: Secondary | ICD-10-CM | POA: Diagnosis not present

## 2017-10-20 ENCOUNTER — Emergency Department (HOSPITAL_COMMUNITY)
Admission: EM | Admit: 2017-10-20 | Discharge: 2017-10-20 | Disposition: A | Discharge status: Home / Self Care | Payer: Medicare Other | Attending: Emergency Medicine | Admitting: Emergency Medicine

## 2017-10-20 ENCOUNTER — Encounter (HOSPITAL_COMMUNITY): Payer: Self-pay | Admitting: Emergency Medicine

## 2017-10-20 ENCOUNTER — Other Ambulatory Visit: Payer: Self-pay

## 2017-10-20 DIAGNOSIS — W2209XA Striking against other stationary object, initial encounter: Secondary | ICD-10-CM | POA: Diagnosis not present

## 2017-10-20 DIAGNOSIS — Y92009 Unspecified place in unspecified non-institutional (private) residence as the place of occurrence of the external cause: Secondary | ICD-10-CM | POA: Insufficient documentation

## 2017-10-20 DIAGNOSIS — Z23 Encounter for immunization: Secondary | ICD-10-CM | POA: Insufficient documentation

## 2017-10-20 DIAGNOSIS — Y939 Activity, unspecified: Secondary | ICD-10-CM | POA: Diagnosis not present

## 2017-10-20 DIAGNOSIS — S51812A Laceration without foreign body of left forearm, initial encounter: Secondary | ICD-10-CM | POA: Insufficient documentation

## 2017-10-20 DIAGNOSIS — Y999 Unspecified external cause status: Secondary | ICD-10-CM | POA: Diagnosis not present

## 2017-10-20 DIAGNOSIS — Z79899 Other long term (current) drug therapy: Secondary | ICD-10-CM | POA: Diagnosis not present

## 2017-10-20 DIAGNOSIS — Z87891 Personal history of nicotine dependence: Secondary | ICD-10-CM | POA: Diagnosis not present

## 2017-10-20 DIAGNOSIS — S41112A Laceration without foreign body of left upper arm, initial encounter: Secondary | ICD-10-CM

## 2017-10-20 LAB — BASIC METABOLIC PANEL
Anion gap: 12 (ref 5–15)
BUN: 11 mg/dL (ref 6–20)
CALCIUM: 9.3 mg/dL (ref 8.9–10.3)
CHLORIDE: 98 mmol/L — AB (ref 101–111)
CO2: 25 mmol/L (ref 22–32)
CREATININE: 0.5 mg/dL (ref 0.44–1.00)
GFR calc non Af Amer: >60 mL/min (ref >60)
Glucose, Bld: 105 mg/dL — ABNORMAL HIGH (ref 65–99)
Potassium: 3.7 mmol/L (ref 3.5–5.1)
SODIUM: 135 mmol/L (ref 135–145)

## 2017-10-20 LAB — CBC WITH DIFFERENTIAL/PLATELET
BASOS PCT: 1 %
Basophils Absolute: 0.1 10*3/uL (ref 0.0–0.1)
EOS ABS: 0.1 10*3/uL (ref 0.0–0.7)
Eosinophils Relative: 1 %
HCT: 36.9 % (ref 36.0–46.0)
HEMOGLOBIN: 12.7 g/dL (ref 12.0–15.0)
Lymphocytes Relative: 15 %
Lymphs Abs: 1.5 10*3/uL (ref 0.7–4.0)
MCH: 32.9 pg (ref 26.0–34.0)
MCHC: 34.4 g/dL (ref 30.0–36.0)
MCV: 95.6 fL (ref 78.0–100.0)
MONOS PCT: 9 %
Monocytes Absolute: 0.9 10*3/uL (ref 0.1–1.0)
NEUTROS PCT: 74 %
Neutro Abs: 7.5 10*3/uL (ref 1.7–7.7)
Platelets: 345 10*3/uL (ref 150–400)
RBC: 3.86 MIL/uL — ABNORMAL LOW (ref 3.87–5.11)
RDW: 13 % (ref 11.5–15.5)
WBC: 10.1 10*3/uL (ref 4.0–10.5)

## 2017-10-20 MED ORDER — LIDOCAINE-EPINEPHRINE (PF) 2 %-1:200000 IJ SOLN
10.0000 mL | Freq: Once | INTRAMUSCULAR | Status: AC
  Administered 2017-10-20: 10 mL via INTRADERMAL
  Filled 2017-10-20: qty 20

## 2017-10-20 MED ORDER — BACITRACIN-NEOMYCIN-POLYMYXIN 400-5-5000 EX OINT
TOPICAL_OINTMENT | Freq: Once | CUTANEOUS | Status: DC
  Filled 2017-10-20: qty 1

## 2017-10-20 MED ORDER — CLINDAMYCIN HCL 150 MG PO CAPS: 300.0000 mg | ORAL_CAPSULE | Freq: Three times a day (TID) | ORAL | 0 refills | Status: DC

## 2017-10-20 MED ORDER — CLINDAMYCIN PHOSPHATE 600 MG/50ML IV SOLN
600.0000 mg | Freq: Once | INTRAVENOUS | Status: AC
  Administered 2017-10-20: 600 mg via INTRAVENOUS
  Filled 2017-10-20: qty 50

## 2017-10-20 MED ORDER — TETANUS-DIPHTH-ACELL PERTUSSIS 5-2.5-18.5 LF-MCG/0.5 IM SUSP
0.5000 mL | Freq: Once | INTRAMUSCULAR | Status: AC
Start: 2017-10-20 — End: 2017-10-20
  Administered 2017-10-20: 0.5 mL via INTRAMUSCULAR
  Filled 2017-10-20: qty 0.5

## 2017-10-20 NOTE — ED Notes (Signed)
Pt presents with laceration to left forearm from hitting on "an antique" walking out of bathroom; denies blood thinner use.

## 2017-10-20 NOTE — ED Provider Notes (Signed)
[facility] DEPT Provider Note   CSN: [id number] Arrival date & time: 10/20/17  1355     History   Chief Complaint Chief Complaint  Patient presents with  . Laceration    HPI Gail Jennings is a 84 y.o. female.  HPI Gail Jennings is a 84 y.o. female presents to emergency department complaining of left arm injury.  Patient states that she hit her left arm on a intake chest at her house.  She sustained laceration to the forearm.  She states she covered her laceration with a gauze, and has not looked at it.  This occurred 2 days ago.  Today when she was in the shower, she noticed that it was "looking pretty bad."  She reports bruising around the laceration.  She is not on any blood thinners.  She is unsure when of her last tetanus was.  Denies any fever or chills.  Denies any drainage from the wound.  No other complaints.  Past Medical History:  Diagnosis Date  . Complication of anesthesia    cannot take anectine  . Inguinal hernia   . No pertinent past medical history     Patient Active Problem List   Diagnosis Date Noted  . History of left indirect inguinal hernia and lap repair left femoral hernia 11/22/2012    Past Surgical History:  Procedure Laterality Date  . ABDOMINAL HYSTERECTOMY    . DENTAL SURGERY  Jan 11, 2011   bone graft and extraction  . EYE SURGERY     detached retina, cryo, lens implant  . INGUINAL HERNIA REPAIR  09/27/2011   Procedure: HERNIA REPAIR INGUINAL INCARCERATED;  Surgeon: Pedro Earls, MD;  Location: [facility];  Service: General;  Laterality: Left;  Left Ingunial hernia repair with mesh  . INGUINAL HERNIA REPAIR Left 10/19/2012   Procedure: LAPAROSCOPIC possible open left femoral hernia repair ;  Surgeon: Pedro Earls, MD;  Location: [facility];  Service: General;  Laterality: Left;  Laparoscopic Left Femoral Hernia Repair with Mesh  . MOUTH SURGERY     lt upper bone graft for implants  .  TONSILLECTOMY    . TUBAL LIGATION      OB History    No data available       Home Medications    Prior to Admission medications   Medication Sig Start Date End Date Taking? Authorizing Provider  Calcium Carbonate-Vitamin D (CALCIUM 600 + D PO) Take 1 tablet by mouth every other day.    Yes [provider]  hydrochlorothiazide (HYDRODIURIL) 25 MG tablet Take 25 mg by mouth daily before breakfast.  08/11/11  Yes [provider]  Magnesium 250 MG TABS Take 1 tablet by mouth daily.   Yes [provider]  PREMARIN 0.45 MG tablet Take 0.45 mg by mouth daily.  08/11/11  Yes [provider]  acetaminophen (TYLENOL) 325 MG tablet Take 2 tablets (650 mg total) by mouth every 4 (four) hours as needed. Patient not taking: Reported on 10/20/2017 10/20/12   Excell Seltzer, MD  lidocaine (LIDODERM) 5 % Place 1 patch onto the skin daily. Remove & Discard patch within 12 hours or as directed by MD Patient not taking: Reported on 10/20/2017 05/13/17   Long, [facility], MD    Family History No family history on file.  Social History Social History   Tobacco Use  . Smoking status: Former Smoker    Last attempt to quit: 09/21/1970  Years since quitting: 47.1  Substance Use Topics  . Alcohol use: Yes    Alcohol/week: 1.2 oz    Types: 2 Glasses of wine per week    Comment: daily  . Drug use: No     Allergies   Codeine; Hydrocodone; and Succinylcholine chloride   Review of Systems Review of Systems  Constitutional: Negative for chills and fever.  Respiratory: Negative for cough, chest tightness and shortness of breath.   Cardiovascular: Negative for chest pain, palpitations and leg swelling.  Gastrointestinal: Negative for abdominal pain, diarrhea, nausea and vomiting.  Musculoskeletal: Positive for arthralgias and myalgias. Negative for neck pain and neck stiffness.  Skin: Positive for wound. Negative for rash.  Neurological: Negative for dizziness,  weakness, numbness and headaches.  All other systems reviewed and are negative.    Physical Exam Updated Vital Signs BP (!) 212/94 (BP Location: Right Arm)   Pulse 65   Temp 98.2 F (36.8 C) (Oral)   Resp (!) 24   SpO2 99%   Physical Exam  Constitutional: She is oriented to person, place, and time. She appears well-developed and well-nourished. No distress.  HENT:  Head: Normocephalic.  Eyes: Conjunctivae are normal.  Neck: Neck supple.  Cardiovascular: Normal rate, regular rhythm and normal heart sounds.  Pulmonary/Chest: Effort normal and breath sounds normal. No respiratory distress. She has no wheezes. She has no rales.  Abdominal: Soft. Bowel sounds are normal. She exhibits no distension. There is no tenderness. There is no rebound.  Musculoskeletal: She exhibits no edema.  Large flap laceration through subcutaneous fatty tissue to the left forearm, measuring approximately 15 cm.  Hemostatic and gaping.  Diffuse surrounding bruising, with maybe slight warmth to touch.  Smaller, superficial laceration/skin tear to the elbow.  Distal radial pulses intact.  Strength intact in left hand and wrist.  Neurological: She is alert and oriented to person, place, and time.  Skin: Skin is warm and dry.  Psychiatric: She has a normal mood and affect. Her behavior is normal.  Nursing note and vitals reviewed.    ED Treatments / Results  Labs (all labs ordered are listed, but only abnormal results are displayed) Labs Reviewed  CBC WITH DIFFERENTIAL/PLATELET - Abnormal; Notable for the following components:      Result Value   RBC 3.86 (*)    All other components within normal limits  BASIC METABOLIC PANEL - Abnormal; Notable for the following components:   Chloride 98 (*)    Glucose, Bld 105 (*)    All other components within normal limits    EKG  EKG Interpretation None       Radiology No results found.  Procedures .Marland KitchenLaceration Repair Date/Time: 10/20/2017 10:55  PM Performed by: Jeannett Senior, PA-C Authorized by: Jeannett Senior, PA-C   Consent:    Consent obtained:  Verbal   Consent given by:  Patient   Risks discussed:  Infection, pain, need for additional repair, poor cosmetic result and poor wound healing   Alternatives discussed:  No treatment, delayed treatment and observation Anesthesia (see MAR for exact dosages):    Anesthesia method:  Local infiltration   Local anesthetic:  Lidocaine 2% WITH epi Laceration details:    Location:  Shoulder/arm   Shoulder/arm location:  L lower arm   Length (cm):  15 Repair type:    Repair type:  Simple Pre-procedure details:    Preparation:  Patient was prepped and draped in usual sterile fashion Exploration:    Hemostasis achieved with:  Direct  pressure   Wound exploration: wound explored through full range of motion and entire depth of wound probed and visualized     Wound extent: no fascia violation noted, no tendon damage noted and no vascular damage noted   Treatment:    Area cleansed with:  Saline and Shur-Clens   Amount of cleaning:  Extensive   Irrigation solution:  Sterile saline   Irrigation method:  Syringe and pressure wash Skin repair:    Repair method:  Sutures   Suture size:  4-0   Suture material:  Prolene   Suture technique:  Simple interrupted   Number of sutures:  8 Approximation:    Approximation:  Close Post-procedure details:    Dressing:  Antibiotic ointment and sterile dressing   Patient tolerance of procedure:  Tolerated well, no immediate complications Comments:     Loose approximation of flap   (including critical care time)  Medications Ordered in ED Medications  clindamycin (CLEOCIN) IVPB 600 mg (600 mg Intravenous New Bag/Given 10/20/17 2007)  neomycin-bacitracin-polymyxin (NEOSPORIN) ointment (not administered)  lidocaine-EPINEPHrine (XYLOCAINE W/EPI) 2 %-1:200000 (PF) injection 10 mL (10 mLs Intradermal Given by Other 10/20/17 1907)  Tdap  (BOOSTRIX) injection 0.5 mL (0.5 mLs Intramuscular Given 10/20/17 1907)     Initial Impression / Assessment and Plan / ED Course  I have reviewed the triage vital signs and the nursing notes.  Pertinent labs & imaging results that were available during my care of the patient were reviewed by me and considered in my medical decision making (see chart for details).    Patient in emergency department with large flap laceration to the left forearm that is 29-day-old.  There is surrounding bruising versus early cellulitis with skin hyperpigmentation and warmth.  Discussed with Dr. Darl Householder, who is concerned about simple early infection.  Plus the flap part of the laceration is retracted leaving white gaping wound.  Plan to numb the wound, cleaned extensively, pulled the flap back and secured with stitches loosely.  Patient will receive clindamycin for treatment of possible infection.  8:17 PM Wound repaired with sutures loosely.  Discussed plan of following up with here or PCP in 2 days for wound check.  Otherwise keeping it clean, bacitracin topically, clindamycin p.o. for possible early cellulitis.  Suture removal in 7-10 days. Pt otherwise neurovascularly intact. tdap updated. BP initially 212/94  Recheck 175/69. Asymptomatc. Will need close outpatent follow up.    Vitals:   10/20/17 1436 10/20/17 2000 10/20/17 2020 10/20/17 2046  BP: (!) 176/120 (!) 212/94 (!) 189/88 (!) 175/69  Pulse: 74 65 66 74  Resp: 18 (!) 24 20 18   Temp: 98.2 F (36.8 C)  98.4 F (36.9 C) 98.3 F (36.8 C)  TempSrc: Oral  Oral Oral  SpO2: 96% 99% 100% 100%     Final Clinical Impressions(s) / ED Diagnoses   Final diagnoses:  Laceration of left upper extremity, initial encounter    ED Discharge Orders        Ordered    clindamycin (CLEOCIN) 150 MG capsule  3 times daily     10/20/17 2039       Jeannett Senior, PA-C 10/20/17 2258    Drenda Freeze, MD 10/23/17 [phone]

## 2017-10-20 NOTE — ED Notes (Signed)
Wound care performed cleasned with wound cleanser, bacitracin applied dry gauze and wrapped with coban. Pt educated on on wound care by demonstration. Pt verbalized understanding

## 2017-10-20 NOTE — Discharge Instructions (Signed)
Keep your wound clean.  You can get it wet, however re-wrap after.  Apply bacitracin with dressing changes.  Please follow-up in 2 days for [place] to recheck the wound to make sure it is not getting more infected.  Take clindamycin as prescribed until all gone. Return if any worsening in redness, swelling, pain, drainage.

## 2017-10-22 ENCOUNTER — Encounter (HOSPITAL_COMMUNITY): Payer: Self-pay

## 2017-10-22 ENCOUNTER — Other Ambulatory Visit: Payer: Self-pay

## 2017-10-22 ENCOUNTER — Emergency Department (HOSPITAL_COMMUNITY)
Admission: EM | Admit: 2017-10-22 | Discharge: 2017-10-22 | Disposition: A | Discharge status: Home / Self Care | Payer: Medicare Other | Attending: Emergency Medicine | Admitting: Emergency Medicine

## 2017-10-22 DIAGNOSIS — S41112D Laceration without foreign body of left upper arm, subsequent encounter: Secondary | ICD-10-CM | POA: Insufficient documentation

## 2017-10-22 DIAGNOSIS — Z5189 Encounter for other specified aftercare: Secondary | ICD-10-CM | POA: Diagnosis not present

## 2017-10-22 DIAGNOSIS — Z79899 Other long term (current) drug therapy: Secondary | ICD-10-CM | POA: Diagnosis not present

## 2017-10-22 DIAGNOSIS — Z87891 Personal history of nicotine dependence: Secondary | ICD-10-CM | POA: Insufficient documentation

## 2017-10-22 DIAGNOSIS — W268XXD Contact with other sharp object(s), not elsewhere classified, subsequent encounter: Secondary | ICD-10-CM | POA: Diagnosis not present

## 2017-10-22 NOTE — ED Provider Notes (Signed)
[facility] DEPT Provider Note   CSN: [id number] Arrival date & time: 10/22/17  1311     History   Chief Complaint Chief Complaint  Patient presents with  . Wound Check    HPI LISSETT FAVORITE is a 84 y.o. female.  Patient is an 84 year old female here to have a wound recheck.  She had sutures placed 2 days ago after cutting her arm on a bedside table.  Patient feels that things are improving.  However she has not removed the bandage.  She is also currently on clindamycin   The history is provided by the patient.  Wound Check  This is a new problem. The current episode started 2 days ago. The problem occurs constantly. The problem has been gradually improving. Associated symptoms comments: Laceration to the left forearm on a bedside table. Pt has kept area covered but feeling better. Nothing aggravates the symptoms. Nothing relieves the symptoms.    Past Medical History:  Diagnosis Date  . Complication of anesthesia    cannot take anectine  . Inguinal hernia   . No pertinent past medical history     Patient Active Problem List   Diagnosis Date Noted  . History of left indirect inguinal hernia and lap repair left femoral hernia 11/22/2012    Past Surgical History:  Procedure Laterality Date  . ABDOMINAL HYSTERECTOMY    . DENTAL SURGERY  Jan 11, 2011   bone graft and extraction  . EYE SURGERY     detached retina, cryo, lens implant  . INGUINAL HERNIA REPAIR  09/27/2011   Procedure: HERNIA REPAIR INGUINAL INCARCERATED;  Surgeon: Pedro Earls, MD;  Location: [facility];  Service: General;  Laterality: Left;  Left Ingunial hernia repair with mesh  . INGUINAL HERNIA REPAIR Left 10/19/2012   Procedure: LAPAROSCOPIC possible open left femoral hernia repair ;  Surgeon: Pedro Earls, MD;  Location: [facility];  Service: General;  Laterality: Left;  Laparoscopic Left Femoral Hernia Repair with Mesh  . MOUTH SURGERY     lt upper  bone graft for implants  . TONSILLECTOMY    . TUBAL LIGATION      OB History    No data available       Home Medications    Prior to Admission medications   Medication Sig Start Date End Date Taking? Authorizing Provider  acetaminophen (TYLENOL) 325 MG tablet Take 2 tablets (650 mg total) by mouth every 4 (four) hours as needed. Patient not taking: Reported on 10/20/2017 10/20/12   Excell Seltzer, MD  Calcium Carbonate-Vitamin D (CALCIUM 600 + D PO) Take 1 tablet by mouth every other day.     [provider]  clindamycin (CLEOCIN) 150 MG capsule Take 2 capsules (300 mg total) by mouth 3 (three) times daily. 10/20/17   Kirichenko, Lahoma Rocker, PA-C  hydrochlorothiazide (HYDRODIURIL) 25 MG tablet Take 25 mg by mouth daily before breakfast.  08/11/11   [provider]  lidocaine (LIDODERM) 5 % Place 1 patch onto the skin daily. Remove & Discard patch within 12 hours or as directed by MD Patient not taking: Reported on 10/20/2017 05/13/17   Long, Wonda Olds, MD  Magnesium 250 MG TABS Take 1 tablet by mouth daily.    [provider]  PREMARIN 0.45 MG tablet Take 0.45 mg by mouth daily.  08/11/11   [provider]    Family History No family history on file.  Social History Social History   Tobacco  Use  . Smoking status: Former Smoker    Last attempt to quit: 09/21/1970    Years since quitting: 47.1  Substance Use Topics  . Alcohol use: Yes    Alcohol/week: 1.2 oz    Types: 2 Glasses of wine per week    Comment: daily  . Drug use: No     Allergies   Codeine; Hydrocodone; and Succinylcholine chloride   Review of Systems Review of Systems  All other systems reviewed and are negative.    Physical Exam Updated Vital Signs BP (!) 209/95 (BP Location: Right Arm)   Pulse 73   Temp 98.4 F (36.9 C) (Oral)   Resp 18   Ht 5' 4.5" (1.638 m)   Wt 50.9 kg (112 lb 3 oz)   SpO2 100%   BMI 18.96 kg/m   Physical Exam  Constitutional: She is  oriented to person, place, and time. She appears well-developed and well-nourished. No distress.  HENT:  Head: Normocephalic and atraumatic.  Cardiovascular: Normal rate.  Pulmonary/Chest: Effort normal.  Musculoskeletal:  Large laceration to the dorsal left forearm with sutures intact.  No significant redness or drainage from the wound.  No evidence of wound infection.  Neurological: She is alert and oriented to person, place, and time.  Skin: Skin is warm and dry. Capillary refill takes less than 2 seconds.  Psychiatric: She has a normal mood and affect. Her behavior is normal.  Nursing note and vitals reviewed.    ED Treatments / Results  Labs (all labs ordered are listed, but only abnormal results are displayed) Labs Reviewed - No data to display  EKG  EKG Interpretation None       Radiology No results found.  Procedures Procedures (including critical care time)  Medications Ordered in ED Medications - No data to display   Initial Impression / Assessment and Plan / ED Course  I have reviewed the triage vital signs and the nursing notes.  Pertinent labs & imaging results that were available during my care of the patient were reviewed by me and considered in my medical decision making (see chart for details).     Patient here for a wound check.  Bandages removed and wound appears to be healing well.  No evidence of infection at this time.  Encourage patient to leave wound open intermittently use bacitracin as needed and cover the wound when she goes out of her home.  Also recommended suture removal in about 9 days.  Patient will also finish her clindamycin.  Final Clinical Impressions(s) / ED Diagnoses   Final diagnoses:  Visit for wound check    ED Discharge Orders    None       Blanchie Dessert, MD 10/22/17 1749

## 2017-10-22 NOTE — Discharge Instructions (Signed)
Cover area when you go out but can keep bandage off at home.  You can shower but do not soak area or scrub it.  Wash with a gentle soap.  Finish antibiotic.  Return sooner if you develop spreading redness, pus drainage or other concerns.

## 2017-10-22 NOTE — ED Triage Notes (Signed)
She fell two days ago, striking a piece of furniture, thereby lac. Her left forearm and elbow areas. She is  Here today for a recheck. She states she feels it is getting better. She denies fever,and is in no distress.

## 2017-10-24 DIAGNOSIS — S50312A Abrasion of left elbow, initial encounter: Secondary | ICD-10-CM | POA: Diagnosis not present

## 2017-10-24 DIAGNOSIS — S51812A Laceration without foreign body of left forearm, initial encounter: Secondary | ICD-10-CM | POA: Diagnosis not present

## 2017-10-26 DIAGNOSIS — S51812A Laceration without foreign body of left forearm, initial encounter: Secondary | ICD-10-CM | POA: Diagnosis not present

## 2017-10-30 DIAGNOSIS — S51812A Laceration without foreign body of left forearm, initial encounter: Secondary | ICD-10-CM | POA: Diagnosis not present

## 2017-11-30 DIAGNOSIS — I10 Essential (primary) hypertension: Secondary | ICD-10-CM | POA: Diagnosis not present

## 2017-11-30 DIAGNOSIS — S51812A Laceration without foreign body of left forearm, initial encounter: Secondary | ICD-10-CM | POA: Diagnosis not present

## 2017-12-14 DIAGNOSIS — I10 Essential (primary) hypertension: Secondary | ICD-10-CM | POA: Diagnosis not present

## 2017-12-14 DIAGNOSIS — B351 Tinea unguium: Secondary | ICD-10-CM | POA: Diagnosis not present

## 2017-12-14 DIAGNOSIS — Z681 Body mass index (BMI) 19 or less, adult: Secondary | ICD-10-CM | POA: Diagnosis not present

## 2017-12-14 DIAGNOSIS — D239 Other benign neoplasm of skin, unspecified: Secondary | ICD-10-CM | POA: Diagnosis not present

## 2017-12-14 DIAGNOSIS — Z1389 Encounter for screening for other disorder: Secondary | ICD-10-CM | POA: Diagnosis not present

## 2018-03-02 DIAGNOSIS — H2512 Age-related nuclear cataract, left eye: Secondary | ICD-10-CM | POA: Diagnosis not present

## 2018-03-02 DIAGNOSIS — Z961 Presence of intraocular lens: Secondary | ICD-10-CM | POA: Diagnosis not present

## 2018-04-12 DIAGNOSIS — Z85828 Personal history of other malignant neoplasm of skin: Secondary | ICD-10-CM | POA: Diagnosis not present

## 2018-04-12 DIAGNOSIS — L821 Other seborrheic keratosis: Secondary | ICD-10-CM | POA: Diagnosis not present

## 2018-04-12 DIAGNOSIS — D1801 Hemangioma of skin and subcutaneous tissue: Secondary | ICD-10-CM | POA: Diagnosis not present

## 2018-04-12 DIAGNOSIS — C44311 Basal cell carcinoma of skin of nose: Secondary | ICD-10-CM | POA: Diagnosis not present

## 2018-04-12 DIAGNOSIS — C44619 Basal cell carcinoma of skin of left upper limb, including shoulder: Secondary | ICD-10-CM | POA: Diagnosis not present

## 2018-04-12 DIAGNOSIS — L820 Inflamed seborrheic keratosis: Secondary | ICD-10-CM | POA: Diagnosis not present

## 2018-05-23 DIAGNOSIS — Z85828 Personal history of other malignant neoplasm of skin: Secondary | ICD-10-CM | POA: Diagnosis not present

## 2018-05-23 DIAGNOSIS — C44311 Basal cell carcinoma of skin of nose: Secondary | ICD-10-CM | POA: Diagnosis not present

## 2018-06-27 DIAGNOSIS — Z79899 Other long term (current) drug therapy: Secondary | ICD-10-CM | POA: Diagnosis not present

## 2018-06-27 DIAGNOSIS — R82998 Other abnormal findings in urine: Secondary | ICD-10-CM | POA: Diagnosis not present

## 2018-06-27 DIAGNOSIS — I10 Essential (primary) hypertension: Secondary | ICD-10-CM | POA: Diagnosis not present

## 2018-07-04 DIAGNOSIS — Z1389 Encounter for screening for other disorder: Secondary | ICD-10-CM | POA: Diagnosis not present

## 2018-07-04 DIAGNOSIS — I499 Cardiac arrhythmia, unspecified: Secondary | ICD-10-CM | POA: Diagnosis not present

## 2018-07-04 DIAGNOSIS — B351 Tinea unguium: Secondary | ICD-10-CM | POA: Diagnosis not present

## 2018-07-04 DIAGNOSIS — Z681 Body mass index (BMI) 19 or less, adult: Secondary | ICD-10-CM | POA: Diagnosis not present

## 2018-07-04 DIAGNOSIS — D239 Other benign neoplasm of skin, unspecified: Secondary | ICD-10-CM | POA: Diagnosis not present

## 2018-07-04 DIAGNOSIS — Z23 Encounter for immunization: Secondary | ICD-10-CM | POA: Diagnosis not present

## 2018-07-04 DIAGNOSIS — I10 Essential (primary) hypertension: Secondary | ICD-10-CM | POA: Diagnosis not present

## 2018-07-04 DIAGNOSIS — Z0000 Encounter for general adult medical examination without abnormal findings: Secondary | ICD-10-CM | POA: Diagnosis not present

## 2018-07-10 DIAGNOSIS — Z1212 Encounter for screening for malignant neoplasm of rectum: Secondary | ICD-10-CM | POA: Diagnosis not present

## 2018-07-18 ENCOUNTER — Other Ambulatory Visit: Payer: Self-pay | Admitting: Internal Medicine

## 2018-07-18 DIAGNOSIS — Z1231 Encounter for screening mammogram for malignant neoplasm of breast: Secondary | ICD-10-CM

## 2018-09-20 ENCOUNTER — Ambulatory Visit
Admission: RE | Admit: 2018-09-20 | Discharge: 2018-09-20 | Disposition: A | Discharge status: Home / Self Care | Payer: Medicare Other | Source: Ambulatory Visit | Attending: Internal Medicine | Admitting: Internal Medicine

## 2018-09-20 DIAGNOSIS — Z1231 Encounter for screening mammogram for malignant neoplasm of breast: Secondary | ICD-10-CM

## 2018-09-28 DIAGNOSIS — Z8669 Personal history of other diseases of the nervous system and sense organs: Secondary | ICD-10-CM | POA: Diagnosis not present

## 2018-09-28 DIAGNOSIS — Z961 Presence of intraocular lens: Secondary | ICD-10-CM | POA: Diagnosis not present

## 2018-09-28 DIAGNOSIS — Z9889 Other specified postprocedural states: Secondary | ICD-10-CM | POA: Diagnosis not present

## 2018-09-28 DIAGNOSIS — H2512 Age-related nuclear cataract, left eye: Secondary | ICD-10-CM | POA: Diagnosis not present

## 2018-10-11 DIAGNOSIS — H33312 Horseshoe tear of retina without detachment, left eye: Secondary | ICD-10-CM | POA: Diagnosis not present

## 2018-10-11 DIAGNOSIS — Z9889 Other specified postprocedural states: Secondary | ICD-10-CM | POA: Diagnosis not present

## 2018-10-11 DIAGNOSIS — H25812 Combined forms of age-related cataract, left eye: Secondary | ICD-10-CM | POA: Diagnosis not present

## 2018-10-11 DIAGNOSIS — Z961 Presence of intraocular lens: Secondary | ICD-10-CM | POA: Diagnosis not present

## 2018-10-11 DIAGNOSIS — Z8669 Personal history of other diseases of the nervous system and sense organs: Secondary | ICD-10-CM | POA: Diagnosis not present

## 2018-12-31 DIAGNOSIS — B351 Tinea unguium: Secondary | ICD-10-CM | POA: Diagnosis not present

## 2018-12-31 DIAGNOSIS — I10 Essential (primary) hypertension: Secondary | ICD-10-CM | POA: Diagnosis not present

## 2018-12-31 DIAGNOSIS — H269 Unspecified cataract: Secondary | ICD-10-CM | POA: Diagnosis not present

## 2018-12-31 DIAGNOSIS — D239 Other benign neoplasm of skin, unspecified: Secondary | ICD-10-CM | POA: Diagnosis not present

## 2018-12-31 DIAGNOSIS — I499 Cardiac arrhythmia, unspecified: Secondary | ICD-10-CM | POA: Diagnosis not present

## 2018-12-31 DIAGNOSIS — Z1331 Encounter for screening for depression: Secondary | ICD-10-CM | POA: Diagnosis not present

## 2019-05-06 DIAGNOSIS — Z85828 Personal history of other malignant neoplasm of skin: Secondary | ICD-10-CM | POA: Diagnosis not present

## 2019-05-06 DIAGNOSIS — C4441 Basal cell carcinoma of skin of scalp and neck: Secondary | ICD-10-CM | POA: Diagnosis not present

## 2019-06-04 DIAGNOSIS — L988 Other specified disorders of the skin and subcutaneous tissue: Secondary | ICD-10-CM | POA: Diagnosis not present

## 2019-06-04 DIAGNOSIS — C4441 Basal cell carcinoma of skin of scalp and neck: Secondary | ICD-10-CM | POA: Diagnosis not present

## 2019-06-04 DIAGNOSIS — Z85828 Personal history of other malignant neoplasm of skin: Secondary | ICD-10-CM | POA: Diagnosis not present

## 2019-07-03 DIAGNOSIS — I10 Essential (primary) hypertension: Secondary | ICD-10-CM | POA: Diagnosis not present

## 2019-07-09 DIAGNOSIS — I10 Essential (primary) hypertension: Secondary | ICD-10-CM | POA: Diagnosis not present

## 2019-07-09 DIAGNOSIS — R82998 Other abnormal findings in urine: Secondary | ICD-10-CM | POA: Diagnosis not present

## 2019-07-10 DIAGNOSIS — Z0000 Encounter for general adult medical examination without abnormal findings: Secondary | ICD-10-CM | POA: Diagnosis not present

## 2019-07-10 DIAGNOSIS — I10 Essential (primary) hypertension: Secondary | ICD-10-CM | POA: Diagnosis not present

## 2019-07-10 DIAGNOSIS — E871 Hypo-osmolality and hyponatremia: Secondary | ICD-10-CM | POA: Diagnosis not present

## 2019-07-19 DIAGNOSIS — Z23 Encounter for immunization: Secondary | ICD-10-CM | POA: Diagnosis not present

## 2019-08-16 ENCOUNTER — Other Ambulatory Visit: Payer: Self-pay

## 2019-08-16 ENCOUNTER — Ambulatory Visit (INDEPENDENT_AMBULATORY_CARE_PROVIDER_SITE_OTHER): Payer: Medicare Other

## 2019-08-16 ENCOUNTER — Ambulatory Visit (INDEPENDENT_AMBULATORY_CARE_PROVIDER_SITE_OTHER)
Admission: EM | Admit: 2019-08-16 | Discharge: 2019-08-16 | Disposition: A | Discharge status: Home / Self Care | Payer: Medicare Other | Source: Home / Self Care | Attending: Family Medicine | Admitting: Family Medicine

## 2019-08-16 ENCOUNTER — Emergency Department (HOSPITAL_COMMUNITY): Payer: Medicare Other

## 2019-08-16 ENCOUNTER — Emergency Department (HOSPITAL_COMMUNITY)
Admission: EM | Admit: 2019-08-16 | Discharge: 2019-08-16 | Disposition: A | Discharge status: Home / Self Care | Payer: Medicare Other | Attending: Emergency Medicine | Admitting: Emergency Medicine

## 2019-08-16 ENCOUNTER — Encounter (HOSPITAL_COMMUNITY): Payer: Self-pay | Admitting: *Deleted

## 2019-08-16 DIAGNOSIS — I4891 Unspecified atrial fibrillation: Secondary | ICD-10-CM | POA: Diagnosis not present

## 2019-08-16 DIAGNOSIS — Z79899 Other long term (current) drug therapy: Secondary | ICD-10-CM | POA: Diagnosis not present

## 2019-08-16 DIAGNOSIS — W19XXXA Unspecified fall, initial encounter: Secondary | ICD-10-CM | POA: Diagnosis not present

## 2019-08-16 DIAGNOSIS — I480 Paroxysmal atrial fibrillation: Secondary | ICD-10-CM | POA: Insufficient documentation

## 2019-08-16 DIAGNOSIS — Z5320 Procedure and treatment not carried out because of patient's decision for unspecified reasons: Secondary | ICD-10-CM | POA: Insufficient documentation

## 2019-08-16 DIAGNOSIS — S52502A Unspecified fracture of the lower end of left radius, initial encounter for closed fracture: Secondary | ICD-10-CM

## 2019-08-16 DIAGNOSIS — S41112A Laceration without foreign body of left upper arm, initial encounter: Secondary | ICD-10-CM

## 2019-08-16 DIAGNOSIS — Z23 Encounter for immunization: Secondary | ICD-10-CM

## 2019-08-16 DIAGNOSIS — Z87891 Personal history of nicotine dependence: Secondary | ICD-10-CM | POA: Diagnosis not present

## 2019-08-16 LAB — COMPREHENSIVE METABOLIC PANEL
ALT: 14 U/L (ref 0–44)
AST: 20 U/L (ref 15–41)
Albumin: 4.1 g/dL (ref 3.5–5.0)
Alkaline Phosphatase: 51 U/L (ref 38–126)
Anion gap: 10 (ref 5–15)
BUN: 10 mg/dL (ref 8–23)
CO2: 25 mmol/L (ref 22–32)
Calcium: 9.3 mg/dL (ref 8.9–10.3)
Chloride: 95 mmol/L — ABNORMAL LOW (ref 98–111)
Creatinine, Ser: 0.59 mg/dL (ref 0.44–1.00)
GFR calc Af Amer: >60 mL/min (ref >60)
GFR calc non Af Amer: >60 mL/min (ref >60)
Glucose, Bld: 111 mg/dL — ABNORMAL HIGH (ref 70–99)
Potassium: 4.3 mmol/L (ref 3.5–5.1)
Sodium: 130 mmol/L — ABNORMAL LOW (ref 135–145)
Total Bilirubin: 0.8 mg/dL (ref 0.3–1.2)
Total Protein: 6.2 g/dL — ABNORMAL LOW (ref 6.5–8.1)

## 2019-08-16 LAB — CBC WITH DIFFERENTIAL/PLATELET
Abs Immature Granulocytes: 0.07 10*3/uL (ref 0.00–0.07)
Abs Immature Granulocytes: 0.06 10*3/uL (ref 0.00–0.07)
Basophils Absolute: 0.1 10*3/uL (ref 0.0–0.1)
Basophils Absolute: 0.1 10*3/uL (ref 0.0–0.1)
Basophils Relative: 1 %
Basophils Relative: 0 %
Eosinophils Absolute: 0 10*3/uL (ref 0.0–0.5)
Eosinophils Absolute: 0 10*3/uL (ref 0.0–0.5)
Eosinophils Relative: 0 %
Eosinophils Relative: 0 %
HCT: 33.9 % — ABNORMAL LOW (ref 36.0–46.0)
HCT: 35.5 % — ABNORMAL LOW (ref 36.0–46.0)
Hemoglobin: 12 g/dL (ref 12.0–15.0)
Hemoglobin: 12.1 g/dL (ref 12.0–15.0)
Immature Granulocytes: 1 %
Immature Granulocytes: 1 %
Lymphocytes Relative: 6 %
Lymphocytes Relative: 5 %
Lymphs Abs: 0.7 10*3/uL (ref 0.7–4.0)
Lymphs Abs: 0.7 10*3/uL (ref 0.7–4.0)
MCH: 33.6 pg (ref 26.0–34.0)
MCH: 33.1 pg (ref 26.0–34.0)
MCHC: 35.4 g/dL (ref 30.0–36.0)
MCHC: 34.1 g/dL (ref 30.0–36.0)
MCV: 95 fL (ref 80.0–100.0)
MCV: 97 fL (ref 80.0–100.0)
Monocytes Absolute: 0.7 10*3/uL (ref 0.1–1.0)
Monocytes Absolute: 0.9 10*3/uL (ref 0.1–1.0)
Monocytes Relative: 6 %
Monocytes Relative: 7 %
Neutro Abs: 9.7 10*3/uL — ABNORMAL HIGH (ref 1.7–7.7)
Neutro Abs: 10.7 10*3/uL — ABNORMAL HIGH (ref 1.7–7.7)
Neutrophils Relative %: 86 %
Neutrophils Relative %: 87 %
Platelets: 314 10*3/uL (ref 150–400)
Platelets: 317 10*3/uL (ref 150–400)
RBC: 3.57 MIL/uL — ABNORMAL LOW (ref 3.87–5.11)
RBC: 3.66 MIL/uL — ABNORMAL LOW (ref 3.87–5.11)
RDW: 11.9 % (ref 11.5–15.5)
RDW: 12.1 % (ref 11.5–15.5)
WBC: 11.3 10*3/uL — ABNORMAL HIGH (ref 4.0–10.5)
WBC: 12.4 10*3/uL — ABNORMAL HIGH (ref 4.0–10.5)
nRBC: 0 % (ref 0.0–0.2)
nRBC: 0 % (ref 0.0–0.2)

## 2019-08-16 LAB — BASIC METABOLIC PANEL
Anion gap: 13 (ref 5–15)
BUN: 12 mg/dL (ref 8–23)
CO2: 22 mmol/L (ref 22–32)
Calcium: 9.4 mg/dL (ref 8.9–10.3)
Chloride: 93 mmol/L — ABNORMAL LOW (ref 98–111)
Creatinine, Ser: 0.52 mg/dL (ref 0.44–1.00)
GFR calc Af Amer: >60 mL/min (ref >60)
GFR calc non Af Amer: >60 mL/min (ref >60)
Glucose, Bld: 117 mg/dL — ABNORMAL HIGH (ref 70–99)
Potassium: 3.9 mmol/L (ref 3.5–5.1)
Sodium: 128 mmol/L — ABNORMAL LOW (ref 135–145)

## 2019-08-16 LAB — BRAIN NATRIURETIC PEPTIDE: B Natriuretic Peptide: 137.3 pg/mL — ABNORMAL HIGH (ref 0.0–100.0)

## 2019-08-16 LAB — TSH: TSH: 1.964 u[IU]/mL (ref 0.350–4.500)

## 2019-08-16 LAB — TROPONIN I (HIGH SENSITIVITY): Troponin I (High Sensitivity): 3 ng/L (ref <18)

## 2019-08-16 MED ORDER — LIDOCAINE-EPINEPHRINE 2 %-1:200000 IJ SOLN
INTRAMUSCULAR | Status: AC
Start: 2019-08-16 — End: ?
  Filled 2019-08-16: qty 20

## 2019-08-16 MED ORDER — TETANUS-DIPHTH-ACELL PERTUSSIS 5-2.5-18.5 LF-MCG/0.5 IM SUSP
INTRAMUSCULAR | Status: AC
  Filled 2019-08-16: qty 0.5

## 2019-08-16 MED ORDER — TETANUS-DIPHTH-ACELL PERTUSSIS 5-2.5-18.5 LF-MCG/0.5 IM SUSP
0.5000 mL | Freq: Once | INTRAMUSCULAR | Status: AC
  Administered 2019-08-16: 10:00:00 0.5 mL via INTRAMUSCULAR

## 2019-08-16 NOTE — ED Provider Notes (Addendum)
[facility]    CSN: [id number] Arrival date & time: 08/16/19  0920      History   Chief Complaint Chief Complaint  Patient presents with  . Fall  . Extremity Laceration    HPI Gail Jennings is a 85 y.o. female.   Gail Jennings presents with complaints of left upper arm laceration, left wrist pain and swelling after fall last night. She was walking into her bedroom as she was getting ready for bed, and she suddenly fell. She thinks her arm struck her bedside table but she is uncertain. She does not think she lost consciousness, she has memory of the event generally. Didn't hit her head. She is not on any blood thinners. Denies any dizziness, chest pain , headache, prior to fall or currently. She feels well. She had gone to the gym yesterday, she does so daily. No medical history. No shortness of breath. Realized this morning that her laceration is larger than she expected so sought care. Left wrist is swollen and painful as well. No numbness or tingling to left hand. No left elbow or shoulder pain. Without contributing medical history.       ROS per HPI, negative if not otherwise mentioned.      Past Medical History:  Diagnosis Date  . Complication of anesthesia    cannot take anectine  . Inguinal hernia   . No pertinent past medical history     Patient Active Problem List   Diagnosis Date Noted  . History of left indirect inguinal hernia and lap repair left femoral hernia 11/22/2012    Past Surgical History:  Procedure Laterality Date  . ABDOMINAL HYSTERECTOMY    . DENTAL SURGERY  Jan 11, 2011   bone graft and extraction  . EYE SURGERY     detached retina, cryo, lens implant  . INGUINAL HERNIA REPAIR  09/27/2011   Procedure: HERNIA REPAIR INGUINAL INCARCERATED;  Surgeon: Pedro Earls, MD;  Location: [facility];  Service: General;  Laterality: Left;  Left Ingunial hernia repair with mesh  . INGUINAL HERNIA REPAIR Left  10/19/2012   Procedure: LAPAROSCOPIC possible open left femoral hernia repair ;  Surgeon: Pedro Earls, MD;  Location: [facility];  Service: General;  Laterality: Left;  Laparoscopic Left Femoral Hernia Repair with Mesh  . MOUTH SURGERY     lt upper bone graft for implants  . TONSILLECTOMY    . TUBAL LIGATION      OB History   No obstetric history on file.      Home Medications    Prior to Admission medications   Medication Sig Start Date End Date Taking? Authorizing Provider  acetaminophen (TYLENOL) 325 MG tablet Take 2 tablets (650 mg total) by mouth every 4 (four) hours as needed. Patient not taking: Reported on 10/20/2017 10/20/12   Excell Seltzer, MD  Calcium Carbonate-Vitamin D (CALCIUM 600 + D PO) Take 1 tablet by mouth every other day.     [provider]  clindamycin (CLEOCIN) 150 MG capsule Take 2 capsules (300 mg total) by mouth 3 (three) times daily. 10/20/17   Kirichenko, Lahoma Rocker, PA-C  hydrochlorothiazide (HYDRODIURIL) 25 MG tablet Take 25 mg by mouth daily before breakfast.  08/11/11   [provider]  lidocaine (LIDODERM) 5 % Place 1 patch onto the skin daily. Remove & Discard patch within 12 hours or as directed by MD Patient not taking: Reported on 10/20/2017 05/13/17   Long, Wonda Olds, MD  Magnesium 250 MG TABS Take 1 tablet by mouth daily.    [provider]  PREMARIN 0.45 MG tablet Take 0.45 mg by mouth daily.  08/11/11   [provider]    Family History No family history on file.  Social History Social History   Tobacco Use  . Smoking status: Former Smoker    Quit date: 09/21/1970    Years since quitting: 48.9  Substance Use Topics  . Alcohol use: Yes    Alcohol/week: 2.0 standard drinks    Types: 2 Glasses of wine per week    Comment: daily  . Drug use: No     Allergies   Codeine, Hydrocodone, and Succinylcholine chloride   Review of Systems Review of Systems   Physical Exam Triage Vital Signs ED Triage  Vitals  Enc Vitals Group     BP 08/16/19 0931 (!) 184/90     Pulse Rate 08/16/19 0931 93     Resp 08/16/19 0931 16     Temp 08/16/19 0931 97.6 F (36.4 C)     Temp Source 08/16/19 0931 Oral     SpO2 08/16/19 0931 93 %     Weight --      Height --      Head Circumference --      Peak Flow --      Pain Score 08/16/19 0932 6     Pain Loc --      Pain Edu? --      Excl. in [place]? --    No data found.  Updated Vital Signs BP (!) 184/90 (BP Location: Right Arm)   Pulse 93   Temp 97.6 F (36.4 C) (Oral)   Resp 16   SpO2 93%    Physical Exam Constitutional:      General: She is not in acute distress.    Appearance: She is well-developed.  HENT:     Head: Normocephalic and atraumatic.  Cardiovascular:     Rate and Rhythm: Normal rate.     Comments: Denies chest pain or shortness of breath. No increased work of breathing  Pulmonary:     Effort: Pulmonary effort is normal.     Breath sounds: Normal breath sounds.  Musculoskeletal:     Left upper arm: Laceration present.     Left elbow: Normal.     Left wrist: Swelling, tenderness and bony tenderness present. No lacerations. Normal range of motion.       Arms:     Comments: Left posterior distal upper arm with skin flap laceration, curved, approximately 4 cm in total, depth of superficial flap superiorly approximately 4 cm as well; just distal to elbow with skin flap as well, approximately 1.5 cm of curved flap; no active bleeding; no bony tenderness to left elbow and full ROM of elbow; swelling and generalized tenderness to left wrist with full ROM noted; strong pulse  Skin:    General: Skin is warm and dry.  Neurological:     General: No focal deficit present.     Mental Status: She is alert and oriented to person, place, and time. Mental status is at baseline.        EKG:  Atrial fibrillation rate of 78 . Previous EKG was available for review. No stwave changes as interpreted by me. This is a new rhythm, previous ekg  from 2014 is NSR.   UC Treatments / Results  Labs (all labs ordered are listed, but only abnormal results are displayed) Labs Reviewed  CBC WITH DIFFERENTIAL/PLATELET - Abnormal; Notable for the following components:      Result Value   WBC 11.3 (*)    RBC 3.57 (*)    HCT 33.9 (*)    Neutro Abs 9.7 (*)    All other components within normal limits  COMPREHENSIVE METABOLIC PANEL    EKG   Radiology DG Wrist Complete Left  Result Date: 08/16/2019 CLINICAL DATA:  Left wrist swelling/pain, fall EXAM: LEFT WRIST - COMPLETE 3+ VIEW COMPARISON:  None. FINDINGS: Transverse distal radial shaft fracture. No definite intra-articular extension. Ulnar styloid is intact. Mild soft tissue swelling. IMPRESSION: Transverse distal radial shaft fracture. Electronically Signed   By: Julian Hy M.D.   On: 08/16/2019 10:22    Procedures Laceration Repair  Date/Time: 08/16/2019 11:17 AM Performed by: Zigmund Gottron, NP Authorized by: Raylene Everts, MD   Consent:    Consent obtained:  Verbal   Consent given by:  Patient   Risks discussed:  Infection, need for additional repair, poor cosmetic result, pain and poor wound healing   Alternatives discussed:  No treatment, observation and referral Anesthesia (see MAR for exact dosages):    Anesthesia method:  Local infiltration   Local anesthetic:  Lidocaine 2% WITH epi Laceration details:    Location:  Shoulder/arm   Shoulder/arm location:  L upper arm   Length (cm):  4 (1.5 to distal lac )   Depth (mm):  4 (10 in superior/ internal depth ) Repair type:    Repair type:  Simple Pre-procedure details:    Preparation:  Patient was prepped and draped in usual sterile fashion Exploration:    Hemostasis achieved with:  Direct pressure and epinephrine   Wound exploration: wound explored through full range of motion     Contaminated: no   Treatment:    Area cleansed with:  Betadine and Shur-Clens Skin repair:    Repair method:   Sutures   Suture size:  4-0   Suture material:  Nylon   Suture technique:  Simple interrupted   Number of sutures:  9 (9 to proximal lac; 3 to distal lac) Approximation:    Approximation:  Close Post-procedure details:    Dressing:  Non-adherent dressing   Patient tolerance of procedure:  Tolerated well, no immediate complications   (including critical care time)  Medications Ordered in UC Medications  Tdap (BOOSTRIX) injection 0.5 mL (0.5 mLs Intramuscular Given 08/16/19 0958)    Initial Impression / Assessment and Plan / UC Course  I have reviewed the triage vital signs and the nursing notes.  Pertinent labs & imaging results that were available during my care of the patient were reviewed by me and considered in my medical decision making (see chart for details).     10:06: page out to patient's PCP office to consult if she will be able to receive prompt follow up for new onset afib.   1025: return page from [employer] [facility] [employer], patient's pcp office, requesting that patient is evaluated in the ER.     Fall last night, unable to state why, denies trip or mechanical nature specifically. No chest pain , shortness of breath , headache or dizziness now. BP stable. Heart rate is stable. On ekg new onset afib, however. Patient requesting to go to her PCP office, they state they would send her to the ER and unable to see her today anyways, recommend that she is evaluated in the ER. Two lacs to left forarm, simple interupted  sutures placed with close/fair approximation; more distal wound with some tear component which was left open. Left radius fracture with wrist brace applied, encouraged to follow up with orthopedics. Patient verbalized understanding and agreeable to plan.  She is ambulatory out of clinic to her friend's car who is going to transport her to the ED.    Noted O2 at 93% on arrival. Slight elevation in WBC and neuts noted. Denies cough or URI symptoms. Declines  covid testing here in [facility]. Transferring to ED.  Final Clinical Impressions(s) / UC Diagnoses   Final diagnoses:  New onset atrial fibrillation ([place])  Fall, initial encounter  Closed fracture of distal end of left radius, unspecified fracture morphology, initial encounter  Laceration of left upper extremity, initial encounter     Discharge Instructions     Please go to the ER now for further evaluation and management of your atrial fibrillation, especially since you fell.  Please wear wrist splint as provided at all times until seen and evaluated by orthopedics.  May call Dr. Carlean Jews office today to let them know you were seen here and found to have a distal radius fracture, to set up follow up appointment.  Keep dressing in place for the next 24 hours.  Then may remove, cleanse wounds daily with soap and water, pat dry, and keep covered.  Return here or with your Pcp in 10 days for removal of sutures.  Ice, elevate, tylenol or motrin as needed for pain.  Please return sooner if significant redness, drainage or pus from the wounds if concerned about infection.      ED Prescriptions    None     PDMP not reviewed this encounter.   Zigmund Gottron, NP 08/16/19 1121    Zigmund Gottron, NP 08/16/19 1123

## 2019-08-16 NOTE — ED Triage Notes (Addendum)
Pt states she fell last night and broke her wrist. She come back today, unclear as to why other "it's Friday I guess" She states she had stitches placed in her arm ? Elbow area. Pt keeps stating she is very active. After much investigation she states "[facility] sent her [facility] cause she has atrial fib"Denies chest pain or any other symptoms. She brought her copies of EKG and does not want another one. Spoke with dr Wyvonnia Dusky who is aware.

## 2019-08-16 NOTE — ED Notes (Signed)
Pts wound to left arm wrapped with Telfa, gauze and Kerlix

## 2019-08-16 NOTE — ED Notes (Addendum)
Attempted to obtain ordered EKG for pt. Upon entering room, pt states that she just had EKG at [facility] and she does not need another one. Dr Wyvonnia Dusky at bedside

## 2019-08-16 NOTE — Discharge Instructions (Signed)
You declined admission to the hospital today.  It was recommended that you stay for evaluation of heart rhythm problems.  Is not clear whether you passed out or fell.  The cardiologist recommended that you be on anticoagulation to prevent strokes but you do not want this either. Your primary doctor should remove your stitches in 1 week. Call your doctor on Monday and follow-up with a cardiologist.  Return to the ED if you change your mind about treatment.

## 2019-08-16 NOTE — ED Triage Notes (Signed)
Pt reports she tripped in her bedroom last night and inj her left elbow.... has a lac to her left elbow and also reports swelling to left wrist  Denies head inj/LOC, blood thinners  A&O x4... NAD.Marland Kitchen. ambulatory

## 2019-08-16 NOTE — ED Notes (Signed)
X-ray at bedside

## 2019-08-16 NOTE — ED Notes (Signed)
UPON SPEAKING WITH PT TO UPDATE ABOUT WAIT, SHE IS UNSURE IF SHE HAS EVER HAD A FIB OR NOT. SHE IS CONSTANTLY STATING DIFFERENT THINGS REGARDING THIS EVENT. SPOKE WITH CN WHO IS IN AGREEMENT TO  BRING HER BACK TO BE MONITORED.

## 2019-08-16 NOTE — ED Notes (Signed)
Call Jane for ride home. Patient has the phone number.

## 2019-08-16 NOTE — Consult Note (Addendum)
Cardiology Consultation:   Patient ID: Gail Jennings; [id number]; 12/28/1932   Admit date: 08/16/2019 Date of Consult: 08/16/2019  Primary Care Provider: Burnard Bunting, MD Primary Cardiologist: New to [place]; Dr. Angelena Form Primary Electrophysiologist:  None   Patient Profile:   Gail Jennings is a 85 y.o. female with a PMH of HTN who is being seen today for the evaluation of new onset atrial fibrillation at the request of Dr. Wyvonnia Dusky.  History of Present Illness:   Gail Jennings was in her usual state of health until last night when she had a fall when she attempted to get out of bed. She sustained a laceration to her elbow after hitting it on her nightstand, as well as a left wrist fracture. She does not recall the exact details of the fall but denies any dizziness, lightheadedness, loss of consciousness, chest pain, palpitations, or racing heart beats in the events surrounding the fall. She initially presented to an urgent care facility where her laceration was repaired. She had an EKG at that time which revealed atrial fibrillation which is a new diagnosis for her. She was sent to [facility] ED for further evaluation.   She denies prior cardiac history. She has never undergone any cardiac testing in the past (echo/stress test/heart cath). She takes olmesartan-HCTZ for BP management. She was seen by her PCP ~3 months ago, at which time she had an EKG, though she does not recall ever being told she has atrial fibrillation. She goes to the gym almost daily and denies any anginal complaints with exercise. She drinks 1-2 cocktails daily. She quit smoking ~40 years ago. She denies family history of CAD/CHF.   At the time of this evaluation she is eager to be discharge home. She has no complaints of chest pain, SOB, or palpitations. We discussed the nature of atrial fibrillation and risk of stroke. She is hesitant to start an anticoagulant without talking to her PCP. She denies  orthopnea, PND, LE edema. She denies fever, URI symptoms, dysuria, hematuria, N/V/D, hematochezia, or melena.   Past Medical History:  Diagnosis Date  . Complication of anesthesia    cannot take anectine  . Inguinal hernia   . No pertinent past medical history     Past Surgical History:  Procedure Laterality Date  . ABDOMINAL HYSTERECTOMY    . DENTAL SURGERY  Jan 11, 2011   bone graft and extraction  . EYE SURGERY     detached retina, cryo, lens implant  . INGUINAL HERNIA REPAIR  09/27/2011   Procedure: HERNIA REPAIR INGUINAL INCARCERATED;  Surgeon: Pedro Earls, MD;  Location: [facility];  Service: General;  Laterality: Left;  Left Ingunial hernia repair with mesh  . INGUINAL HERNIA REPAIR Left 10/19/2012   Procedure: LAPAROSCOPIC possible open left femoral hernia repair ;  Surgeon: Pedro Earls, MD;  Location: [facility];  Service: General;  Laterality: Left;  Laparoscopic Left Femoral Hernia Repair with Mesh  . MOUTH SURGERY     lt upper bone graft for implants  . TONSILLECTOMY    . TUBAL LIGATION       Home Medications:  Prior to Admission medications   Medication Sig Start Date End Date Taking? Authorizing Provider  acetaminophen (TYLENOL) 325 MG tablet Take 2 tablets (650 mg total) by mouth every 4 (four) hours as needed. Patient not taking: Reported on 10/20/2017 10/20/12   Excell Seltzer, MD  Calcium Carbonate-Vitamin D (CALCIUM 600 + D PO) Take 1 tablet by  mouth every other day.     [provider]  clindamycin (CLEOCIN) 150 MG capsule Take 2 capsules (300 mg total) by mouth 3 (three) times daily. 10/20/17   Kirichenko, Lahoma Rocker, PA-C  hydrochlorothiazide (HYDRODIURIL) 25 MG tablet Take 25 mg by mouth daily before breakfast.  08/11/11   [provider]  lidocaine (LIDODERM) 5 % Place 1 patch onto the skin daily. Remove & Discard patch within 12 hours or as directed by MD Patient not taking: Reported on 10/20/2017 05/13/17   Long, Wonda Olds, MD   Magnesium 250 MG TABS Take 1 tablet by mouth daily.    [provider]  PREMARIN 0.45 MG tablet Take 0.45 mg by mouth daily.  08/11/11   [provider]    Inpatient Medications: Scheduled Meds:  Continuous Infusions:  PRN Meds:   Allergies:    Allergies  Allergen Reactions  . Codeine Nausea And Vomiting  . Hydrocodone Nausea And Vomiting  . Succinylcholine Chloride     Prolonged paralysis    Social History:   Social History   Socioeconomic History  . Marital status: Divorced    Spouse name: Not on file  . Number of children: Not on file  . Years of education: Not on file  . Highest education level: Not on file  Occupational History  . Not on file  Tobacco Use  . Smoking status: Former Smoker    Quit date: 09/21/1970    Years since quitting: 48.9  . Smokeless tobacco: Never Used  Substance and Sexual Activity  . Alcohol use: Yes    Alcohol/week: 2.0 standard drinks    Types: 2 Glasses of wine per week    Comment: daily  . Drug use: No  . Sexual activity: Not on file  Other Topics Concern  . Not on file  Social History Narrative  . Not on file   Social Determinants of Health   Financial Resource Strain:   . Difficulty of Paying Living Expenses: Not on file  Food Insecurity:   . Worried About Charity fundraiser in the Last Year: Not on file  . Ran Out of Food in the Last Year: Not on file  Transportation Needs:   . Lack of Transportation (Medical): Not on file  . Lack of Transportation (Non-Medical): Not on file  Physical Activity:   . Days of Exercise per Week: Not on file  . Minutes of Exercise per Session: Not on file  Stress:   . Feeling of Stress : Not on file  Social Connections:   . Frequency of Communication with Friends and Family: Not on file  . Frequency of Social Gatherings with Friends and Family: Not on file  . Attends Religious Services: Not on file  . Active Member of Clubs or Organizations: Not on file  . Attends  Archivist Meetings: Not on file  . Marital Status: Not on file  Intimate Partner Violence:   . Fear of Current or Ex-Partner: Not on file  . Emotionally Abused: Not on file  . Physically Abused: Not on file  . Sexually Abused: Not on file    Family History:   Family History  Problem Relation Age of Onset  . Parkinson's disease Mother   . Healthy Sister      ROS:  Please see the history of present illness.  ROS  All other ROS reviewed and negative.     Physical Exam/Data:   Vitals:   08/16/19 1143 08/16/19 1145  BP: (!) 140/99   Pulse: 71   Resp: 15   Temp: 97.9 F (36.6 C)   TempSrc: Oral   SpO2: 100%   Weight:  49 kg  Height:  5\' 4"  (1.626 m)   No intake or output data in the 24 hours ending 08/16/19 1447 Filed Weights   08/16/19 1145  Weight: 49 kg   Body mass index is 18.54 kg/m.  General:  Thin elderly female who appears younger than stated age 67: sclera anicteric  Neck: no JVD Vascular: No carotid bruits; distal pulses 2+ bilaterally Cardiac:  normal S1, S2; RRR; no murmurs, rubs, or gallops Lungs:  clear to auscultation bilaterally, no wheezing, rhonchi or rales  Abd: NABS, soft, nontender, no hepatomegaly Ext: no LE edema; left elbow with posterior laceration s/p suture repair with ecchymosis; left wrist in a brace Musculoskeletal:  No deformities, BUE and BLE strength normal and equal Skin: warm and dry  Neuro:  CNs 2-12 intact, no focal abnormalities noted Psych:  Normal affect   EKG:  The EKG was personally reviewed and demonstrates:  Atrial fibrillation with rate 78 bpm, no STE/D, no TWI; non-specific T wave abnormalities  Relevant CV Studies: None  Laboratory Data:  Chemistry Recent Labs  Lab 08/16/19 0945 08/16/19 1247  NA 130* 128*  K 4.3 3.9  CL 95* 93*  CO2 25 22  GLUCOSE 111* 117*  BUN 10 12  CREATININE 0.59 0.52  CALCIUM 9.3 9.4  GFRNONAA >60 >60  GFRAA >60 >60  ANIONGAP 10 13    Recent Labs  Lab  08/16/19 0945  PROT 6.2*  ALBUMIN 4.1  AST 20  ALT 14  ALKPHOS 51  BILITOT 0.8   Hematology Recent Labs  Lab 08/16/19 0945 08/16/19 1247  WBC 11.3* 12.4*  RBC 3.57* 3.66*  HGB 12.0 12.1  HCT 33.9* 35.5*  MCV 95.0 97.0  MCH 33.6 33.1  MCHC 35.4 34.1  RDW 11.9 12.1  PLT 314 317   Cardiac EnzymesNo results for input(s): TROPONINI in the last 168 hours. No results for input(s): TROPIPOC in the last 168 hours.  BNP Recent Labs  Lab 08/16/19 1247  BNP 137.3*    DDimer No results for input(s): DDIMER in the last 168 hours.  Radiology/Studies:  DG Elbow Complete Left  Result Date: 08/16/2019 CLINICAL DATA:  Fall.  Laceration. EXAM: LEFT ELBOW - COMPLETE 3+ VIEW COMPARISON:  None. FINDINGS: There is no evidence of fracture, dislocation, or joint effusion. There is no evidence of arthropathy or other focal bone abnormality. Soft tissues are unremarkable. IMPRESSION: Negative. Electronically Signed   By: Dorise Bullion III M.D   On: 08/16/2019 13:12   DG Wrist Complete Left  Result Date: 08/16/2019 CLINICAL DATA:  Left wrist swelling/pain, fall EXAM: LEFT WRIST - COMPLETE 3+ VIEW COMPARISON:  None. FINDINGS: Transverse distal radial shaft fracture. No definite intra-articular extension. Ulnar styloid is intact. Mild soft tissue swelling. IMPRESSION: Transverse distal radial shaft fracture. Electronically Signed   By: Julian Hy M.D.   On: 08/16/2019 10:22   DG Chest Portable 1 View  Result Date: 08/16/2019 CLINICAL DATA:  [place] last night and broke her wrist, laceration EXAM: PORTABLE CHEST 1 VIEW COMPARISON:  Portable exam 1242 hours compared to 05/13/2017 FINDINGS: Normal heart size, mediastinal contours, and pulmonary vascularity. Atherosclerotic calcification aorta. Lungs hyperinflated but clear. No pulmonary infiltrate, pleural effusion or pneumothorax. Bones demineralized. IMPRESSION: No acute abnormalities. Aortic Atherosclerosis (ICD10-I70.0). Electronically Signed    By: Crist Infante.D.  On: 08/16/2019 13:11    Assessment and Plan:   1. New onset atrial fibrillation: incidental finding on EKG at urgent care today after presenting post-fall with laceration to left elbow and L wrist fracture. No complaints of palpitations, chest pain, dizziness, lightheadedness, or LOC, though with fuzzy details regarding the fall, cannot exclude syncope. EKG with atrial fibrillation with rate 78 bpm, non-ischemic. HsTrop negative. BNP mildly elevated to 178. CXR without acute findings; notes aortic atherosclerosis. No prior cardiac work up. No anginal complaints with exercise. TSH was wnl. WBC mildly elevated but no clear infectious symptoms or fevers. No recent changes in medications. Etiology is unclear.  - This patients CHA2DS2-VASc Score and unadjusted Ischemic Stroke Rate (% per year) is equal to at least 4.8 % stroke rate/year from a score of 4 Above score calculated as 1 point each if present [CHF, HTN, DM, Vascular=MI/PAD/Aortic Plaque, Age if 65-74, or Female] Above score calculated as 2 points each if present [Age > 75, or Stroke/TIA/TE] - She would benefit from anticoagulation for stoke ppx - recommend apixaban 2.5mg  BID (dose adjusted for age >26 and weight <60kg). Patient wanted to discuss this recommendation with her PCP prior to starting anticoagulation.  - No indication for AV nodal blocking agents as HR is well controlled.  - She would benefit from further cardiac work-up, including an echocardiogram and an ischemic evaluation (favor stress test vs coronary CTA).   2. Fall: patient experienced a fall when getting out of bed overnight. Details are a little fuzzy, though she does remember going down. Cannot exclude a syncopal event. She unfortunately suffered a left elbow laceration s/p suture repair at urgent care, as well as a left wrist fracture, for which a brace was placed.  - Recommend ongoing cardiac work-up as above.   3. Elevated BNP: mildly elevated.  She appears euvolemic on exam. No prior echo.  - Recommend an echo to further evaluate LV and valvular function.  4. HTN: BP elevated though she attributes this to being a little worked up.  - Continue home olmesartan-HCTZ.  5. Hyponatremia: Na 128. Possible this contributed to her fall.  - Will defer management to primary team.    For questions or updates, please contact [place] Please consult [email or web address] for contact info under Cardiology/STEMI.   Signed, Abigail Butts, PA-C  08/16/2019 2:47 PM [phone]  I have personally seen and examined this patient. I agree with the assessment and plan as outlined above.  Gail Jennings has newly documented atrial fib. Heart rate 70s. No symptoms. Unclear if she had syncope at home. She states that she fell but does not think she lost consciousness. She is adamant that she will not be admitted or start anti-coagulation. We have discussed the risk of stroke with atrial fibrillation.  EKG shows atrial fib with rate 70s. Reviewed by me. Labs reviewed by me.  My exam: General: Thin female, NAD  HEENT: OP clear, mucus membranes moist  SKIN: warm, dry. No rashes. Neuro: No focal deficits  Musculoskeletal: Muscle strength 5/5 all ext  Psychiatric: Mood and affect normal  Neck: No JVD, no carotid bruits, no thyromegaly, no lymphadenopathy.  Lungs:Clear bilaterally, no wheezes, rhonci, crackles Cardiovascular: Irreg irreg. No murmurs, gallops or rubs. Abdomen:Soft. Bowel sounds present. Non-tender.  Extremities: No lower extremity edema. Pulses are 2 + in the bilateral DP/PT.  Plan: Atrial fib: rate 70s. Unknown duration. We discussed the risk of stroke. I have advised that she start anti-coagulation but she refuses. She wishes  to discuss with her primary care, Dr. Reynaldo Minium. Ideally, would monitor overnight and obtain an echo but she refuses to stay overnight. We have offered follow up in our office but she wishes to review it all with  Dr. Reynaldo Minium.   Gail Jennings 08/16/2019 3:30 PM

## 2019-08-16 NOTE — ED Provider Notes (Signed)
[facility] DEPT Provider Note   CSN: [id number] Arrival date & time: 08/16/19  1135     History Chief Complaint  Patient presents with  . Fall    Gail Jennings is a 85 y.o. female.  Patient sent from urgent care after a fall last night.  Patient states she fell in her bedroom while she was getting ready for bed last night.  She believes her arm struck a bedside table.  Sustained a laceration to her left posterior elbow which was repaired in urgent care.  She also injured her left wrist.  Denies hitting her head or losing consciousness.  Does not remember what made her fall.  Admits to 2 alcoholic drinks last night but that is her norm.  Denies any preceding dizziness, lightheadedness, chest pain or shortness of breath.  Does not believe that she tripped or stumbled.  Urgent care performed EKG which showed atrial fibrillation which the patient does not believe she is had before.  She does not take any blood thinners.  No numbness or tingling.  She feels well and went to the gym yesterday as she normally does.  She sees Dr. Reynaldo Minium but she does not recall if he ever told her she had atrial fibrillation. Denies hitting her head during the fall yesterday.  There is no neck or back pain.  There is no chest pain or back pain or abdominal pain.  She was found to have a distal radius fracture and a laceration to her left posterior arm in urgent care. She is already telling me that she does not want to be on blood thinners.  The history is provided by the patient.  Fall Pertinent negatives include no chest pain, no abdominal pain, no headaches and no shortness of breath.       Past Medical History:  Diagnosis Date  . Complication of anesthesia    cannot take anectine  . Inguinal hernia   . No pertinent past medical history     Patient Active Problem List   Diagnosis Date Noted  . History of left indirect inguinal hernia and lap repair left femoral  hernia 11/22/2012    Past Surgical History:  Procedure Laterality Date  . ABDOMINAL HYSTERECTOMY    . DENTAL SURGERY  Jan 11, 2011   bone graft and extraction  . EYE SURGERY     detached retina, cryo, lens implant  . INGUINAL HERNIA REPAIR  09/27/2011   Procedure: HERNIA REPAIR INGUINAL INCARCERATED;  Surgeon: Pedro Earls, MD;  Location: [facility];  Service: General;  Laterality: Left;  Left Ingunial hernia repair with mesh  . INGUINAL HERNIA REPAIR Left 10/19/2012   Procedure: LAPAROSCOPIC possible open left femoral hernia repair ;  Surgeon: Pedro Earls, MD;  Location: [facility];  Service: General;  Laterality: Left;  Laparoscopic Left Femoral Hernia Repair with Mesh  . MOUTH SURGERY     lt upper bone graft for implants  . TONSILLECTOMY    . TUBAL LIGATION       OB History   No obstetric history on file.     No family history on file.  Social History   Tobacco Use  . Smoking status: Former Smoker    Quit date: 09/21/1970    Years since quitting: 48.9  . Smokeless tobacco: Never Used  Substance Use Topics  . Alcohol use: Yes    Alcohol/week: 2.0 standard drinks    Types: 2 Glasses of wine per week  Comment: daily  . Drug use: No    Home Medications Prior to Admission medications   Medication Sig Start Date End Date Taking? Authorizing Provider  acetaminophen (TYLENOL) 325 MG tablet Take 2 tablets (650 mg total) by mouth every 4 (four) hours as needed. Patient not taking: Reported on 10/20/2017 10/20/12   Excell Seltzer, MD  Calcium Carbonate-Vitamin D (CALCIUM 600 + D PO) Take 1 tablet by mouth every other day.     [provider]  clindamycin (CLEOCIN) 150 MG capsule Take 2 capsules (300 mg total) by mouth 3 (three) times daily. 10/20/17   Kirichenko, Lahoma Rocker, PA-C  hydrochlorothiazide (HYDRODIURIL) 25 MG tablet Take 25 mg by mouth daily before breakfast.  08/11/11   [provider]  lidocaine (LIDODERM) 5 % Place 1 patch onto  the skin daily. Remove & Discard patch within 12 hours or as directed by MD Patient not taking: Reported on 10/20/2017 05/13/17   Long, Wonda Olds, MD  Magnesium 250 MG TABS Take 1 tablet by mouth daily.    [provider]  PREMARIN 0.45 MG tablet Take 0.45 mg by mouth daily.  08/11/11   [provider]    Allergies    Codeine, Hydrocodone, and Succinylcholine chloride  Review of Systems   Review of Systems  Constitutional: Negative for activity change, appetite change and fever.  HENT: Negative for congestion and rhinorrhea.   Eyes: Negative for visual disturbance.  Respiratory: Negative for cough, chest tightness and shortness of breath.   Cardiovascular: Negative for chest pain and palpitations.  Gastrointestinal: Negative for abdominal pain, nausea and vomiting.  Genitourinary: Negative for dysuria and hematuria.  Musculoskeletal: Negative for arthralgias and myalgias.  Skin: Positive for wound. Negative for rash.  Neurological: Negative for dizziness, weakness and headaches.   all other systems are negative except as noted in the HPI and PMH.   Physical Exam Updated Vital Signs BP (!) 140/99   Pulse 71   Temp 97.9 F (36.6 C) (Oral)   Resp 15   Ht 5\' 4"  (1.626 m)   Wt 49 kg   SpO2 100%   BMI 18.54 kg/m   Physical Exam Vitals and nursing note reviewed.  Constitutional:      General: She is not in acute distress.    Appearance: She is well-developed.     Comments: anxious  HENT:     Head: Normocephalic and atraumatic.     Mouth/Throat:     Pharynx: No oropharyngeal exudate.  Eyes:     Conjunctiva/sclera: Conjunctivae normal.     Pupils: Pupils are equal, round, and reactive to light.  Neck:     Comments: No C spine tenderness Cardiovascular:     Rate and Rhythm: Normal rate. Rhythm irregular.     Heart sounds: Normal heart sounds. No murmur.  Pulmonary:     Effort: Pulmonary effort is normal. No respiratory distress.     Breath sounds: Normal  breath sounds.  Abdominal:     Palpations: Abdomen is soft.     Tenderness: There is no abdominal tenderness. There is no guarding or rebound.  Musculoskeletal:        General: Tenderness present. Normal range of motion.     Cervical back: Normal range of motion and neck supple.     Comments: Ecchymosis and skin tear to left posterior arm.  Sutures in place.  Left wrist splint in place.  Skin:    General: Skin is warm.     Capillary Refill: Capillary  refill takes less than 2 seconds.  Neurological:     General: No focal deficit present.     Mental Status: She is alert and oriented to person, place, and time. Mental status is at baseline.     Cranial Nerves: No cranial nerve deficit.     Motor: No abnormal muscle tone.     Coordination: Coordination normal.     Comments:  5/5 strength throughout. CN 2-12 intact.Equal grip strength.   Psychiatric:        Behavior: Behavior normal.     ED Results / Procedures / Treatments   Labs (all labs ordered are listed, but only abnormal results are displayed) Labs Reviewed  CBC WITH DIFFERENTIAL/PLATELET - Abnormal; Notable for the following components:      Result Value   WBC 12.4 (*)    RBC 3.66 (*)    HCT 35.5 (*)    Neutro Abs 10.7 (*)    All other components within normal limits  BASIC METABOLIC PANEL - Abnormal; Notable for the following components:   Sodium 128 (*)    Chloride 93 (*)    Glucose, Bld 117 (*)    All other components within normal limits  BRAIN NATRIURETIC PEPTIDE - Abnormal; Notable for the following components:   B Natriuretic Peptide 137.3 (*)    All other components within normal limits  TSH  TROPONIN I (HIGH SENSITIVITY)  TROPONIN I (HIGH SENSITIVITY)    EKG None  Radiology DG Elbow Complete Left  Result Date: 08/16/2019 CLINICAL DATA:  Fall.  Laceration. EXAM: LEFT ELBOW - COMPLETE 3+ VIEW COMPARISON:  None. FINDINGS: There is no evidence of fracture, dislocation, or joint effusion. There is no  evidence of arthropathy or other focal bone abnormality. Soft tissues are unremarkable. IMPRESSION: Negative. Electronically Signed   By: Dorise Bullion III M.D   On: 08/16/2019 13:12   DG Wrist Complete Left  Result Date: 08/16/2019 CLINICAL DATA:  Left wrist swelling/pain, fall EXAM: LEFT WRIST - COMPLETE 3+ VIEW COMPARISON:  None. FINDINGS: Transverse distal radial shaft fracture. No definite intra-articular extension. Ulnar styloid is intact. Mild soft tissue swelling. IMPRESSION: Transverse distal radial shaft fracture. Electronically Signed   By: Julian Hy M.D.   On: 08/16/2019 10:22   DG Chest Portable 1 View  Result Date: 08/16/2019 CLINICAL DATA:  [place] last night and broke her wrist, laceration EXAM: PORTABLE CHEST 1 VIEW COMPARISON:  Portable exam 1242 hours compared to 05/13/2017 FINDINGS: Normal heart size, mediastinal contours, and pulmonary vascularity. Atherosclerotic calcification aorta. Lungs hyperinflated but clear. No pulmonary infiltrate, pleural effusion or pneumothorax. Bones demineralized. IMPRESSION: No acute abnormalities. Aortic Atherosclerosis (ICD10-I70.0). Electronically Signed   By: Lavonia Dana M.D.   On: 08/16/2019 13:11    Procedures Procedures (including critical care time)  Medications Ordered in ED Medications - No data to display  ED Course  I have reviewed the triage vital signs and the nursing notes.  Pertinent labs & imaging results that were available during my care of the patient were reviewed by me and considered in my medical decision making (see chart for details).    MDM Rules/Calculators/A&P     CHA2DS2/VAS Stroke Risk Points  Current as of 56 minutes ago     3 >= 2 Points: High Risk  1 - 1.99 Points: Medium Risk  0 Points: Low Risk    The patient's score has not changed in the past year.: No Change     Details    This score determines  the patient's risk of having a stroke if the  patient has atrial fibrillation.       Points  Metrics  0 Has Congestive Heart Failure:  No    Current as of 56 minutes ago  0 Has Vascular Disease:  No    Current as of 56 minutes ago  0 Has Hypertension:  No    Current as of 56 minutes ago  2 Age:  78    Current as of 56 minutes ago  0 Has Diabetes:  No    Current as of 56 minutes ago  0 Had Stroke:  No  Had TIA:  No  Had thromboembolism:  No    Current as of 56 minutes ago  1 Female:  Yes    Current as of 56 minutes ago                         Patient from urgent care after fall last night. Had laceration that was repaired and distal radius fracture. Denies hitting head. Found to be in rate controlled atrial fibrillation which is apparently new.   Details of fall unclear, cannot rule out syncope. Doesn't think she tripped. But no proceeding dizziness, lightheadedness, chest pain, SOB.  Refuses EKG here as she had 2 at urgent care.   Labs with mild hyponatremia of 128. Recommend observation admission given possible syncope and new onset Afib. CHA2DS2-VASc Score score is 4 and anticoagulation recommended.  Patient adamantly refuses admission and doesn't want to be started on anticoagulation without talking to her PCP Dr. Reynaldo Minium.   She appears to have capacity to refuse treatment and admission. D/w that syncope from arrhythmia can be life threatening. And atrial fibrillation increases her risk of strokes.   D/w cardiology team and Dr. Angelena Form who agree with anticoagulation and observation admission for echo. No indication for rate control agent.  Patient still refuses and will leave AMA. She is alert and oriented x3 and appears to have capacity to make this decision. She understands that her risk of stroke is increased without anticoagulation and continues to refuse it. She also refuses observation for her fall versus syncope episode.  She knows to return at any time. Followup with her PCP for suture removal as well as hand surgery for her radius fracture.  Final Clinical  Impression(s) / ED Diagnoses Final diagnoses:  Fall, initial encounter  Paroxysmal A-fib [facility])    Rx / DC Orders ED Discharge Orders    None       Aaronmichael Brumbaugh, Annie Main, MD 08/16/19 1712

## 2019-08-16 NOTE — Discharge Instructions (Addendum)
Please go to the ER now for further evaluation and management of your atrial fibrillation, especially since you fell.  Please wear wrist splint as provided at all times until seen and evaluated by orthopedics.  May call Dr. Carlean Jews office today to let them know you were seen here and found to have a distal radius fracture, to set up follow up appointment.  Keep dressing in place for the next 24 hours.  Then may remove, cleanse wounds daily with soap and water, pat dry, and keep covered.  Return here or with your Pcp in 10 days for removal of sutures.  Ice, elevate, tylenol or motrin as needed for pain.  Please return sooner if significant redness, drainage or pus from the wounds if concerned about infection.

## 2019-08-16 NOTE — ED Notes (Signed)
Pt ambulated to/from the restroom. No issues observed.

## 2019-08-16 NOTE — ED Notes (Signed)
Patient noted to be in new onset afib, provider notified.

## 2019-08-16 NOTE — ED Notes (Addendum)
Patient is being discharged from the Urgent [facility] and sent to the Emergency Department. Per Lanelle Bal, patient is stable but in need of higher level of care due to new onset afib and fall last night (patient's wound sutured during [facility] visit). Patient is aware and verbalizes understanding of plan of care.  Vitals:   08/16/19 0931  BP: (!) 184/90  Pulse: 93  Resp: 16  Temp: 97.6 F (36.4 C)  SpO2: 93%

## 2019-08-20 DIAGNOSIS — I10 Essential (primary) hypertension: Secondary | ICD-10-CM | POA: Diagnosis not present

## 2019-08-21 ENCOUNTER — Other Ambulatory Visit (HOSPITAL_COMMUNITY): Payer: Self-pay | Admitting: Family Medicine

## 2019-08-21 DIAGNOSIS — I480 Paroxysmal atrial fibrillation: Secondary | ICD-10-CM

## 2019-08-28 ENCOUNTER — Ambulatory Visit (HOSPITAL_COMMUNITY): Payer: Medicare Other | Attending: Cardiology

## 2019-08-28 ENCOUNTER — Other Ambulatory Visit: Payer: Self-pay

## 2019-08-28 DIAGNOSIS — I480 Paroxysmal atrial fibrillation: Secondary | ICD-10-CM | POA: Diagnosis present

## 2019-09-26 DIAGNOSIS — Z1331 Encounter for screening for depression: Secondary | ICD-10-CM | POA: Diagnosis not present

## 2019-09-26 DIAGNOSIS — I10 Essential (primary) hypertension: Secondary | ICD-10-CM | POA: Diagnosis not present

## 2019-09-26 DIAGNOSIS — H269 Unspecified cataract: Secondary | ICD-10-CM | POA: Diagnosis not present

## 2019-09-26 DIAGNOSIS — I480 Paroxysmal atrial fibrillation: Secondary | ICD-10-CM | POA: Diagnosis not present

## 2019-09-27 DIAGNOSIS — S52522A Torus fracture of lower end of left radius, initial encounter for closed fracture: Secondary | ICD-10-CM | POA: Diagnosis not present

## 2019-10-29 DIAGNOSIS — S52522D Torus fracture of lower end of left radius, subsequent encounter for fracture with routine healing: Secondary | ICD-10-CM | POA: Diagnosis not present

## 2019-11-18 ENCOUNTER — Ambulatory Visit: Payer: Medicare Other | Attending: Internal Medicine

## 2019-11-18 DIAGNOSIS — Z23 Encounter for immunization: Secondary | ICD-10-CM

## 2019-11-18 NOTE — Progress Notes (Signed)
   Covid-19 Vaccination Clinic  Name:  Gail Jennings    MRN: [id number] DOB: Oct 19, 1932  11/18/2019  [name] was observed post Covid-19 immunization for 15 minutes without incident. She was provided with Vaccine Information Sheet and instruction to access the V-Safe system.   [name] was instructed to call 911 with any severe reactions post vaccine: Marland Kitchen Difficulty breathing  . Swelling of face and throat  . A fast heartbeat  . A bad rash all over body  . Dizziness and weakness   Immunizations Administered    Name Date Dose VIS Date Route   Pfizer COVID-19 Vaccine 11/18/2019  3:31 PM 0.3 mL 08/16/2019 Intramuscular   Manufacturer: [place]   Lot: [id number]   [place]: [address]-5

## 2019-12-11 ENCOUNTER — Ambulatory Visit: Payer: Medicare Other | Attending: Internal Medicine

## 2019-12-11 DIAGNOSIS — Z23 Encounter for immunization: Secondary | ICD-10-CM

## 2019-12-11 NOTE — Progress Notes (Signed)
   Covid-19 Vaccination Clinic  Name:  Gail Jennings    MRN: [id number] DOB: 25-Oct-1932  12/11/2019  [name] was observed post Covid-19 immunization for 15 minutes without incident. She was provided with Vaccine Information Sheet and instruction to access the V-Safe system.   [name] was instructed to call 911 with any severe reactions post vaccine: Marland Kitchen Difficulty breathing  . Swelling of face and throat  . A fast heartbeat  . A bad rash all over body  . Dizziness and weakness   Immunizations Administered    Name Date Dose VIS Date Route   Pfizer COVID-19 Vaccine 12/11/2019  1:14 PM 0.3 mL 08/16/2019 Intramuscular   Manufacturer: [employer]   Lot: [id number]   [place]: [address]-1

## 2019-12-24 DIAGNOSIS — S52522D Torus fracture of lower end of left radius, subsequent encounter for fracture with routine healing: Secondary | ICD-10-CM | POA: Diagnosis not present

## 2020-04-23 DIAGNOSIS — Z961 Presence of intraocular lens: Secondary | ICD-10-CM | POA: Diagnosis not present

## 2020-04-23 DIAGNOSIS — H33312 Horseshoe tear of retina without detachment, left eye: Secondary | ICD-10-CM | POA: Diagnosis not present

## 2020-04-23 DIAGNOSIS — Z9889 Other specified postprocedural states: Secondary | ICD-10-CM | POA: Diagnosis not present

## 2020-04-23 DIAGNOSIS — H25812 Combined forms of age-related cataract, left eye: Secondary | ICD-10-CM | POA: Diagnosis not present

## 2020-04-23 DIAGNOSIS — H33013 Retinal detachment with single break, bilateral: Secondary | ICD-10-CM | POA: Diagnosis not present

## 2020-04-23 DIAGNOSIS — Z9842 Cataract extraction status, left eye: Secondary | ICD-10-CM | POA: Diagnosis not present

## 2020-06-10 DIAGNOSIS — H2512 Age-related nuclear cataract, left eye: Secondary | ICD-10-CM | POA: Diagnosis not present

## 2020-06-10 DIAGNOSIS — Z961 Presence of intraocular lens: Secondary | ICD-10-CM | POA: Diagnosis not present

## 2020-06-18 DIAGNOSIS — D224 Melanocytic nevi of scalp and neck: Secondary | ICD-10-CM | POA: Diagnosis not present

## 2020-06-18 DIAGNOSIS — D225 Melanocytic nevi of trunk: Secondary | ICD-10-CM | POA: Diagnosis not present

## 2020-06-18 DIAGNOSIS — D2262 Melanocytic nevi of left upper limb, including shoulder: Secondary | ICD-10-CM | POA: Diagnosis not present

## 2020-06-18 DIAGNOSIS — L821 Other seborrheic keratosis: Secondary | ICD-10-CM | POA: Diagnosis not present

## 2020-06-18 DIAGNOSIS — D692 Other nonthrombocytopenic purpura: Secondary | ICD-10-CM | POA: Diagnosis not present

## 2020-06-18 DIAGNOSIS — Z85828 Personal history of other malignant neoplasm of skin: Secondary | ICD-10-CM | POA: Diagnosis not present

## 2020-06-18 DIAGNOSIS — D1801 Hemangioma of skin and subcutaneous tissue: Secondary | ICD-10-CM | POA: Diagnosis not present

## 2020-07-06 DIAGNOSIS — I10 Essential (primary) hypertension: Secondary | ICD-10-CM | POA: Diagnosis not present

## 2020-07-13 DIAGNOSIS — Z0000 Encounter for general adult medical examination without abnormal findings: Secondary | ICD-10-CM | POA: Diagnosis not present

## 2020-07-13 DIAGNOSIS — Z1331 Encounter for screening for depression: Secondary | ICD-10-CM | POA: Diagnosis not present

## 2020-07-13 DIAGNOSIS — Z1339 Encounter for screening examination for other mental health and behavioral disorders: Secondary | ICD-10-CM | POA: Diagnosis not present

## 2020-07-13 DIAGNOSIS — I480 Paroxysmal atrial fibrillation: Secondary | ICD-10-CM | POA: Diagnosis not present

## 2020-07-13 DIAGNOSIS — R82998 Other abnormal findings in urine: Secondary | ICD-10-CM | POA: Diagnosis not present

## 2020-07-13 DIAGNOSIS — H269 Unspecified cataract: Secondary | ICD-10-CM | POA: Diagnosis not present

## 2020-07-13 DIAGNOSIS — Z23 Encounter for immunization: Secondary | ICD-10-CM | POA: Diagnosis not present

## 2020-07-13 DIAGNOSIS — I10 Essential (primary) hypertension: Secondary | ICD-10-CM | POA: Diagnosis not present

## 2020-07-29 DIAGNOSIS — H2512 Age-related nuclear cataract, left eye: Secondary | ICD-10-CM | POA: Diagnosis not present

## 2020-07-29 DIAGNOSIS — Z01818 Encounter for other preprocedural examination: Secondary | ICD-10-CM | POA: Diagnosis not present

## 2020-07-29 DIAGNOSIS — Z1212 Encounter for screening for malignant neoplasm of rectum: Secondary | ICD-10-CM | POA: Diagnosis not present

## 2020-08-06 DIAGNOSIS — H2512 Age-related nuclear cataract, left eye: Secondary | ICD-10-CM | POA: Diagnosis not present

## 2020-08-06 DIAGNOSIS — H25812 Combined forms of age-related cataract, left eye: Secondary | ICD-10-CM | POA: Diagnosis not present

## 2020-08-06 DIAGNOSIS — I10 Essential (primary) hypertension: Secondary | ICD-10-CM | POA: Diagnosis not present

## 2020-08-07 DIAGNOSIS — Z961 Presence of intraocular lens: Secondary | ICD-10-CM | POA: Diagnosis not present

## 2020-08-07 DIAGNOSIS — Z9889 Other specified postprocedural states: Secondary | ICD-10-CM | POA: Diagnosis not present

## 2020-08-07 DIAGNOSIS — Z8669 Personal history of other diseases of the nervous system and sense organs: Secondary | ICD-10-CM | POA: Diagnosis not present

## 2020-08-07 DIAGNOSIS — Z48810 Encounter for surgical aftercare following surgery on the sense organs: Secondary | ICD-10-CM | POA: Diagnosis not present

## 2020-12-11 DIAGNOSIS — Z9842 Cataract extraction status, left eye: Secondary | ICD-10-CM | POA: Diagnosis not present

## 2020-12-11 DIAGNOSIS — Z961 Presence of intraocular lens: Secondary | ICD-10-CM | POA: Diagnosis not present

## 2020-12-11 DIAGNOSIS — Z9889 Other specified postprocedural states: Secondary | ICD-10-CM | POA: Diagnosis not present

## 2020-12-11 DIAGNOSIS — H26492 Other secondary cataract, left eye: Secondary | ICD-10-CM | POA: Diagnosis not present

## 2020-12-11 DIAGNOSIS — Z9841 Cataract extraction status, right eye: Secondary | ICD-10-CM | POA: Diagnosis not present

## 2020-12-11 DIAGNOSIS — Z8669 Personal history of other diseases of the nervous system and sense organs: Secondary | ICD-10-CM | POA: Diagnosis not present

## 2021-02-10 DIAGNOSIS — I499 Cardiac arrhythmia, unspecified: Secondary | ICD-10-CM | POA: Diagnosis not present

## 2021-02-10 DIAGNOSIS — Z23 Encounter for immunization: Secondary | ICD-10-CM | POA: Diagnosis not present

## 2021-02-10 DIAGNOSIS — L600 Ingrowing nail: Secondary | ICD-10-CM | POA: Diagnosis not present

## 2021-02-10 DIAGNOSIS — I700 Atherosclerosis of aorta: Secondary | ICD-10-CM | POA: Diagnosis not present

## 2021-02-10 DIAGNOSIS — I480 Paroxysmal atrial fibrillation: Secondary | ICD-10-CM | POA: Diagnosis not present

## 2021-02-10 DIAGNOSIS — I10 Essential (primary) hypertension: Secondary | ICD-10-CM | POA: Diagnosis not present

## 2021-03-01 ENCOUNTER — Other Ambulatory Visit: Payer: Self-pay

## 2021-03-01 ENCOUNTER — Ambulatory Visit (INDEPENDENT_AMBULATORY_CARE_PROVIDER_SITE_OTHER): Payer: Medicare Other | Admitting: Podiatry

## 2021-03-01 DIAGNOSIS — L84 Corns and callosities: Secondary | ICD-10-CM

## 2021-03-01 DIAGNOSIS — M79674 Pain in right toe(s): Secondary | ICD-10-CM

## 2021-03-01 DIAGNOSIS — B351 Tinea unguium: Secondary | ICD-10-CM | POA: Diagnosis not present

## 2021-03-01 NOTE — Progress Notes (Signed)
   Subjective: 87 y.o. female presenting today as a new patient for evaluation of discoloration with thickening to the right hallux nail plate.  Patient states that when she was 51 years old someone stepped on her toenail and is never been the same since.  She has had other physicians remove the nail in the past and it is grown back thick and discolored.  She presents for further treatment evaluation  Past Medical History:  Diagnosis Date   Complication of anesthesia    cannot take anectine   Inguinal hernia    No pertinent past medical history     Objective: Physical Exam General: The patient is alert and oriented x3 in no acute distress.  Dermatology: Hyperkeratotic, discolored, thickened, onychodystrophy noted to the right hallux nail plate.  There is also some hyperkeratotic callus tissue to the interdigital space medial aspect of the right second toe consistent with interdigital corn. Skin is warm, dry and supple bilateral lower extremities. Negative for open lesions or macerations.  Vascular: Palpable pedal pulses bilaterally. No edema or erythema noted. Capillary refill within normal limits.  Neurological: Epicritic and protective threshold grossly intact bilaterally.   Musculoskeletal Exam: Range of motion within normal limits to all pedal and ankle joints bilateral. Muscle strength 5/5 in all groups bilateral.   Assessment: #1 Onychomycosis/onychodystrophy right hallux nail plate #2 interdigital corn right second toe  Plan of Care:  #1 Patient was evaluated. #2 the patient is looking for conservative treatment options to help alleviate some of the discoloration and thickening to the nail.  Recommend Tolcylen antifungal topical that was provided here in the office #3 recommend OTC corn and callus remover to the interdigital corn daily #4 recommend wide fitting shoes that do not constrict the toebox area #5 return to clinic as needed   Edrick Kins, DPM [facility]  Dr. Edrick Kins, DPM    [address], [place] [address]                Office [phone]  Fax [phone]

## 2021-05-06 DIAGNOSIS — Z961 Presence of intraocular lens: Secondary | ICD-10-CM | POA: Diagnosis not present

## 2021-05-06 DIAGNOSIS — Z9889 Other specified postprocedural states: Secondary | ICD-10-CM | POA: Diagnosis not present

## 2021-05-06 DIAGNOSIS — H33312 Horseshoe tear of retina without detachment, left eye: Secondary | ICD-10-CM | POA: Diagnosis not present

## 2021-05-06 DIAGNOSIS — Z8669 Personal history of other diseases of the nervous system and sense organs: Secondary | ICD-10-CM | POA: Diagnosis not present

## 2021-07-14 DIAGNOSIS — I10 Essential (primary) hypertension: Secondary | ICD-10-CM | POA: Diagnosis not present

## 2021-07-14 DIAGNOSIS — I700 Atherosclerosis of aorta: Secondary | ICD-10-CM | POA: Diagnosis not present

## 2021-07-23 DIAGNOSIS — I480 Paroxysmal atrial fibrillation: Secondary | ICD-10-CM | POA: Diagnosis not present

## 2021-07-23 DIAGNOSIS — R82998 Other abnormal findings in urine: Secondary | ICD-10-CM | POA: Diagnosis not present

## 2021-07-23 DIAGNOSIS — Z1339 Encounter for screening examination for other mental health and behavioral disorders: Secondary | ICD-10-CM | POA: Diagnosis not present

## 2021-07-23 DIAGNOSIS — Z23 Encounter for immunization: Secondary | ICD-10-CM | POA: Diagnosis not present

## 2021-07-23 DIAGNOSIS — I700 Atherosclerosis of aorta: Secondary | ICD-10-CM | POA: Diagnosis not present

## 2021-07-23 DIAGNOSIS — I10 Essential (primary) hypertension: Secondary | ICD-10-CM | POA: Diagnosis not present

## 2021-07-23 DIAGNOSIS — Z0000 Encounter for general adult medical examination without abnormal findings: Secondary | ICD-10-CM | POA: Diagnosis not present

## 2021-07-23 DIAGNOSIS — Z1331 Encounter for screening for depression: Secondary | ICD-10-CM | POA: Diagnosis not present

## 2021-11-09 IMAGING — DX DG WRIST COMPLETE 3+V*L*
4 series · 4 of 4 positions shown · non-contrast
Comparison: None.

CLINICAL DATA: Left wrist swelling/pain, fall

EXAM:
LEFT WRIST - COMPLETE 3+ VIEW

[wrist pa]
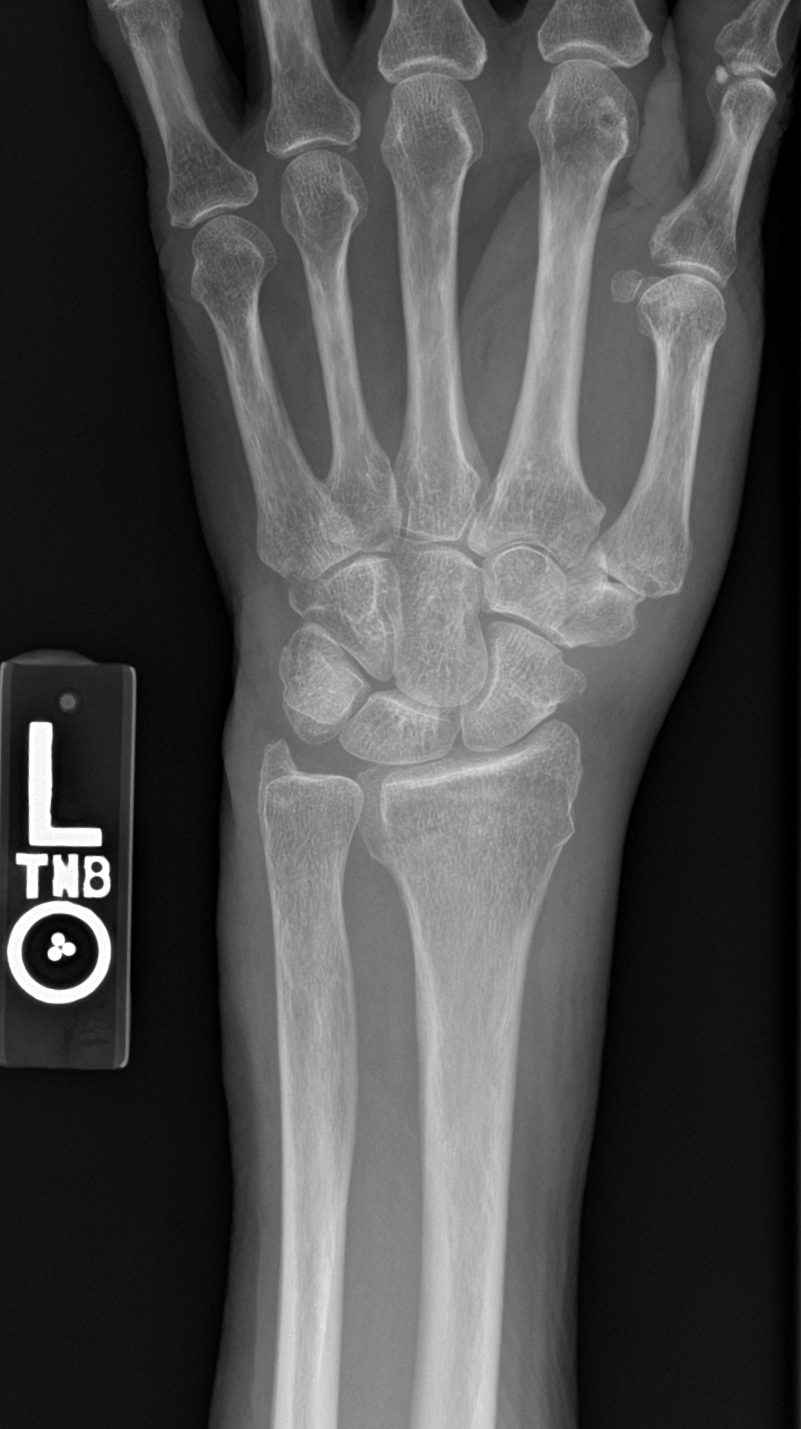

[wrist navicular]
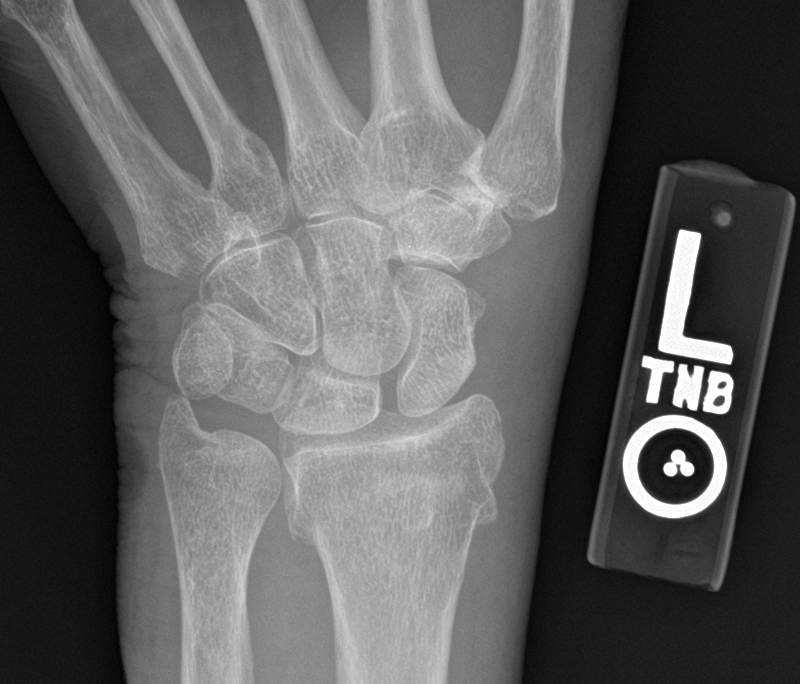

[wrist obl]
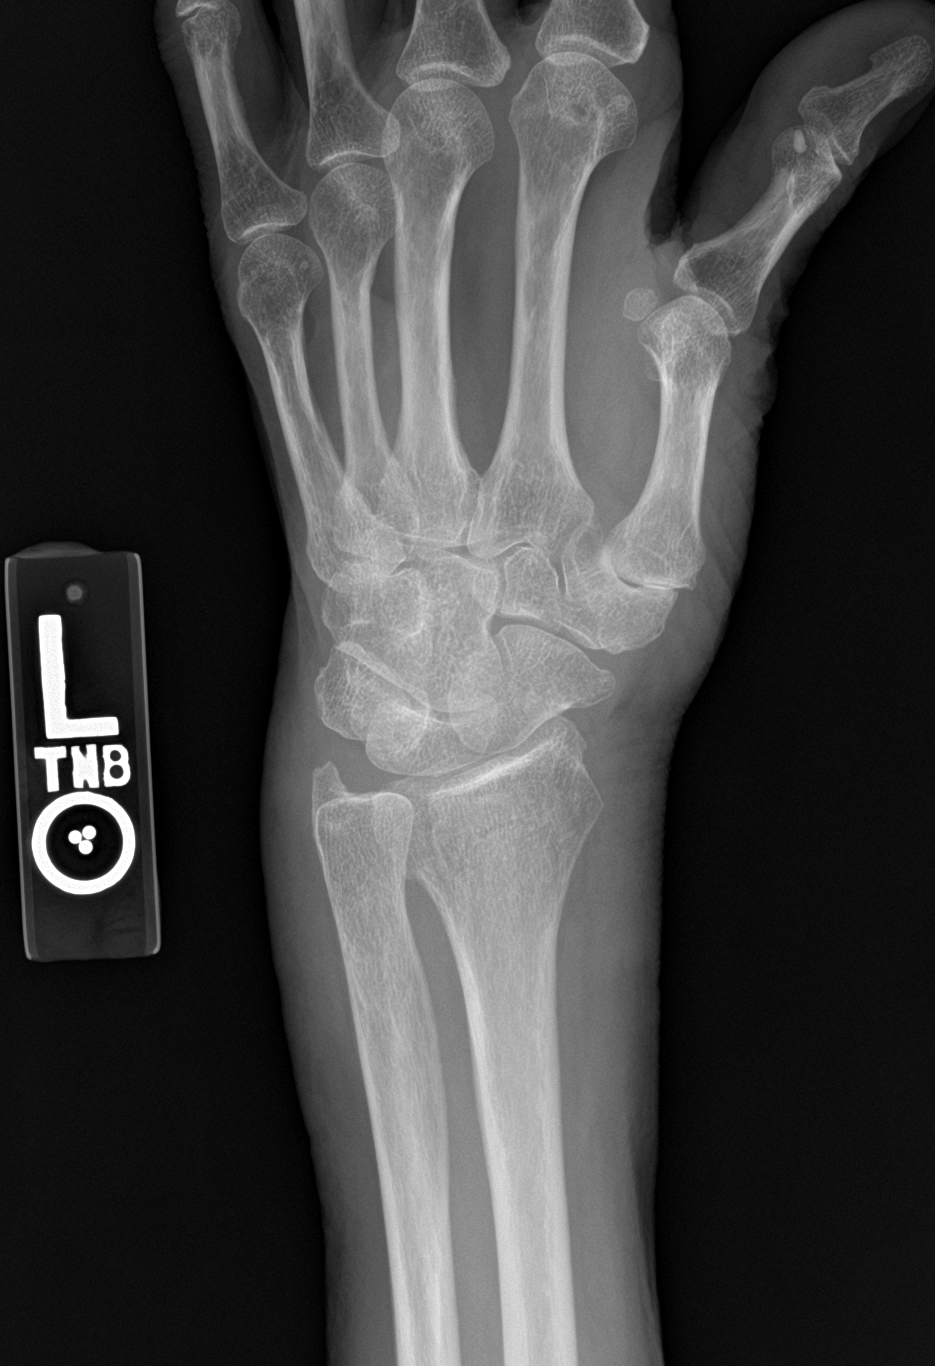

[wrist lat]
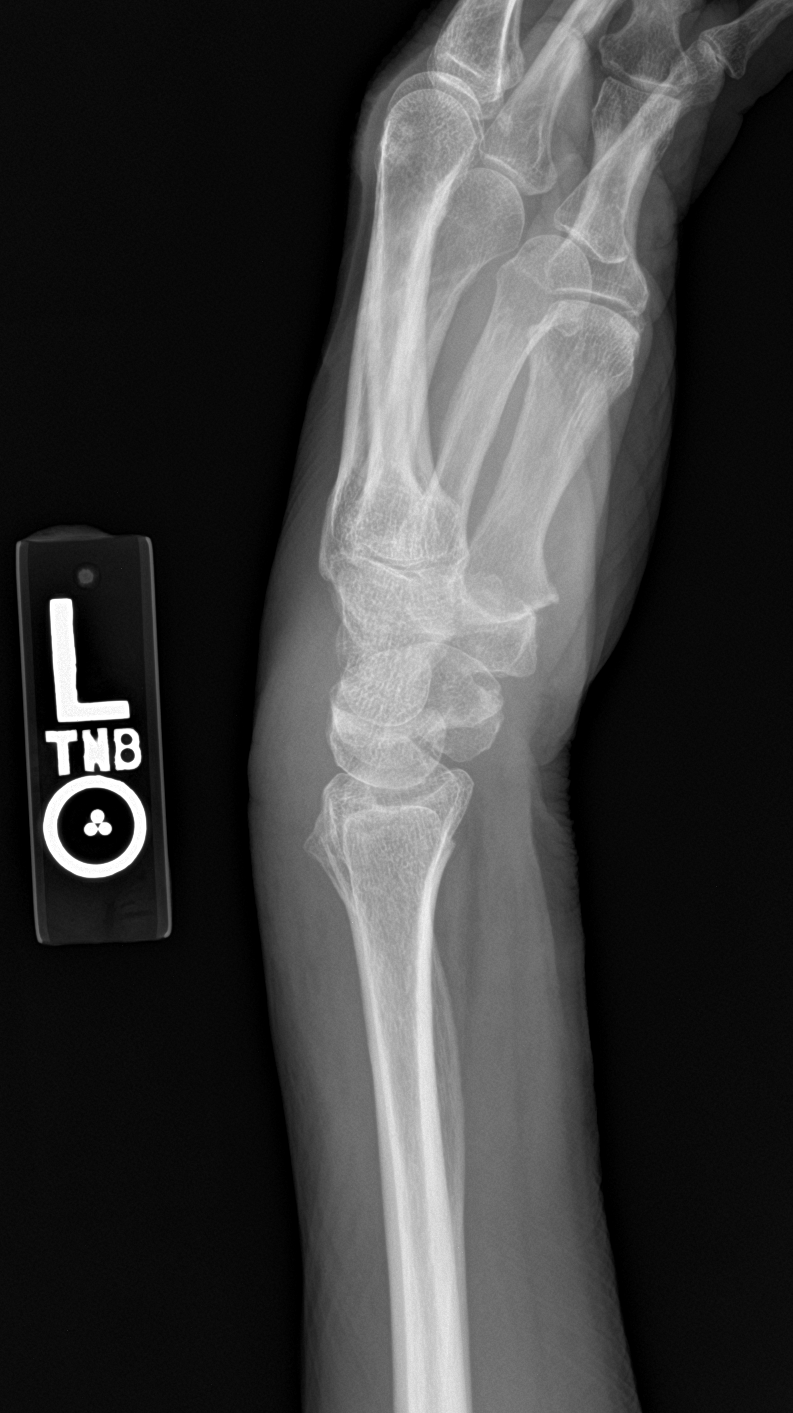

[4 of 4 positions shown; findings below may reference images not displayed]

FINDINGS: Transverse distal radial shaft fracture. No definite intra-articular
extension.

Ulnar styloid is intact.

Mild soft tissue swelling.
IMPRESSION: Transverse distal radial shaft fracture.

## 2021-11-09 IMAGING — DX DG CHEST 1V PORT
1 series · 1 of 1 positions shown · non-contrast
Comparison: Portable exam 0737 hours compared to 05/13/2017

CLINICAL DATA: Fell last night and broke her wrist, laceration

EXAM:
PORTABLE CHEST 1 VIEW

[chest ap]
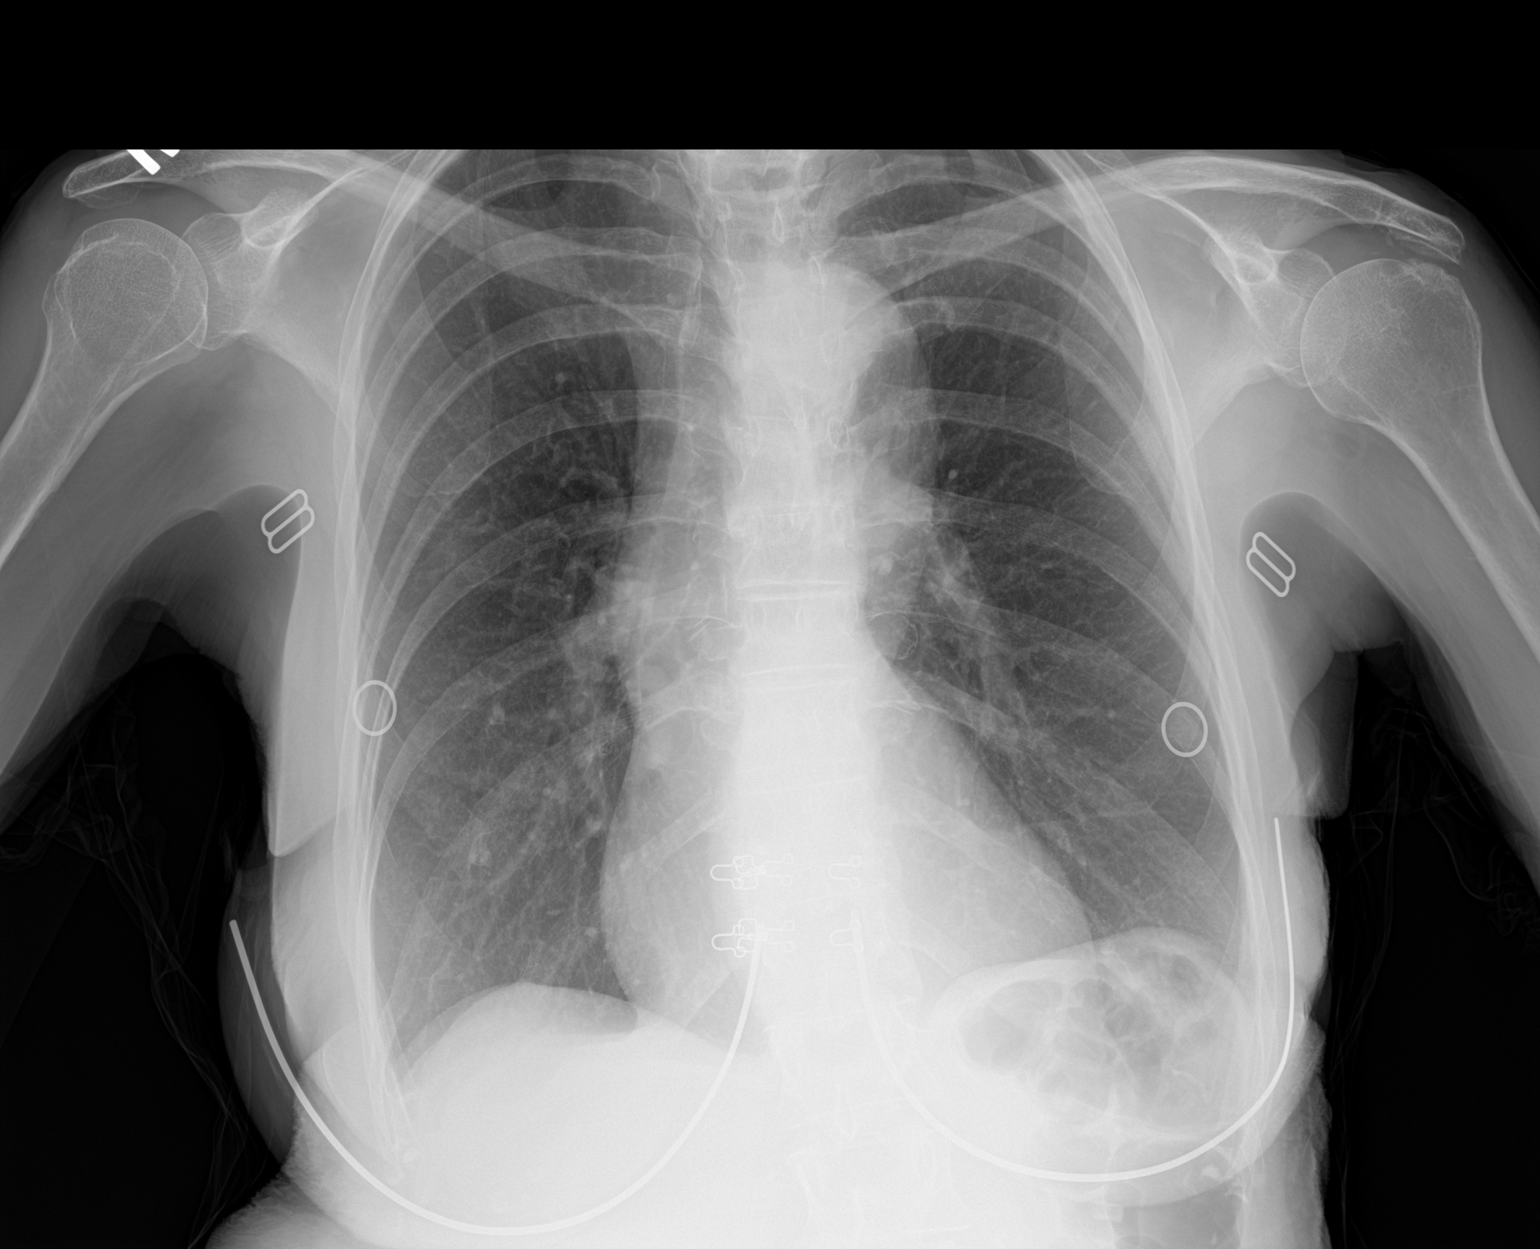

[1 of 1 positions shown; findings below may reference images not displayed]

FINDINGS: Normal heart size, mediastinal contours, and pulmonary vascularity.

Atherosclerotic calcification aorta.

Lungs hyperinflated but clear.

No pulmonary infiltrate, pleural effusion or pneumothorax.

Bones demineralized.
IMPRESSION: No acute abnormalities.

Aortic Atherosclerosis ([id number].A).

## 2022-01-24 DIAGNOSIS — I499 Cardiac arrhythmia, unspecified: Secondary | ICD-10-CM | POA: Diagnosis not present

## 2022-01-24 DIAGNOSIS — I480 Paroxysmal atrial fibrillation: Secondary | ICD-10-CM | POA: Diagnosis not present

## 2022-01-24 DIAGNOSIS — I700 Atherosclerosis of aorta: Secondary | ICD-10-CM | POA: Diagnosis not present

## 2022-01-24 DIAGNOSIS — I10 Essential (primary) hypertension: Secondary | ICD-10-CM | POA: Diagnosis not present

## 2022-03-29 DIAGNOSIS — Z23 Encounter for immunization: Secondary | ICD-10-CM | POA: Diagnosis not present

## 2022-05-04 DIAGNOSIS — Z8669 Personal history of other diseases of the nervous system and sense organs: Secondary | ICD-10-CM | POA: Diagnosis not present

## 2022-05-04 DIAGNOSIS — Z9889 Other specified postprocedural states: Secondary | ICD-10-CM | POA: Diagnosis not present

## 2022-05-04 DIAGNOSIS — Z961 Presence of intraocular lens: Secondary | ICD-10-CM | POA: Diagnosis not present

## 2022-07-09 ENCOUNTER — Encounter (HOSPITAL_COMMUNITY): Payer: Self-pay

## 2022-07-09 ENCOUNTER — Emergency Department (HOSPITAL_COMMUNITY): Payer: Medicare Other

## 2022-07-09 ENCOUNTER — Inpatient Hospital Stay (HOSPITAL_COMMUNITY): Payer: Medicare Other

## 2022-07-09 ENCOUNTER — Inpatient Hospital Stay (HOSPITAL_COMMUNITY)
Admission: EM | Admit: 2022-07-09 | Discharge: 2022-07-17 | DRG: 180 | Disposition: A | Discharge status: Home Health | Payer: Medicare Other | Attending: Family Medicine | Admitting: Family Medicine

## 2022-07-09 ENCOUNTER — Other Ambulatory Visit: Payer: Self-pay

## 2022-07-09 DIAGNOSIS — Z90710 Acquired absence of both cervix and uterus: Secondary | ICD-10-CM

## 2022-07-09 DIAGNOSIS — Z820 Family history of epilepsy and other diseases of the nervous system: Secondary | ICD-10-CM

## 2022-07-09 DIAGNOSIS — I499 Cardiac arrhythmia, unspecified: Secondary | ICD-10-CM | POA: Diagnosis not present

## 2022-07-09 DIAGNOSIS — J189 Pneumonia, unspecified organism: Secondary | ICD-10-CM | POA: Diagnosis not present

## 2022-07-09 DIAGNOSIS — J90 Pleural effusion, not elsewhere classified: Principal | ICD-10-CM | POA: Diagnosis present

## 2022-07-09 DIAGNOSIS — I480 Paroxysmal atrial fibrillation: Secondary | ICD-10-CM | POA: Diagnosis present

## 2022-07-09 DIAGNOSIS — E871 Hypo-osmolality and hyponatremia: Secondary | ICD-10-CM | POA: Diagnosis present

## 2022-07-09 DIAGNOSIS — J910 Malignant pleural effusion: Secondary | ICD-10-CM | POA: Diagnosis present

## 2022-07-09 DIAGNOSIS — Z20822 Contact with and (suspected) exposure to covid-19: Secondary | ICD-10-CM | POA: Diagnosis present

## 2022-07-09 DIAGNOSIS — R0603 Acute respiratory distress: Secondary | ICD-10-CM | POA: Diagnosis present

## 2022-07-09 DIAGNOSIS — R0602 Shortness of breath: Secondary | ICD-10-CM | POA: Diagnosis not present

## 2022-07-09 DIAGNOSIS — R64 Cachexia: Secondary | ICD-10-CM | POA: Diagnosis present

## 2022-07-09 DIAGNOSIS — Z87891 Personal history of nicotine dependence: Secondary | ICD-10-CM

## 2022-07-09 DIAGNOSIS — J9601 Acute respiratory failure with hypoxia: Secondary | ICD-10-CM | POA: Diagnosis present

## 2022-07-09 DIAGNOSIS — J9383 Other pneumothorax: Secondary | ICD-10-CM | POA: Diagnosis not present

## 2022-07-09 DIAGNOSIS — I10 Essential (primary) hypertension: Secondary | ICD-10-CM | POA: Diagnosis not present

## 2022-07-09 DIAGNOSIS — J948 Other specified pleural conditions: Secondary | ICD-10-CM | POA: Diagnosis present

## 2022-07-09 DIAGNOSIS — R54 Age-related physical debility: Secondary | ICD-10-CM | POA: Diagnosis present

## 2022-07-09 DIAGNOSIS — I4891 Unspecified atrial fibrillation: Secondary | ICD-10-CM | POA: Diagnosis present

## 2022-07-09 DIAGNOSIS — E778 Other disorders of glycoprotein metabolism: Secondary | ICD-10-CM | POA: Diagnosis present

## 2022-07-09 DIAGNOSIS — R609 Edema, unspecified: Secondary | ICD-10-CM | POA: Diagnosis not present

## 2022-07-09 DIAGNOSIS — R000 Tachycardia, unspecified: Secondary | ICD-10-CM | POA: Diagnosis not present

## 2022-07-09 DIAGNOSIS — R918 Other nonspecific abnormal finding of lung field: Secondary | ICD-10-CM | POA: Diagnosis not present

## 2022-07-09 DIAGNOSIS — C3491 Malignant neoplasm of unspecified part of right bronchus or lung: Principal | ICD-10-CM | POA: Diagnosis present

## 2022-07-09 DIAGNOSIS — R911 Solitary pulmonary nodule: Secondary | ICD-10-CM | POA: Diagnosis not present

## 2022-07-09 DIAGNOSIS — J9811 Atelectasis: Secondary | ICD-10-CM | POA: Diagnosis not present

## 2022-07-09 DIAGNOSIS — J939 Pneumothorax, unspecified: Secondary | ICD-10-CM | POA: Diagnosis not present

## 2022-07-09 HISTORY — DX: Essential (primary) hypertension: I10

## 2022-07-09 HISTORY — DX: Unspecified atrial fibrillation: I48.91

## 2022-07-09 LAB — CBC
HCT: 41.2 % (ref 36.0–46.0)
Hemoglobin: 14.5 g/dL (ref 12.0–15.0)
MCH: 34.3 pg — ABNORMAL HIGH (ref 26.0–34.0)
MCHC: 35.2 g/dL (ref 30.0–36.0)
MCV: 97.4 fL (ref 80.0–100.0)
Platelets: 416 10*3/uL — ABNORMAL HIGH (ref 150–400)
RBC: 4.23 MIL/uL (ref 3.87–5.11)
RDW: 12.2 % (ref 11.5–15.5)
WBC: 12.5 10*3/uL — ABNORMAL HIGH (ref 4.0–10.5)
nRBC: 0 % (ref 0.0–0.2)

## 2022-07-09 LAB — BODY FLUID CELL COUNT WITH DIFFERENTIAL
Eos, Fluid: 0 %
Lymphs, Fluid: 63 %
Monocyte-Macrophage-Serous Fluid: 32 % — ABNORMAL LOW (ref 50–90)
Neutrophil Count, Fluid: 5 % (ref 0–25)
Total Nucleated Cell Count, Fluid: 840 cu mm (ref 0–1000)

## 2022-07-09 LAB — BASIC METABOLIC PANEL
Anion gap: 9 (ref 5–15)
Anion gap: 10 (ref 5–15)
BUN: 17 mg/dL (ref 8–23)
BUN: 18 mg/dL (ref 8–23)
CO2: 24 mmol/L (ref 22–32)
CO2: 22 mmol/L (ref 22–32)
Calcium: 9 mg/dL (ref 8.9–10.3)
Calcium: 8.6 mg/dL — ABNORMAL LOW (ref 8.9–10.3)
Chloride: 95 mmol/L — ABNORMAL LOW (ref 98–111)
Chloride: 97 mmol/L — ABNORMAL LOW (ref 98–111)
Creatinine, Ser: 0.85 mg/dL (ref 0.44–1.00)
Creatinine, Ser: 0.72 mg/dL (ref 0.44–1.00)
GFR, Estimated: >60 mL/min (ref >60)
GFR, Estimated: >60 mL/min (ref >60)
Glucose, Bld: 134 mg/dL — ABNORMAL HIGH (ref 70–99)
Glucose, Bld: 106 mg/dL — ABNORMAL HIGH (ref 70–99)
Potassium: 4.7 mmol/L (ref 3.5–5.1)
Potassium: 5 mmol/L (ref 3.5–5.1)
Sodium: 128 mmol/L — ABNORMAL LOW (ref 135–145)
Sodium: 129 mmol/L — ABNORMAL LOW (ref 135–145)

## 2022-07-09 LAB — RESP PANEL BY RT-PCR (FLU A&B, COVID) ARPGX2
Influenza A by PCR: NEGATIVE
Influenza B by PCR: NEGATIVE
SARS Coronavirus 2 by RT PCR: NEGATIVE

## 2022-07-09 LAB — BRAIN NATRIURETIC PEPTIDE: B Natriuretic Peptide: 92.3 pg/mL (ref 0.0–100.0)

## 2022-07-09 LAB — TROPONIN I (HIGH SENSITIVITY): Troponin I (High Sensitivity): 11 ng/L (ref <18)

## 2022-07-09 LAB — LACTATE DEHYDROGENASE, PLEURAL OR PERITONEAL FLUID: LD, Fluid: 119 U/L — ABNORMAL HIGH (ref 3–23)

## 2022-07-09 LAB — GLUCOSE, PLEURAL OR PERITONEAL FLUID: Glucose, Fluid: 106 mg/dL

## 2022-07-09 LAB — C-REACTIVE PROTEIN: CRP: 0.7 mg/dL (ref <1.0)

## 2022-07-09 LAB — PROTEIN, TOTAL: Total Protein: 5 g/dL — ABNORMAL LOW (ref 6.5–8.1)

## 2022-07-09 LAB — LACTATE DEHYDROGENASE: LDH: 183 U/L (ref 98–192)

## 2022-07-09 MED ORDER — HYDRALAZINE HCL 20 MG/ML IJ SOLN: 5.0000 mg | INTRAMUSCULAR | Status: DC | PRN

## 2022-07-09 MED ORDER — SODIUM CHLORIDE 0.9 % IV SOLN
500.0000 mg | INTRAVENOUS | Status: DC
  Administered 2022-07-09 – 2022-07-13 (×5): 500 mg via INTRAVENOUS
  Filled 2022-07-09 (×6): qty 5

## 2022-07-09 MED ORDER — ACETAMINOPHEN 325 MG PO TABS: 650.0000 mg | ORAL_TABLET | Freq: Four times a day (QID) | ORAL | Status: DC | PRN

## 2022-07-09 MED ORDER — ALBUTEROL SULFATE (2.5 MG/3ML) 0.083% IN NEBU
2.5000 mg | INHALATION_SOLUTION | RESPIRATORY_TRACT | Status: DC | PRN
  Administered 2022-07-09 – 2022-07-10 (×2): 2.5 mg via RESPIRATORY_TRACT
  Filled 2022-07-09 (×2): qty 3

## 2022-07-09 MED ORDER — BISACODYL 5 MG PO TBEC: 5.0000 mg | DELAYED_RELEASE_TABLET | Freq: Every day | ORAL | Status: DC | PRN

## 2022-07-09 MED ORDER — ONDANSETRON HCL 4 MG/2ML IJ SOLN: 4.0000 mg | Freq: Four times a day (QID) | INTRAMUSCULAR | Status: DC | PRN

## 2022-07-09 MED ORDER — OXYCODONE HCL 5 MG PO TABS
5.0000 mg | ORAL_TABLET | ORAL | Status: DC | PRN
  Administered 2022-07-10: 5 mg via ORAL
  Filled 2022-07-09: qty 1

## 2022-07-09 MED ORDER — SODIUM CHLORIDE 0.9% FLUSH
3.0000 mL | Freq: Two times a day (BID) | INTRAVENOUS | Status: DC
  Administered 2022-07-10 – 2022-07-17 (×13): 3 mL via INTRAVENOUS

## 2022-07-09 MED ORDER — ONDANSETRON HCL 4 MG PO TABS: 4.0000 mg | ORAL_TABLET | Freq: Four times a day (QID) | ORAL | Status: DC | PRN

## 2022-07-09 MED ORDER — SODIUM CHLORIDE 0.9 % IV SOLN: INTRAVENOUS | Status: DC

## 2022-07-09 MED ORDER — DOCUSATE SODIUM 100 MG PO CAPS
100.0000 mg | ORAL_CAPSULE | Freq: Two times a day (BID) | ORAL | Status: DC
  Administered 2022-07-09 – 2022-07-17 (×15): 100 mg via ORAL
  Filled 2022-07-09 (×15): qty 1

## 2022-07-09 MED ORDER — SODIUM CHLORIDE 0.9 % IV SOLN
1.0000 g | Freq: Once | INTRAVENOUS | Status: AC
  Administered 2022-07-09: 1 g via INTRAVENOUS
  Filled 2022-07-09: qty 10

## 2022-07-09 MED ORDER — MORPHINE SULFATE (PF) 2 MG/ML IV SOLN: 2.0000 mg | INTRAVENOUS | Status: DC | PRN

## 2022-07-09 MED ORDER — METOPROLOL TARTRATE 5 MG/5ML IV SOLN: 5.0000 mg | INTRAVENOUS | Status: DC | PRN

## 2022-07-09 MED ORDER — GUAIFENESIN ER 600 MG PO TB12: 600.0000 mg | ORAL_TABLET | Freq: Two times a day (BID) | ORAL | Status: DC | PRN

## 2022-07-09 MED ORDER — SODIUM CHLORIDE 0.9 % IV SOLN
1.0000 g | INTRAVENOUS | Status: AC
  Administered 2022-07-10 – 2022-07-15 (×6): 1 g via INTRAVENOUS
  Filled 2022-07-09 (×6): qty 10

## 2022-07-09 MED ORDER — ACETAMINOPHEN 650 MG RE SUPP: 650.0000 mg | Freq: Four times a day (QID) | RECTAL | Status: DC | PRN

## 2022-07-09 MED ORDER — POLYETHYLENE GLYCOL 3350 17 G PO PACK: 17.0000 g | PACK | Freq: Every day | ORAL | Status: DC | PRN

## 2022-07-09 NOTE — ED Notes (Signed)
Dr. Nevada Crane returned call reference pt blood cultures not being drawn prior to antibiotics and fluids not started. Advised to hold off on starting maintenance fluids at this time until lab results are back

## 2022-07-09 NOTE — H&P (Signed)
History and Physical    Patient: Gail Jennings:[id number] DOB: 1933/01/31 DOA: 07/09/2022 DOS: the patient was seen and examined on 07/09/2022 PCP: Burnard Bunting, MD  Patient coming from: Home - lives alone; NOK: Friend, [phone]   Chief Complaint: SOB  HPI: Gail Jennings is a 88 y.o. female with medical history significant of HTN and atrial fibrillation presenting with SOB.  For about 7 days, she has been SOB and wheezing.  She was very tired and they called 911 yesterday.  EMS came and did some things to help her but they said if it wasn't better today then she should call 911 again.  She does not normally wheeze and that has been going on for about 4 days. +SON, does not usually wear O2.  A week ago,she was tired but still able to do her normal activities.  She goes to the gym daily.  No fevers.  Diarrhea a couple of times total and has felt nauseated a couple of times.  +cough, productive of white foam.  She can only breathe if she lies on her right side only. No LE edema.  No sick contacts.  She has been unable to go to the gym in over a week.    ER Course:  h/o afib, not on anything.  4 days of SOB, wheezing, respiratory distress, cough, diarrhea.  Large R pleural effusion with likely underlying consolidation.  Covered for CAP.  Hyponatremia, also had in 2020.       Review of Systems: As mentioned in the history of present illness. All other systems reviewed and are negative. Past Medical History:  Diagnosis Date   Atrial fibrillation ([place])    Complication of anesthesia    cannot take anectine   Hypertension    Inguinal hernia    Past Surgical History:  Procedure Laterality Date   ABDOMINAL HYSTERECTOMY     DENTAL SURGERY  Jan 11, 2011   bone graft and extraction   EYE SURGERY     detached retina, cryo, lens implant   INGUINAL HERNIA REPAIR  09/27/2011   Procedure: HERNIA REPAIR INGUINAL INCARCERATED;  Surgeon: Pedro Earls, MD;  Location: [facility];  Service: General;  Laterality: Left;  Left Ingunial hernia repair with mesh   INGUINAL HERNIA REPAIR Left 10/19/2012   Procedure: LAPAROSCOPIC possible open left femoral hernia repair ;  Surgeon: Pedro Earls, MD;  Location: [facility];  Service: General;  Laterality: Left;  Laparoscopic Left Femoral Hernia Repair with Mesh   MOUTH SURGERY     lt upper bone graft for implants   TONSILLECTOMY     TUBAL LIGATION     Social History:  reports that she quit smoking about 51 years ago. Her smoking use included cigarettes. She has a 10.00 pack-year smoking history. She has never used smokeless tobacco. She reports current alcohol use of about 2.0 standard drinks of alcohol per week. She reports that she does not use drugs.  Allergies  Allergen Reactions   Codeine Nausea And Vomiting   Hydrocodone Nausea And Vomiting   Succinylcholine Chloride     Prolonged paralysis    Family History  Problem Relation Age of Onset   Parkinson's disease Mother    Healthy Sister     Prior to Admission medications   Medication Sig Start Date End Date Taking? Authorizing Provider  acetaminophen (TYLENOL) 325 MG tablet Take 2 tablets (650 mg total) by mouth every 4 (four) hours as needed. Patient not  taking: Reported on 10/20/2017 10/20/12   Excell Seltzer, MD  Calcium Carbonate-Vitamin D (CALCIUM 600 + D PO) Take 1 tablet by mouth every other day.     [provider]  clindamycin (CLEOCIN) 150 MG capsule Take 2 capsules (300 mg total) by mouth 3 (three) times daily. 10/20/17   Kirichenko, Lahoma Rocker, PA-C  hydrochlorothiazide (HYDRODIURIL) 25 MG tablet Take 25 mg by mouth daily before breakfast.  08/11/11   [provider]  lidocaine (LIDODERM) 5 % Place 1 patch onto the skin daily. Remove & Discard patch within 12 hours or as directed by MD Patient not taking: Reported on 10/20/2017 05/13/17   Long, Wonda Olds, MD  Magnesium 250 MG TABS Take 1 tablet by mouth daily.    [provider]  PREMARIN 0.45 MG tablet Take 0.45 mg by mouth daily.  08/11/11   [provider]    Physical Exam: Vitals:   07/09/22 1232 07/09/22 1426 07/09/22 1815 07/09/22 1911  BP: (!) 142/108 (!) 186/106 137/73   Pulse: 87 86 66   Resp: (!) 30 17 (!) 28   Temp: (!) 97.5 F (36.4 C) (!) 97.5 F (36.4 C)  97.8 F (36.6 C)  SpO2: 95% 94% 98%   Weight:      Height:       General:  Appears ill, SOB, audibly wheezing; she is frail and cachectic Eyes:  PERRL, EOMI, normal lids, iris ENT:  grossly normal hearing, lips & tongue, mmm Neck:  no LAD, masses or thyromegaly Cardiovascular:  Irregularly irregular with periodic tachycardia. Tr LE edema.  Respiratory:   Diffuse wheezing, R chest with clearly diminished breath sounds.  Moderately increased respiratory effort. Abdomen:  soft, NT, ND Skin:  no rash or induration seen on limited exam Musculoskeletal:  grossly normal tone BUE/BLE, good ROM, no bony abnormality Psychiatric:  blunted/anxious mood and affect, speech fluent and appropriate, AOx3 Neurologic:  CN 2-12 grossly intact, moves all extremities in coordinated fashion   Radiological Exams on Admission: Independently reviewed - see discussion in A/P where applicable  DG CHEST PORT 1 VIEW  Result Date: 07/09/2022 CLINICAL DATA:  Status post thoracentesis. EXAM: PORTABLE CHEST 1 VIEW COMPARISON:  Radiograph earlier today. FINDINGS: Slight decreased size of right pleural effusion post thoracentesis. No pneumothorax. Persistent opacification of the lower 2/3 of right hemithorax likely represents a combination of pleural fluid and airspace disease. The left lung appears clear. Heart size is obscured. IMPRESSION: 1. Slight decreased size of right pleural effusion post thoracentesis. No pneumothorax. 2. Persistent opacification of the lower 2/3 of right hemithorax likely represents a combination of residual pleural fluid and airspace disease. Electronically Signed   By:  Keith Rake M.D.   On: 07/09/2022 18:07   DG Chest 2 View  Result Date: 07/09/2022 CLINICAL DATA:  Shortness of breath and wheezing. EXAM: CHEST - 2 VIEW COMPARISON:  08/16/2019 chest radiograph FINDINGS: A new large RIGHT pleural effusion is noted with probable underlying consolidation or atelectasis. The LEFT lung is clear. The visualized portions of the cardiomediastinal silhouette are stable. There is no evidence of pneumothorax or acute bony abnormality. IMPRESSION: New large RIGHT pleural effusion with probable underlying consolidation or atelectasis. Electronically Signed   By: Margarette Canada M.D.   On: 07/09/2022 14:27    EKG: Independently reviewed.  Afib with rate 84; no evidence of acute ischemia   Labs on Admission: I have personally reviewed the available labs and imaging studies at the time of the admission.  Pertinent labs:    Na++ 128 Glucose 134 HS troponin 11 WBC 12.5 Platelets 416 COVID/flu negative  Assessment and Plan: Principal Problem:   Respiratory distress Active Problems:   Atrial fibrillation with RVR (HCC)   Pleural effusion on right   Essential hypertension   Chronic hyponatremia    Respiratory distress -Patient with a week or more of progressive SOB, cough with white foamy sputum, wheezing -Influenza negative. -COVID-19 negative. -Imaging showed near opacification of her R lung with large effusion -Pulm was consulted and thoracentesis was performed with studies pending -Post-procedure CXR shows slight improvement with persistent opacification of the lower 2/3 of the R hemithorax -She may require repeat thoracentesis vs. Chest tube -She will need a chest CT for further evaluation once the effusion is improved -Underlying cause may be infection vs. Malignancy -Will treat for now with Rocephin/Azithromycin -Blood cultures were ordered but antibiotics were given first so this may affect the results -Sputum cultures are also pending -Will order  lower respiratory tract procalcitonin level.   >0.5 indicates infection and >>0.5 indicates more serious disease.  As the procalcitonin level normalizes, it will be reasonable to consider de-escalation of antibiotic coverage.  The sensitivity of procalcitonin is variable and should not be used alone to guide treatment. -Will add albuterol PRN -Will add Mucinex for cough -Will monitor on progressive care bed for now  Afib -Patient denies knowledge of afib and does not appear to have had f/u for this issue after it was diagnosed while she was hospitalized in 08/2019 -She is having PAF in the ER today -Anticipate improvement in rate post-thoracentesis, as her breathing improves -She is not on a rate-controlling medication at home and is also not on [facility] -She will need initiation of AC (heparin vs. Eliquis) once her pulmonary issues are improved and the plan for that issue is more clear -For now will add prn metoprolol for HR control as well as BP control  HTN -Hold olmesartan for now  Hyponatremia -Appears to be chronic -?SIADH related to underlying lung issue -No apparent symptoms -Will follow     Advance Care Planning:   Code Status: Full Code   Consults: Pulmonology; PT/OT  DVT Prophylaxis: SCDs until able to transition to Heparin vs. DOAC  Family Communication: Friend was present throughout evaluation  Severity of Illness: The appropriate patient status for this patient is INPATIENT. Inpatient status is judged to be reasonable and necessary in order to provide the required intensity of service to ensure the patient's safety. The patient's presenting symptoms, physical exam findings, and initial radiographic and laboratory data in the context of their chronic comorbidities is felt to place them at high risk for further clinical deterioration. Furthermore, it is not anticipated that the patient will be medically stable for discharge from the hospital within 2 midnights of admission.    * I certify that at the point of admission it is my clinical judgment that the patient will require inpatient hospital care spanning beyond 2 midnights from the point of admission due to high intensity of service, high risk for further deterioration and high frequency of surveillance required.*  Author: Karmen Bongo, MD 07/09/2022 7:54 PM  For on call review [email or web address].

## 2022-07-09 NOTE — ED Triage Notes (Signed)
Pt BIB [facility] from home c/o [facility] x2 days, worse with exertion. Per EMS pt was noted to be in a-fib with rates as high as 150. Pt has a 20g left forearm and received 20mg  of Cardizem and 300 NS.

## 2022-07-09 NOTE — ED Notes (Signed)
Patient transported to X-ray 

## 2022-07-09 NOTE — Consult Note (Signed)
NAME:  Gail Jennings, MRN:  [id number], DOB:  06-Dec-1932, LOS: 0 ADMISSION DATE:  07/09/2022, CONSULTATION DATE:  07/09/2022 REFERRING MD:  Lorin Mercy, EDP, CHIEF COMPLAINT:  Shortness of Breath    History of Present Illness:  88 year-old-female with significant past medical history per chart review of paroxysmal Atrial Fib (not on blood thinners) presents via EMS with complaints of worsening shortness of breath, cough w/ white sputum, x2 days following about a week of decreased activity tolerance. Her friend endorses hearing audible wheezing as well. She reports a recent history of nausea, diarrhea, and cough. EMS reported on their arrival she was in A. Fib in the 150s, and was given Cardizem 20mg  and 367mL NS bolus. On evaluation in the ED, she remains short of breath and is now in NSR.    CXR wl large right effusion PCCM asked to eval   Pertinent  Medical History  Paroxsymal A. Fib   Significant Hospital Events: Including procedures, antibiotic start and stop dates in addition to other pertinent events   Right thora 1200 ml out. Sent for analysis 11/4  Interim History / Subjective:  Feels better s/p thora   Objective   Blood pressure (!) 186/106, pulse 86, temperature (!) 97.5 F (36.4 C), resp. rate 17, height 5\' 3"  (1.6 m), weight 49 kg, SpO2 94 %.       No intake or output data in the 24 hours ending 07/09/22 1609 Filed Weights   07/09/22 1228  Weight: 49 kg    Examination: General: 88 year old female. Resting in stretcher now in no distress but did have marked accessory use and WOB prior to emergent thora  HENT: NCAT no JVD  Lungs: exp wheeze improved post-thora. Decreased on right still residual effusion by bedside US imaging  Cardiovascular: reg irreg  Abdomen: soft not tender  Extremities: trace LE edema  Neuro: awake and alert  GU: due to void   Resolved Hospital Problem list     Assessment & Plan:  Acute hypoxic respiratory failure 2/2  large right pleural  effusion. Etiology not clear. DDx: parapneumonic, HF, cancer.   Could be mix of any   Plan  - Blood cultures  - Sputum cultures  - Thoracentesis completed. Will send for complete analysis.  - there still appears to be sig residual effusion and looks boarderline exudate (grossly); needs CT imaging but I think we are unlikely to get ideal imaging w/ residual effusion  -will repeat CXR in am. If fluid is inflammatory will place Chest then proceed w. CT imaging, if non-inflammatory continue w/ diuresis  - Empiric antibiotics   Paroxsymal Atrial Fib. Plan  - Obtain ECHO  - Cardiac monitor  - Rate and rhythm control  - Cards consult - holding AC acutely will prob start heparin tomorrow after we figure out what we ned to do with on-going pleural analysis   Fluid and electrolyte imbalance. Plan  - BMP in AM  - Replace as appropriate  Best Practice (right click and "Reselect all SmartList Selections" daily)   Per primary   Past Medical History:  Diagnosis Date   Atrial fibrillation ([place])    Complication of anesthesia    cannot take anectine   Hypertension    Inguinal hernia    Family History  Problem Relation Age of Onset   Parkinson's disease Mother    Healthy Sister     Allergies  Allergen Reactions   Codeine Nausea And Vomiting   Hydrocodone Nausea And Vomiting  Succinylcholine Chloride     Prolonged paralysis   Scheduled Meds:  docusate sodium  100 mg Oral BID   sodium chloride flush  3 mL Intravenous Q12H   Continuous Infusions:  sodium chloride     azithromycin     [START ON 07/10/2022] cefTRIAXone (ROCEPHIN)  IV     PRN Meds:.acetaminophen **OR** acetaminophen, albuterol, bisacodyl, guaiFENesin, hydrALAZINE, morphine injection, ondansetron **OR** ondansetron (ZOFRAN) IV, oxyCODONE, polyethylene glycol  Review of Systems  Constitutional:  Positive for malaise/fatigue and weight loss. Negative for fever.  HENT: Negative.    Eyes: Negative.   Respiratory:   Positive for cough, sputum production, shortness of breath and wheezing.   Cardiovascular:  Positive for chest pain and leg swelling.  Gastrointestinal: Negative.   Genitourinary: Negative.   Musculoskeletal: Negative.   Skin: Negative.   Neurological: Negative.   Endo/Heme/Allergies: Negative.    Best Practice Per primary   Erick Colace ACNP-BC [place] Pager # [phone] OR # [phone] if no answer

## 2022-07-09 NOTE — ED Notes (Signed)
Lab at bedside at this time.  

## 2022-07-09 NOTE — ED Notes (Signed)
Received verbal report from [place] at this time

## 2022-07-09 NOTE — ED Provider Notes (Signed)
[facility] EM[facility]GENCY DEPARTMENT Provider Note   CSN: [id number] Arrival date & time: 07/09/22  1215     History  Chief Complaint  Patient presents with   Shortness of Breath    Gail Jennings is a 88 y.o. female with paroxysmal A-fib, presents with dyspnea, A-fib.   Presents with 4 days of shortness of breath, worse with exertion.  No history of similar.  Friend at bedside endorses hearing audible wheezing and rattling in her chest as well.  Patient does not take any blood thinners.  She does not have a history of COPD, no inhalers at home.  Patient denies  fever, chills, lightheadedness, falls, chest pain, abdominal pain, dysuria/hematuria, hematochezia/melena, leg swelling.  Patient does endorse intermittent nausea as well as nonbloody diarrhea.  No vomiting.  Endorses a cough of the same timeframe coughing up white sputum. EMS picked up patient be in A-fib with rates into the 150s, she received 20 mg Cardizem 300 normal saline bolus.  On my evaluation on the monitor in the room patient is in normal sinus rhythm.  Patient states that she does not have a history of A-fib though per chart review she does have history of paroxysmal A-fib.  Patient states that how she is breathing right now is how she has been breathing for 4 days.  Shortness of Breath      Home Medications Prior to Admission medications   Medication Sig Start Date End Date Taking? Authorizing Provider  acetaminophen (TYLENOL) 325 MG tablet Take 2 tablets (650 mg total) by mouth every 4 (four) hours as needed. Patient not taking: Reported on 10/20/2017 10/20/12   Excell Seltzer, MD  Calcium Carbonate-Vitamin D (CALCIUM 600 + D PO) Take 1 tablet by mouth every other day.     [provider]  clindamycin (CLEOCIN) 150 MG capsule Take 2 capsules (300 mg total) by mouth 3 (three) times daily. 10/20/17   Kirichenko, Lahoma Rocker, PA-C  hydrochlorothiazide (HYDRODIURIL) 25 MG tablet Take 25 mg by  mouth daily before breakfast.  08/11/11   [provider]  lidocaine (LIDODERM) 5 % Place 1 patch onto the skin daily. Remove & Discard patch within 12 hours or as directed by MD Patient not taking: Reported on 10/20/2017 05/13/17   Long, Wonda Olds, MD  Magnesium 250 MG TABS Take 1 tablet by mouth daily.    [provider]  PREMARIN 0.45 MG tablet Take 0.45 mg by mouth daily.  08/11/11   [provider]      Allergies    Codeine, Hydrocodone, and Succinylcholine chloride    Review of Systems   Review of Systems  Respiratory:  Positive for shortness of breath.    Review of systems negative for fever/chills.  A 10 point review of systems was performed and is negative unless otherwise reported in HPI.  Physical Exam Updated Vital Signs BP (!) 186/106 (BP Location: Right Arm)   Pulse 86   Temp (!) 97.5 F (36.4 C)   Resp 17   Ht 5\' 3"  (1.6 m)   Wt 49 kg   SpO2 94%   BMI 19.13 kg/m  Physical Exam General: Elderly appearing female, lying in bed HEENT: PERRLA, Sclera anicteric, MMM, trachea midline. Cardiology: RRR, no murmurs/rubs/gallops. BL radial and DP pulses equal bilaterally.  Resp: Mild respiratory distress with increased effort, normal rate. Audible wheezing heard across the room. Decreased breath sounds on R side with poor air movement. No wheezing heard on left.  Abd: Soft,  non-tender, non-distended. No rebound tenderness or guarding.  GU: Deferred. MSK: No peripheral edema or signs of trauma. Extremities without deformity or TTP. No cyanosis or clubbing. Skin: warm, dry. No rashes or lesions. Neuro: A&Ox4, CNs II-XII grossly intact. MAEs. Sensation grossly intact.  Psych: Normal mood and affect.   ED Results / Procedures / Treatments   Labs (all labs ordered are listed, but only abnormal results are displayed) Labs Reviewed  CBC - Abnormal; Notable for the following components:      Result Value   WBC 12.5 (*)    MCH 34.3 (*)    Platelets 416  (*)    All other components within normal limits  BASIC METABOLIC PANEL - Abnormal; Notable for the following components:   Sodium 128 (*)    Chloride 95 (*)    Glucose, Bld 134 (*)    All other components within normal limits  RESP PANEL BY RT-PCR (FLU A&B, COVID) ARPGX2  CULTURE, BLOOD (ROUTINE X 2)  CULTURE, BLOOD (ROUTINE X 2)  TROPONIN I (HIGH SENSITIVITY)  TROPONIN I (HIGH SENSITIVITY)    EKG EKG Interpretation  Date/Time:  Saturday July 09 2022 12:39:07 EDT Ventricular Rate:  84 PR Interval:  160 QRS Duration: 80 QT Interval:  358 QTC Calculation: 423 R Axis:   91 Text Interpretation: Normal sinus rhythm Rightward axis Borderline ECG When compared with ECG of 16-Aug-2019 09:53, PREVIOUS ECG IS PRESENT Confirmed by Cindee Lame [phone]) on 07/09/2022 2:58:01 PM  Radiology DG Chest 2 View  Result Date: 07/09/2022 CLINICAL DATA:  Shortness of breath and wheezing. EXAM: CHEST - 2 VIEW COMPARISON:  08/16/2019 chest radiograph FINDINGS: A new large RIGHT pleural effusion is noted with probable underlying consolidation or atelectasis. The LEFT lung is clear. The visualized portions of the cardiomediastinal silhouette are stable. There is no evidence of pneumothorax or acute bony abnormality. IMPRESSION: New large RIGHT pleural effusion with probable underlying consolidation or atelectasis. Electronically Signed   By: Margarette Canada M.D.   On: 07/09/2022 14:27    Procedures Procedures    Medications Ordered in ED Medications  cefTRIAXone (ROCEPHIN) 1 g in sodium chloride 0.9 % 100 mL IVPB (has no administration in time range)  azithromycin (ZITHROMAX) 500 mg in sodium chloride 0.9 % 250 mL IVPB (has no administration in time range)    ED Course/ Medical Decision Making/ A&P                          Medical Decision Making Risk Decision regarding hospitalization.   Patient in mild respiratory distress, on 3 L nasal cannula satting 97%.  She is able to talk in  phrases.  ith patient's decreased breath sounds in the right, greater concern for PTX, PNA, pleural effusion, will obtain chest x-ray.  Patient did have A-fib with EMS and does have previous EKGs that demonstrate A-fib, but she is in normal sinus rhythm at this time with rate in the 80s.  Consider ACS though EKG is nonischemic and will obtain a troponin.  Lower concern for PE as patient has had no leg swelling or chest pain, however if no other findings are demonstrated, will likely evaluate with a CT chest.  Patient has no history of heart failure though consider pulmonary edema or acute heart failure exacerbation though no swelling of her legs.  Also consider sepsis given patient's initial tachycardia and a low temperature 36.4 C  Lab significant for sodium 128, calcium 4.7, creatinine 0.85 up  from baseline 0.5-0.6.  Troponin 11.  WBC 12.5. CXR demonstrates large R pleural effusion with probable underlying consolidation/atelectasis.  Given patient's initial tachycardia, low temperature, white count, and likely source of infection, patient is at risk for sepsis.  Will obtain blood cultures and give antibiotics to cover for CAP including ceftriaxone and azithromycin. Will hold off on fluids at this time. Patient likely will benefit from thoracentesis with studies during her admission, though patient is not decompensated right now and has been breathing this way for 4 days, do not believe it requires emergent drainage.  Patient will be consulted to medicine for admission.   I have personally reviewed and interpreted all labs and imaging.    Dispo: Admitted to medicine           Final Clinical Impression(s) / ED Diagnoses Final diagnoses:  Pleural effusion on right  Shortness of breath  Community acquired pneumonia of right lung, unspecified part of lung  Hyponatremia    Rx / DC Orders ED Discharge Orders     None        This note was created using dictation software, which may  contain spelling or grammatical errors.    Audley Hose, MD 07/09/22 [phone]

## 2022-07-09 NOTE — Procedures (Signed)
Thoracentesis  Procedure Note  Gail Jennings  [id number]  Oct 03, 1932  Date:07/09/22  Time:6:36 PM   Provider Performing:Pete E Kary Kos   Procedure: Thoracentesis with imaging guidance (01601)  Indication(s) Pleural Effusion  Consent Unable to obtain consent due to emergent nature of procedure.  Anesthesia Topical only with 1% lidocaine    Time Out Verified patient identification, verified procedure, site/side was marked, verified correct patient position, special equipment/implants available, medications/allergies/relevant history reviewed, required imaging and test results available.   Sterile Technique Maximal sterile technique including full sterile barrier drape, hand hygiene, sterile gown, sterile gloves, mask, hair covering, sterile ultrasound probe cover (if used).  Procedure Description Ultrasound was used to identify appropriate pleural anatomy for placement and overlying skin marked.  Area of drainage cleaned and draped in sterile fashion. Lidocaine was used to anesthetize the skin and subcutaneous tissue.  1200 cc's of orange appearing fluid was drained from the right pleural space. Catheter then removed and bandaid applied to site.   Complications/Tolerance None; patient tolerated the procedure well. Chest X-ray is ordered to confirm no post-procedural complication.   EBL Minimal   Specimen(s) Pleural fluid     Erick Colace ACNP-BC [place] Pager # [phone] OR # [phone] if no answer

## 2022-07-09 NOTE — ED Provider Triage Note (Signed)
Emergency Medicine Provider Triage Evaluation Note  Gail Jennings , a 88 y.o. female  was evaluated in triage.  Pt complains of A-fib, RVR, shortness of breath, worse with exertion.  EMS picked up patient be in A-fib with rates into the 150s, she received 20 mg Cardizem 300 normal saline bolus.  She continues to endorse some shortness of breath, she denies significant chest pain.  She reports that she had some diarrhea over the last couple of days but denies any other significant sick symptoms.  She denies any nausea, vomiting, fever, chills..  Review of Systems  Positive: Shortness of breath, diarrhea Negative: Chest pain  Physical Exam  Ht 5\' 3"  (1.6 m)   Wt 49 kg   SpO2 98%   BMI 19.13 kg/m  Gen:   Awake, no distress   Resp:  Normal effort  MSK:   Moves extremities without difficulty  Other:  Irregularly irregular heart rhythm intermittently, rapid rate  Medical Decision Making  Medically screening exam initiated at 12:29 PM.  Appropriate orders placed.  [name] was informed that the remainder of the evaluation will be completed by another provider, this initial triage assessment does not replace that evaluation, and the importance of remaining in the ED until their evaluation is complete.  Workup initiated   Anselmo Pickler, [place] 07/09/22 1230

## 2022-07-09 NOTE — ED Notes (Signed)
Contacted lab to discuss if labs that are currently ordered could be added on. Advised they didn't have any tubes that they could be added on

## 2022-07-10 ENCOUNTER — Inpatient Hospital Stay (HOSPITAL_COMMUNITY): Payer: Medicare Other

## 2022-07-10 DIAGNOSIS — I4891 Unspecified atrial fibrillation: Secondary | ICD-10-CM | POA: Diagnosis not present

## 2022-07-10 DIAGNOSIS — R0603 Acute respiratory distress: Secondary | ICD-10-CM | POA: Diagnosis not present

## 2022-07-10 DIAGNOSIS — J90 Pleural effusion, not elsewhere classified: Secondary | ICD-10-CM | POA: Diagnosis not present

## 2022-07-10 LAB — ECHOCARDIOGRAM COMPLETE
Area-P 1/2: 3.65 cm2
Height: 63 in
S' Lateral: 2.3 cm
Weight: 1727.98 oz

## 2022-07-10 LAB — PROCALCITONIN
Procalcitonin: <0.1 ng/mL
Procalcitonin: <0.1 ng/mL

## 2022-07-10 MED ORDER — ENOXAPARIN SODIUM 40 MG/0.4ML IJ SOSY: 40.0000 mg | PREFILLED_SYRINGE | INTRAMUSCULAR | Status: DC

## 2022-07-10 MED ORDER — ENOXAPARIN SODIUM 30 MG/0.3ML IJ SOSY
30.0000 mg | PREFILLED_SYRINGE | INTRAMUSCULAR | Status: DC
  Administered 2022-07-10 – 2022-07-11 (×2): 30 mg via SUBCUTANEOUS
  Filled 2022-07-10 (×2): qty 0.3

## 2022-07-10 MED ORDER — FENTANYL CITRATE PF 50 MCG/ML IJ SOSY
PREFILLED_SYRINGE | INTRAMUSCULAR | Status: AC
  Administered 2022-07-10: 50 ug
  Filled 2022-07-10: qty 1

## 2022-07-10 MED ORDER — SODIUM CHLORIDE 0.9 % IV SOLN: INTRAVENOUS | Status: AC

## 2022-07-10 MED ORDER — SODIUM CHLORIDE 0.9% FLUSH
10.0000 mL | Freq: Three times a day (TID) | INTRAVENOUS | Status: DC
  Administered 2022-07-10 – 2022-07-13 (×9): 10 mL via INTRAPLEURAL

## 2022-07-10 MED ORDER — METOPROLOL TARTRATE 12.5 MG HALF TABLET
12.5000 mg | ORAL_TABLET | Freq: Two times a day (BID) | ORAL | Status: DC
  Administered 2022-07-10 – 2022-07-17 (×14): 12.5 mg via ORAL
  Filled 2022-07-10 (×14): qty 1

## 2022-07-10 MED ORDER — FENTANYL CITRATE PF 50 MCG/ML IJ SOSY: 50.0000 ug | PREFILLED_SYRINGE | Freq: Once | INTRAMUSCULAR | Status: AC

## 2022-07-10 MED ORDER — FENTANYL CITRATE PF 50 MCG/ML IJ SOSY
50.0000 ug | PREFILLED_SYRINGE | INTRAMUSCULAR | Status: DC | PRN
  Administered 2022-07-10 (×2): 50 ug via INTRAVENOUS
  Filled 2022-07-10 (×2): qty 1

## 2022-07-10 NOTE — ED Notes (Signed)
**Note Gail-Identified via Obfuscation** ED TO INPATIENT HANDOFF REPORT  ED Nurse Name and Phone #: Casey Burkitt [phone]  S Name/Age/Gender Gail Jennings 88 y.o. female Room/Bed: 031C/031C  Code Status   Code Status: Full Code  Home/SNF/Other Home Patient oriented to: self, place, time, and situation Is this baseline? Yes   Triage Complete: Triage complete  Chief Complaint Atrial fibrillation with RVR ([place]) [I48.91] Acute respiratory failure with hypoxia ([place]) [J96.01]  Triage Note Pt BIB [facility] from home c/o [facility] x2 days, worse with exertion. Per EMS pt was noted to be in a-fib with rates as high as 150. Pt has a 20g left forearm and received 20mg  of Cardizem and 300 NS.       Allergies Allergies  Allergen Reactions   Codeine Nausea And Vomiting   Hydrocodone Nausea And Vomiting   Succinylcholine Chloride     Prolonged paralysis    Level of Care/Admitting Diagnosis ED Disposition     ED Disposition  Admit   Condition  --   [place]: [place] [[id number]]  Level of Care: Progressive [102]  Admit to Progressive based on following criteria: RESPIRATORY PROBLEMS hypoxemic/hypercapnic respiratory failure that is responsive to NIPPV (BiPAP) or High Flow Nasal Cannula (6-80 lpm). Frequent assessment/intervention, no > Q2 hrs < Q4 hrs, to maintain oxygenation and pulmonary hygiene.  May admit patient to [facility] or [facility] if equivalent level of care is available:: No  Covid Evaluation: Symptomatic Person Under Investigation (PUI) or recent exposure (last 10 days) *Testing Required*  Diagnosis: Acute respiratory failure with hypoxia [facility]) [[id number]]  Admitting Physician: Karmen Bongo [[id number]]  Attending Physician: Karmen Bongo [[id number]]  Certification:: I certify this patient will need inpatient services for at least 2 midnights          B Medical/Surgery History Past Medical History:  Diagnosis Date   Atrial fibrillation ([place])    Complication of anesthesia     cannot take anectine   Hypertension    Inguinal hernia    Past Surgical History:  Procedure Laterality Date   ABDOMINAL HYSTERECTOMY     DENTAL SURGERY  Jan 11, 2011   bone graft and extraction   EYE SURGERY     detached retina, cryo, lens implant   INGUINAL HERNIA REPAIR  09/27/2011   Procedure: HERNIA REPAIR INGUINAL INCARCERATED;  Surgeon: Pedro Earls, MD;  Location: [place];  Service: General;  Laterality: Left;  Left Ingunial hernia repair with mesh   INGUINAL HERNIA REPAIR Left 10/19/2012   Procedure: LAPAROSCOPIC possible open left femoral hernia repair ;  Surgeon: Pedro Earls, MD;  Location: [facility];  Service: General;  Laterality: Left;  Laparoscopic Left Femoral Hernia Repair with Mesh   MOUTH SURGERY     lt upper bone graft for implants   TONSILLECTOMY     TUBAL LIGATION       A IV Location/Drains/Wounds Patient Lines/Drains/Airways Status     Active Line/Drains/Airways     Name Placement date Placement time Site Days   Peripheral IV 07/09/22 20 G Anterior;Left Forearm 07/09/22  1224  Forearm  1   Chest Tube 1 Right Pleural 07/10/22  1525  Pleural  less than 1   External Urinary Catheter 07/10/22  0000  --  less than 1   Incision - 2 Ports Other (Comment) Medial 2: Medial;Lower 10/19/12  1533  -- 3551            Intake/Output Last 24 hours  Intake/Output Summary (  Last 24 hours) at 07/10/2022 1624 Last data filed at 07/10/2022 1550 Gross per 24 hour  Intake 0.48 ml  Output 530 ml  Net -529.52 ml    Labs/Imaging Results for orders placed or performed during the hospital encounter of 07/09/22 (from the past 48 hour(s))  CBC     Status: Abnormal   Collection Time: 07/09/22 12:33 PM  Result Value Ref Range   WBC 12.5 (H) 4.0 - 10.5 K/uL   RBC 4.23 3.87 - 5.11 MIL/uL   Hemoglobin 14.5 12.0 - 15.0 g/dL   HCT 41.2 36.0 - 46.0 %   MCV 97.4 80.0 - 100.0 fL   MCH 34.3 (H) 26.0 - 34.0 pg   MCHC 35.2 30.0 - 36.0 g/dL   RDW 12.2 11.5 -  15.5 %   Platelets 416 (H) 150 - 400 K/uL   nRBC 0.0 0.0 - 0.2 %    Comment: Performed at [facility], [place] [address] [place], [place] [address]  Basic metabolic panel     Status: Abnormal   Collection Time: 07/09/22 12:33 PM  Result Value Ref Range   Sodium 128 (L) 135 - 145 mmol/L   Potassium 4.7 3.5 - 5.1 mmol/L   Chloride 95 (L) 98 - 111 mmol/L   CO2 24 22 - 32 mmol/L   Glucose, Bld 134 (H) 70 - 99 mg/dL    Comment: Glucose reference range applies only to samples taken after fasting for at least 8 hours.   BUN 17 8 - 23 mg/dL   Creatinine, Ser 0.85 0.44 - 1.00 mg/dL   Calcium 9.0 8.9 - 10.3 mg/dL   GFR, Estimated >60 >60 mL/min    Comment: (NOTE) Calculated using the CKD-EPI Creatinine Equation (2021)    Anion gap 9 5 - 15    Comment: Performed at [facility] [address], [place], [place] [address]  Troponin I (High Sensitivity)     Status: None   Collection Time: 07/09/22 12:33 PM  Result Value Ref Range   Troponin I (High Sensitivity) 11 <18 ng/L    Comment: (NOTE) Elevated high sensitivity troponin I (hsTnI) values and significant  changes across serial measurements may suggest ACS but many other  chronic and acute conditions are known to elevate hsTnI results.  Refer to the "Links" section for chest pain algorithms and additional  guidance. Performed at [facility], [place] [address] [place], [place] [address]   Resp Panel by RT-PCR (Flu A&B, Covid) Anterior Nasal Swab     Status: None   Collection Time: 07/09/22  4:41 PM   Specimen: Anterior Nasal Swab  Result Value Ref Range   SARS Coronavirus 2 by RT PCR NEGATIVE NEGATIVE    Comment: (NOTE) SARS-CoV-2 target nucleic acids are NOT DETECTED.  The SARS-CoV-2 RNA is generally detectable in upper respiratory specimens during the acute phase of infection. The lowest concentration of SARS-CoV-2 viral copies this assay can detect is 138 copies/mL. A negative result does not preclude  SARS-Cov-2 infection and should not be used as the sole basis for treatment or other patient management decisions. A negative result may occur with  improper specimen collection/handling, submission of specimen other than nasopharyngeal swab, presence of viral mutation(s) within the areas targeted by this assay, and inadequate number of viral copies(<138 copies/mL). A negative result must be combined with clinical observations, patient history, and epidemiological information. The expected result is Negative.  Fact Sheet for Patients:  EntrepreneurPulse.com.au  Fact Sheet for  Healthcare Providers:  IncredibleEmployment.be  This test is no t yet approved or cleared by the [place] and  has been authorized for detection and/or diagnosis of SARS-CoV-2 by FDA under an Emergency Use Authorization (EUA). This EUA will remain  in effect (meaning this test can be used) for the duration of the COVID-19 declaration under Section 564(b)(1) of the Act, 21 U.S.C.section 360bbb-3(b)(1), unless the authorization is terminated  or revoked sooner.       Influenza A by PCR NEGATIVE NEGATIVE   Influenza B by PCR NEGATIVE NEGATIVE    Comment: (NOTE) The Xpert Xpress SARS-CoV-2/FLU/RSV plus assay is intended as an aid in the diagnosis of influenza from Nasopharyngeal swab specimens and should not be used as a sole basis for treatment. Nasal washings and aspirates are unacceptable for Xpert Xpress SARS-CoV-2/FLU/RSV testing.  Fact Sheet for Patients: EntrepreneurPulse.com.au  Fact Sheet for Healthcare Providers: IncredibleEmployment.be  This test is not yet approved or cleared by the [place] FDA and has been authorized for detection and/or diagnosis of SARS-CoV-2 by FDA under an Emergency Use Authorization (EUA). This EUA will remain in effect (meaning this test can be used) for the duration of the COVID-19  declaration under Section 564(b)(1) of the Act, 21 U.S.C. section 360bbb-3(b)(1), unless the authorization is terminated or revoked.  Performed at [facility], [place] [address] [place], [place] [address]   Glucose, pleural or peritoneal fluid     Status: None   Collection Time: 07/09/22  5:24 PM  Result Value Ref Range   Glucose, Fluid 106 mg/dL    Comment: (NOTE) No normal range established for this test Results should be evaluated in conjunction with serum values    Fluid Type-FGLU PLEURAL     Comment: Performed at [facility] [address], [place], [place] [address] CORRECTED ON 11/04 AT 1853: PREVIOUSLY REPORTED AS Pleural R   Lactate dehydrogenase (pleural or peritoneal fluid)     Status: Abnormal   Collection Time: 07/09/22  5:24 PM  Result Value Ref Range   LD, Fluid 119 (H) 3 - 23 U/L    Comment: (NOTE) Results should be evaluated in conjunction with serum values    Fluid Type-FLDH PLEURAL     Comment: Performed at [facility], [place] [address], [place], [place] [address] CORRECTED ON 11/04 AT 1853: PREVIOUSLY REPORTED AS Pleural R   Body fluid culture w Gram Stain     Status: None (Preliminary result)   Collection Time: 07/09/22  5:24 PM   Specimen: Thoracentesis; Body Fluid  Result Value Ref Range   Specimen Description THORACENTESIS    Special Requests NONE    Gram Stain      RARE WBC PRESENT, PREDOMINANTLY PMN NO ORGANISMS SEEN    Culture      NO GROWTH < 24 HOURS Performed at [facility] [address], [place],  [address]    Report Status PENDING   Body fluid cell count with differential     Status: Abnormal   Collection Time: 07/09/22  5:24 PM  Result Value Ref Range   Fluid Type-FCT PLEURAL    Color, Fluid YELLOW YELLOW   Appearance, Fluid CLOUDY (A) CLEAR   Total Nucleated Cell Count, Fluid 840 0 - 1,000 cu mm    Comment: Clumping noted, count may be affected.   Neutrophil Count, Fluid 5 0 - 25 %   Lymphs,  Fluid 63 %   Monocyte-Macrophage-Serous Fluid 32 (L)  50 - 90 %   Eos, Fluid 0 %   Other Cells, Fluid OTHER CELLS IDENTIFIED AS MESOTHELIAL CELLS %    Comment: Performed at [facility], [place] [address] [place], [place] [address]  Blood culture (routine x 2)     Status: None (Preliminary result)   Collection Time: 07/09/22  8:25 PM   Specimen: BLOOD RIGHT FOREARM  Result Value Ref Range   Specimen Description BLOOD RIGHT FOREARM    Special Requests      BOTTLES DRAWN AEROBIC AND ANAEROBIC Blood Culture results may not be optimal due to an inadequate volume of blood received in culture bottles   Culture      NO GROWTH < 12 HOURS Performed at [facility], [place] [address] [place], [place] [address]    Report Status PENDING   Protein, total     Status: Abnormal   Collection Time: 07/09/22  8:25 PM  Result Value Ref Range   Total Protein 5.0 (L) 6.5 - 8.1 g/dL    Comment: Performed at [facility], [place] [address], [place], [place] [address]  Lactate dehydrogenase     Status: None   Collection Time: 07/09/22  8:25 PM  Result Value Ref Range   LDH 183 98 - 192 U/L    Comment: Performed at [facility], [place] [address], [place], [place] [address]  Procalcitonin - Baseline     Status: None   Collection Time: 07/09/22  8:25 PM  Result Value Ref Range   Procalcitonin <0.10 ng/mL    Comment:        Interpretation: PCT (Procalcitonin) <= 0.5 ng/mL: Systemic infection (sepsis) is not likely. Local bacterial infection is possible. (NOTE)       Sepsis PCT Algorithm           Lower Respiratory Tract                                      Infection PCT Algorithm    ----------------------------     ----------------------------         PCT < 0.25 ng/mL                PCT < 0.10 ng/mL          Strongly encourage             Strongly discourage   discontinuation of antibiotics    initiation of antibiotics    ----------------------------     -----------------------------        PCT 0.25 - 0.50 ng/mL            PCT 0.10 - 0.25 ng/mL               OR       >80% decrease in PCT            Discourage initiation of                                            antibiotics      Encourage discontinuation           of antibiotics    ----------------------------     -----------------------------         PCT >= 0.50 ng/mL  PCT 0.26 - 0.50 ng/mL               AND        <80% decrease in PCT             Encourage initiation of                                             antibiotics       Encourage continuation           of antibiotics    ----------------------------     -----------------------------        PCT >= 0.50 ng/mL                  PCT > 0.50 ng/mL               AND         increase in PCT                  Strongly encourage                                      initiation of antibiotics    Strongly encourage escalation           of antibiotics                                     -----------------------------                                           PCT <= 0.25 ng/mL                                                 OR                                        > 80% decrease in PCT                                      Discontinue / Do not initiate                                             antibiotics  Performed at [facility], [address] [place], [place] [address]   C-reactive protein     Status: None   Collection Time: 07/09/22  8:25 PM  Result Value Ref Range   CRP 0.7 <1.0 mg/dL    Comment: Performed at [facility] [address] [place], [place] [address]  Basic metabolic panel     Status: Abnormal   Collection Time: 07/09/22  8:25 PM  Result Value Ref  Range   Sodium 129 (L) 135 - 145 mmol/L   Potassium 5.0 3.5 - 5.1 mmol/L   Chloride 97 (L) 98 - 111 mmol/L   CO2 22 22 - 32 mmol/L   Glucose, Bld 106 (H) 70 - 99 mg/dL    Comment: Glucose reference range applies only to samples taken after fasting for at least 8  hours.   BUN 18 8 - 23 mg/dL   Creatinine, Ser 0.72 0.44 - 1.00 mg/dL   Calcium 8.6 (L) 8.9 - 10.3 mg/dL   GFR, Estimated >60 >60 mL/min    Comment: (NOTE) Calculated using the CKD-EPI Creatinine Equation (2021)    Anion gap 10 5 - 15    Comment: Performed at [facility] [address], [place], [place] [address]  Brain natriuretic peptide     Status: None   Collection Time: 07/09/22  8:25 PM  Result Value Ref Range   B Natriuretic Peptide 92.3 0.0 - 100.0 pg/mL    Comment: Performed at [facility] [address], [place], [place] [address]  Procalcitonin     Status: None   Collection Time: 07/10/22  5:04 AM  Result Value Ref Range   Procalcitonin <0.10 ng/mL    Comment:        Interpretation: PCT (Procalcitonin) <= 0.5 ng/mL: Systemic infection (sepsis) is not likely. Local bacterial infection is possible. (NOTE)       Sepsis PCT Algorithm           Lower Respiratory Tract                                      Infection PCT Algorithm    ----------------------------     ----------------------------         PCT < 0.25 ng/mL                PCT < 0.10 ng/mL          Strongly encourage             Strongly discourage   discontinuation of antibiotics    initiation of antibiotics    ----------------------------     -----------------------------       PCT 0.25 - 0.50 ng/mL            PCT 0.10 - 0.25 ng/mL               OR       >80% decrease in PCT            Discourage initiation of                                            antibiotics      Encourage discontinuation           of antibiotics    ----------------------------     -----------------------------         PCT >= 0.50 ng/mL              PCT 0.26 - 0.50 ng/mL               AND        <80% decrease in PCT             Encourage initiation of  antibiotics       Encourage continuation           of antibiotics    ----------------------------      -----------------------------        PCT >= 0.50 ng/mL                  PCT > 0.50 ng/mL               AND         increase in PCT                  Strongly encourage                                      initiation of antibiotics    Strongly encourage escalation           of antibiotics                                     -----------------------------                                           PCT <= 0.25 ng/mL                                                 OR                                        > 80% decrease in PCT                                      Discontinue / Do not initiate                                             antibiotics  Performed at [facility], [address] [place], [place] [address]    ECHOCARDIOGRAM COMPLETE  Result Date: 07/10/2022    ECHOCARDIOGRAM REPORT   Patient Name:   Gail Jennings Date of Exam: 07/10/2022 Medical Rec #:  [id number]          Height:       63.0 in Accession #:    [id number]         Weight:       108.0 lb Date of Birth:  12-22-1932         BSA:          1.488 m Patient Age:    67 years           BP:           110/64 mmHg Patient Gender: F                  HR:  80 bpm. Exam Location:  Inpatient Procedure: 2D Echo, Cardiac Doppler and Color Doppler Indications:    atrial fibrillation  History:        Patient has prior history of Echocardiogram examinations, most                 recent 08/28/2019. Risk Factors:Hypertension.  Sonographer:    Johny Chess RDCS Referring Phys: [place]  1. Left ventricular ejection fraction, by estimation, is 60 to 65%. The left ventricle has normal function. The left ventricle has no regional wall motion abnormalities. There is mild asymmetric left ventricular hypertrophy of the basal-septal segment. Left ventricular diastolic parameters are indeterminate.  2. Right ventricular systolic function is normal. The right ventricular size is normal. There is normal pulmonary  artery systolic pressure. The estimated right ventricular systolic pressure is 84.6 mmHg.  3. The mitral valve is normal in structure. Trivial mitral valve regurgitation. No evidence of mitral stenosis.  4. Tricuspid valve regurgitation is mild to moderate.  5. The aortic valve was not well visualized. Aortic valve regurgitation is not visualized. Aortic valve sclerosis/calcification is present, without any evidence of aortic stenosis.  6. The inferior vena cava is normal in size with greater than 50% respiratory variability, suggesting right atrial pressure of 3 mmHg. FINDINGS  Left Ventricle: Left ventricular ejection fraction, by estimation, is 60 to 65%. The left ventricle has normal function. The left ventricle has no regional wall motion abnormalities. The left ventricular internal cavity size was normal in size. There is  mild asymmetric left ventricular hypertrophy of the basal-septal segment. Left ventricular diastolic parameters are indeterminate. Right Ventricle: The right ventricular size is normal. No increase in right ventricular wall thickness. Right ventricular systolic function is normal. There is normal pulmonary artery systolic pressure. The tricuspid regurgitant velocity is 2.73 m/s, and  with an assumed right atrial pressure of 3 mmHg, the estimated right ventricular systolic pressure is 96.2 mmHg. Left Atrium: Left atrial size was normal in size. Right Atrium: Right atrial size was normal in size. Pericardium: There is no evidence of pericardial effusion. Mitral Valve: The mitral valve is normal in structure. Trivial mitral valve regurgitation. No evidence of mitral valve stenosis. Tricuspid Valve: The tricuspid valve is normal in structure. Tricuspid valve regurgitation is mild to moderate. Aortic Valve: The aortic valve was not well visualized. Aortic valve regurgitation is not visualized. Aortic valve sclerosis/calcification is present, without any evidence of aortic stenosis. Pulmonic  Valve: The pulmonic valve was not well visualized. Pulmonic valve regurgitation is not visualized. Aorta: The aortic root and ascending aorta are structurally normal, with no evidence of dilitation. Venous: The inferior vena cava is normal in size with greater than 50% respiratory variability, suggesting right atrial pressure of 3 mmHg. IAS/Shunts: The interatrial septum was not well visualized.  LEFT VENTRICLE PLAX 2D LVIDd:         3.90 cm   Diastology LVIDs:         2.30 cm   LV e' medial:    7.18 cm/s LV PW:         0.80 cm   LV E/e' medial:  10.9 LV IVS:        1.00 cm   LV e' lateral:   7.40 cm/s LVOT diam:     1.90 cm   LV E/e' lateral: 10.6 LV SV:         60 LV SV Index:   41 LVOT Area:     2.84 cm  RIGHT VENTRICLE             IVC RV S prime:     19.30 cm/s  IVC diam: 0.50 cm TAPSE (M-mode): 2.5 cm LEFT ATRIUM           Index        RIGHT ATRIUM           Index LA diam:      3.30 cm 2.22 cm/m   RA Area:     12.20 cm LA Vol (A2C): 25.4 ml 17.07 ml/m  RA Volume:   29.10 ml  19.55 ml/m LA Vol (A4C): 50.7 ml 34.07 ml/m  AORTIC VALVE LVOT Vmax:   113.00 cm/s LVOT Vmean:  75.100 cm/s LVOT VTI:    0.213 m  AORTA Ao Root diam: 3.20 cm Ao Asc diam:  3.40 cm MITRAL VALVE               TRICUSPID VALVE MV Area (PHT): 3.65 cm    TR Peak grad:   29.8 mmHg MV Decel Time: 208 msec    TR Vmax:        273.00 cm/s MV E velocity: 78.50 cm/s MV A velocity: 55.50 cm/s  SHUNTS MV E/A ratio:  1.41        Systemic VTI:  0.21 m                            Systemic Diam: 1.90 cm Oswaldo Milian MD Electronically signed by Oswaldo Milian MD Signature Date/Time: 07/10/2022/1:31:43 PM    Final    DG Chest Port 1 View  Result Date: 07/10/2022 CLINICAL DATA:  [id number] with right pleural effusion. EXAM: PORTABLE CHEST 1 VIEW COMPARISON:  Portable chest yesterday following right thoracentesis at 5:55 p.m., PA Lat yesterday before the thoracentesis at 1:57 p.m. FINDINGS: Moderate right pleural effusion continues to be seen.  The volume may have decreased in the interval since the last film or redistributed as there is partial reaeration of the right lower lung field in the interval. No pneumothorax is seen. The right upper and left lung fields are clear. There is mild cardiomegaly with normal caliber central vessels. The aorta is tortuous with patchy calcification. Stable mediastinum. Osteopenia. IMPRESSION: Moderate right pleural effusion continues to be seen. The volume may have decreased in the interval since the last film or redistributed as there is partial reaeration of the right lower lung field. In all other respects no further changes are seen.  No pneumothorax. Electronically Signed   By: Telford Nab M.D.   On: 07/10/2022 06:33   DG CHEST PORT 1 VIEW  Result Date: 07/09/2022 CLINICAL DATA:  Status post thoracentesis. EXAM: PORTABLE CHEST 1 VIEW COMPARISON:  Radiograph earlier today. FINDINGS: Slight decreased size of right pleural effusion post thoracentesis. No pneumothorax. Persistent opacification of the lower 2/3 of right hemithorax likely represents a combination of pleural fluid and airspace disease. The left lung appears clear. Heart size is obscured. IMPRESSION: 1. Slight decreased size of right pleural effusion post thoracentesis. No pneumothorax. 2. Persistent opacification of the lower 2/3 of right hemithorax likely represents a combination of residual pleural fluid and airspace disease. Electronically Signed   By: Keith Rake M.D.   On: 07/09/2022 18:07   DG Chest 2 View  Result Date: 07/09/2022 CLINICAL DATA:  Shortness of breath and wheezing. EXAM: CHEST - 2 VIEW COMPARISON:  08/16/2019 chest radiograph FINDINGS: A new large RIGHT pleural effusion is  noted with probable underlying consolidation or atelectasis. The LEFT lung is clear. The visualized portions of the cardiomediastinal silhouette are stable. There is no evidence of pneumothorax or acute bony abnormality. IMPRESSION: New large RIGHT  pleural effusion with probable underlying consolidation or atelectasis. Electronically Signed   By: Margarette Canada M.D.   On: 07/09/2022 14:27    Pending Labs Unresulted Labs (From admission, onward)     Start     Ordered   07/11/22 0500  CBC  Tomorrow morning,   R        07/10/22 1410   07/11/22 1962  Basic metabolic panel  Tomorrow morning,   R        07/10/22 1410   07/11/22 0500  Brain natriuretic peptide  Tomorrow morning,   R        07/10/22 1410   07/10/22 0500  Procalcitonin  Daily at 5am,   R      07/09/22 1726   07/09/22 1727  Cholesterol, body fluid  Once,   R        07/09/22 1726   07/09/22 1727  Cholesterol, total  Once,   R        07/09/22 1726   07/09/22 1724  Protein, body fluid (other)  Once,   R        07/09/22 1726   07/09/22 1635  Expectorated Sputum Assessment w Gram Stain, Rflx to Resp Cult  (COPD / Pneumonia / Cellulitis / Lower Extremity Wound)  Once,   R        07/09/22 1638   07/09/22 1532  Blood culture (routine x 2)  BLOOD CULTURE X 2,   R (with STAT occurrences)      07/09/22 1531            Vitals/Pain Today's Vitals   07/10/22 1000 07/10/22 1314 07/10/22 1400 07/10/22 1600  BP: 110/64  125/72 112/65  Pulse: 87  96 82  Resp: (!) 22  (!) 26 (!) 26  Temp:  98.2 F (36.8 C)    TempSrc:  Oral    SpO2: 97%  97% 96%  Weight:      Height:      PainSc:        Isolation Precautions No active isolations  Medications Medications  azithromycin (ZITHROMAX) 500 mg in sodium chloride 0.9 % 250 mL IVPB (0 mg Intravenous Stopped 07/09/22 1919)  albuterol (PROVENTIL) (2.5 MG/3ML) 0.083% nebulizer solution 2.5 mg (2.5 mg Nebulization Given 07/10/22 0134)  cefTRIAXone (ROCEPHIN) 1 g in sodium chloride 0.9 % 100 mL IVPB (1 g Intravenous New Bag/Given 07/10/22 1559)  acetaminophen (TYLENOL) tablet 650 mg (has no administration in time range)    Or  acetaminophen (TYLENOL) suppository 650 mg (has no administration in time range)  morphine (PF) 2 MG/ML  injection 2 mg (has no administration in time range)  docusate sodium (COLACE) capsule 100 mg (100 mg Oral Not Given 07/10/22 0947)  polyethylene glycol (MIRALAX / GLYCOLAX) packet 17 g (has no administration in time range)  bisacodyl (DULCOLAX) EC tablet 5 mg (has no administration in time range)  ondansetron (ZOFRAN) tablet 4 mg (has no administration in time range)    Or  ondansetron (ZOFRAN) injection 4 mg (has no administration in time range)  guaiFENesin (MUCINEX) 12 hr tablet 600 mg (has no administration in time range)  hydrALAZINE (APRESOLINE) injection 5 mg (has no administration in time range)  sodium chloride flush (NS) 0.9 % injection 3 mL (3 mLs Intravenous  Not Given 07/10/22 0921)  oxyCODONE (Oxy IR/ROXICODONE) immediate release tablet 5 mg (has no administration in time range)  metoprolol tartrate (LOPRESSOR) injection 5 mg (has no administration in time range)  0.9 %  sodium chloride infusion ( Intravenous Infusion Verify 07/10/22 0922)  metoprolol tartrate (LOPRESSOR) tablet 12.5 mg (0 mg Oral Hold 07/10/22 1504)  enoxaparin (LOVENOX) injection 30 mg (has no administration in time range)  fentaNYL (SUBLIMAZE) injection 50 mcg (50 mcg Intravenous Given 07/10/22 1557)  sodium chloride flush (NS) 0.9 % injection 10 mL (10 mLs Intrapleural Not Given 07/10/22 1610)  cefTRIAXone (ROCEPHIN) 1 g in sodium chloride 0.9 % 100 mL IVPB (0 g Intravenous Stopped 07/09/22 1646)  fentaNYL (SUBLIMAZE) injection 50 mcg (50 mcg Intravenous Given 07/10/22 1508)    Mobility non-ambulatory Low fall risk   Focused Assessments Pulmonary Assessment Handoff:  Lung sounds: Bilateral Breath Sounds: Diminished L Breath Sounds: Diminished R Breath Sounds: Diminished O2 Device: Nasal Cannula O2 Flow Rate (L/min): 2 L/min    R Recommendations: See Admitting Provider Note  Report given to:   Additional Notes:

## 2022-07-10 NOTE — Progress Notes (Signed)
  Echocardiogram 2D Echocardiogram has been performed.  Gail Jennings 07/10/2022, 11:20 AM

## 2022-07-10 NOTE — Procedures (Signed)
Insertion of Chest Tube Procedure Note  Gail Jennings  [id number]  [birth year]  Date:07/10/22  Time:4:05 PM    Provider Performing: Clementeen Graham   Procedure: Pleural Catheter Insertion w/o Imaging Guidance [phone])  Indication(s) Effusion  Consent Risks of the procedure as well as the alternatives and risks of each were explained to the patient and/or caregiver.  Consent for the procedure was obtained and is signed in the bedside chart  Anesthesia Topical only with 1% lidocaine    Time Out Verified patient identification, verified procedure, site/side was marked, verified correct patient position, special equipment/implants available, medications/allergies/relevant history reviewed, required imaging and test results available.   Sterile Technique Maximal sterile technique including full sterile barrier drape, hand hygiene, sterile gown, sterile gloves, mask, hair covering, sterile ultrasound probe cover (if used).   Procedure Description Ultrasound used to identify appropriate pleural anatomy for placement and overlying skin marked. Area of placement cleaned and draped in sterile fashion.  A 14 French pigtail pleural catheter was placed into the right pleural space using Seldinger technique. Appropriate return of fluid was obtained.  The tube was connected to atrium and placed on -20 cm H2O wall suction.   Complications/Tolerance None; patient tolerated the procedure well. Chest X-ray is ordered to verify placement.   EBL Minimal  Specimen(s) none Erick Colace ACNP-BC [place] Pager # [phone] OR # [phone] if no answer

## 2022-07-10 NOTE — Progress Notes (Signed)
PROGRESS NOTE    Gail Jennings  KPV:[id number] DOB: 05-01-1933 DOA: 07/09/2022 PCP: Burnard Bunting, MD   Chief Complaint  Patient presents with   Shortness of Breath    Brief Narrative:   Gail Jennings is a 88 y.o. female with medical history significant of HTN and atrial fibrillation presenting with SOB.  Work-up significant for right pleural effusion, hypoxia for which she is admitted to the hospital.  .    Assessment & Plan:   Principal Problem:   Respiratory distress Active Problems:   Atrial fibrillation with RVR (HCC)   Pleural effusion on right   Essential hypertension   Chronic hyponatremia  Acute hypoxic respiratory failure Respiratory distress Right pleural effusion -Still requiring 2 L nasal cannula, as she is 85 to 86% on room air -S/p thoracentesis 11/4 -Further management per PCCM reguarding imaging and intervention -Continue with empiric IV azithromycin and Rocephin coverage  -2D echo with a preserved EF, BNP within normal limit, no evidence of significant volume overload -Follow-up cytology to rule out malignancy -Effusion cultures pending, but no organisms seen on Gram stain.  Afib -Patient denies knowledge of afib and does not appear to have had f/u for this issue after it was diagnosed while she was hospitalized in 08/2019 -She is having PAF in the ER today -Consider anticoagulation once work-up is done and no further intervention is anticipated -continue with as needed metoprolol for heart rate control, will start on low-dose metoprolol  HTN -Hold olmesartan for now -Start on low-dose metoprolol   Hyponatremia -Appears to be chronic -?SIADH related to underlying lung issue -No apparent symptoms -Will follow      DVT prophylaxis:  Code Status:  Family Communication:  Disposition:   Status is: Inpatient    Consultants:  PCCM   Subjective:  She is still reporting dyspnea, fatigue Objective: Vitals:   07/10/22 0700  07/10/22 0914 07/10/22 1000 07/10/22 1314  BP: 132/77  110/64   Pulse: 74  87   Resp: 12  (!) 22   Temp:  (!) 97.5 F (36.4 C)  98.2 F (36.8 C)  TempSrc:  Oral  Oral  SpO2: 99%  97%   Weight:      Height:        Intake/Output Summary (Last 24 hours) at 07/10/2022 1346 Last data filed at 07/10/2022 0045 Gross per 24 hour  Intake 0.48 ml  Output --  Net 0.48 ml   Filed Weights   07/09/22 1228 07/09/22 2348  Weight: 49 kg 49 kg    Examination:  Awake Alert, Oriented X 3, frail, thin appearing Symmetrical Chest wall movement, decreased air entry in the right lung RRR,No Gallops,Rubs or new Murmurs, No Parasternal Heave +ve B.Sounds, Abd Soft, No tenderness, No rebound - guarding or rigidity. No Cyanosis, Clubbing or edema, No new Rash or bruise      Data Reviewed: I have personally reviewed following labs and imaging studies  CBC: Recent Labs  Lab 07/09/22 1233  WBC 12.5*  HGB 14.5  HCT 41.2  MCV 97.4  PLT 416*    Basic Metabolic Panel: Recent Labs  Lab 07/09/22 1233 07/09/22 2025  NA 128* 129*  K 4.7 5.0  CL 95* 97*  CO2 24 22  GLUCOSE 134* 106*  BUN 17 18  CREATININE 0.85 0.72  CALCIUM 9.0 8.6*    GFR: Estimated Creatinine Clearance: 39 mL/min (by C-G formula based on SCr of 0.72 mg/dL).  Liver Function Tests: Recent Labs  Lab 07/09/22 2025  PROT 5.0*    CBG: No results for input(s): "GLUCAP" in the last 168 hours.   Recent Results (from the past 240 hour(s))  Resp Panel by RT-PCR (Flu A&B, Covid) Anterior Nasal Swab     Status: None   Collection Time: 07/09/22  4:41 PM   Specimen: Anterior Nasal Swab  Result Value Ref Range Status   SARS Coronavirus 2 by RT PCR NEGATIVE NEGATIVE Final    Comment: (NOTE) SARS-CoV-2 target nucleic acids are NOT DETECTED.  The SARS-CoV-2 RNA is generally detectable in upper respiratory specimens during the acute phase of infection. The lowest concentration of SARS-CoV-2 viral copies this assay can  detect is 138 copies/mL. A negative result does not preclude SARS-Cov-2 infection and should not be used as the sole basis for treatment or other patient management decisions. A negative result may occur with  improper specimen collection/handling, submission of specimen other than nasopharyngeal swab, presence of viral mutation(s) within the areas targeted by this assay, and inadequate number of viral copies(<138 copies/mL). A negative result must be combined with clinical observations, patient history, and epidemiological information. The expected result is Negative.  Fact Sheet for Patients:  EntrepreneurPulse.com.au  Fact Sheet for Healthcare Providers:  IncredibleEmployment.be  This test is no t yet approved or cleared by the [place] FDA and  has been authorized for detection and/or diagnosis of SARS-CoV-2 by FDA under an Emergency Use Authorization (EUA). This EUA will remain  in effect (meaning this test can be used) for the duration of the COVID-19 declaration under Section 564(b)(1) of the Act, 21 U.S.C.section 360bbb-3(b)(1), unless the authorization is terminated  or revoked sooner.       Influenza A by PCR NEGATIVE NEGATIVE Final   Influenza B by PCR NEGATIVE NEGATIVE Final    Comment: (NOTE) The Xpert Xpress SARS-CoV-2/FLU/RSV plus assay is intended as an aid in the diagnosis of influenza from Nasopharyngeal swab specimens and should not be used as a sole basis for treatment. Nasal washings and aspirates are unacceptable for Xpert Xpress SARS-CoV-2/FLU/RSV testing.  Fact Sheet for Patients: EntrepreneurPulse.com.au  Fact Sheet for Healthcare Providers: IncredibleEmployment.be  This test is not yet approved or cleared by the [place] FDA and has been authorized for detection and/or diagnosis of SARS-CoV-2 by FDA under an Emergency Use Authorization (EUA). This EUA will remain in  effect (meaning this test can be used) for the duration of the COVID-19 declaration under Section 564(b)(1) of the Act, 21 U.S.C. section 360bbb-3(b)(1), unless the authorization is terminated or revoked.  Performed at [facility], [place] [address], [place], [place] [address]   Body fluid culture w Gram Stain     Status: None (Preliminary result)   Collection Time: 07/09/22  5:24 PM   Specimen: Thoracentesis; Body Fluid  Result Value Ref Range Status   Specimen Description THORACENTESIS  Final   Special Requests NONE  Final   Gram Stain   Final    RARE WBC PRESENT, PREDOMINANTLY PMN NO ORGANISMS SEEN    Culture   Final    NO GROWTH < 24 HOURS Performed at [facility] [address] [place],  [address]    Report Status PENDING  Incomplete  Blood culture (routine x 2)     Status: None (Preliminary result)   Collection Time: 07/09/22  8:25 PM   Specimen: BLOOD RIGHT FOREARM  Result Value Ref Range Status   Specimen Description BLOOD RIGHT FOREARM  Final   Special Requests  Final    BOTTLES DRAWN AEROBIC AND ANAEROBIC Blood Culture results may not be optimal due to an inadequate volume of blood received in culture bottles   Culture   Final    NO GROWTH < 12 HOURS Performed at [facility] [address], [place], [place] [address]    Report Status PENDING  Incomplete         Radiology Studies: ECHOCARDIOGRAM COMPLETE  Result Date: 07/10/2022    ECHOCARDIOGRAM REPORT   Patient Name:   TORRANCE FRECH Date of Exam: 07/10/2022 Medical Rec #:  [id number]          Height:       63.0 in Accession #:    [id number]         Weight:       108.0 lb Date of Birth:  1933/06/09         BSA:          1.488 m Patient Age:    7 years           BP:           110/64 mmHg Patient Gender: F                  HR:           80 bpm. Exam Location:  Inpatient Procedure: 2D Echo, Cardiac Doppler and Color Doppler Indications:    atrial fibrillation  History:         Patient has prior history of Echocardiogram examinations, most                 recent 08/28/2019. Risk Factors:Hypertension.  Sonographer:    Johny Chess RDCS Referring Phys: [place]  1. Left ventricular ejection fraction, by estimation, is 60 to 65%. The left ventricle has normal function. The left ventricle has no regional wall motion abnormalities. There is mild asymmetric left ventricular hypertrophy of the basal-septal segment. Left ventricular diastolic parameters are indeterminate.  2. Right ventricular systolic function is normal. The right ventricular size is normal. There is normal pulmonary artery systolic pressure. The estimated right ventricular systolic pressure is 00.9 mmHg.  3. The mitral valve is normal in structure. Trivial mitral valve regurgitation. No evidence of mitral stenosis.  4. Tricuspid valve regurgitation is mild to moderate.  5. The aortic valve was not well visualized. Aortic valve regurgitation is not visualized. Aortic valve sclerosis/calcification is present, without any evidence of aortic stenosis.  6. The inferior vena cava is normal in size with greater than 50% respiratory variability, suggesting right atrial pressure of 3 mmHg. FINDINGS  Left Ventricle: Left ventricular ejection fraction, by estimation, is 60 to 65%. The left ventricle has normal function. The left ventricle has no regional wall motion abnormalities. The left ventricular internal cavity size was normal in size. There is  mild asymmetric left ventricular hypertrophy of the basal-septal segment. Left ventricular diastolic parameters are indeterminate. Right Ventricle: The right ventricular size is normal. No increase in right ventricular wall thickness. Right ventricular systolic function is normal. There is normal pulmonary artery systolic pressure. The tricuspid regurgitant velocity is 2.73 m/s, and  with an assumed right atrial pressure of 3 mmHg, the estimated right ventricular  systolic pressure is 38.1 mmHg. Left Atrium: Left atrial size was normal in size. Right Atrium: Right atrial size was normal in size. Pericardium: There is no evidence of pericardial effusion. Mitral Valve: The mitral valve is normal in structure. Trivial mitral  valve regurgitation. No evidence of mitral valve stenosis. Tricuspid Valve: The tricuspid valve is normal in structure. Tricuspid valve regurgitation is mild to moderate. Aortic Valve: The aortic valve was not well visualized. Aortic valve regurgitation is not visualized. Aortic valve sclerosis/calcification is present, without any evidence of aortic stenosis. Pulmonic Valve: The pulmonic valve was not well visualized. Pulmonic valve regurgitation is not visualized. Aorta: The aortic root and ascending aorta are structurally normal, with no evidence of dilitation. Venous: The inferior vena cava is normal in size with greater than 50% respiratory variability, suggesting right atrial pressure of 3 mmHg. IAS/Shunts: The interatrial septum was not well visualized.  LEFT VENTRICLE PLAX 2D LVIDd:         3.90 cm   Diastology LVIDs:         2.30 cm   LV e' medial:    7.18 cm/s LV PW:         0.80 cm   LV E/e' medial:  10.9 LV IVS:        1.00 cm   LV e' lateral:   7.40 cm/s LVOT diam:     1.90 cm   LV E/e' lateral: 10.6 LV SV:         60 LV SV Index:   41 LVOT Area:     2.84 cm  RIGHT VENTRICLE             IVC RV S prime:     19.30 cm/s  IVC diam: 0.50 cm TAPSE (M-mode): 2.5 cm LEFT ATRIUM           Index        RIGHT ATRIUM           Index LA diam:      3.30 cm 2.22 cm/m   RA Area:     12.20 cm LA Vol (A2C): 25.4 ml 17.07 ml/m  RA Volume:   29.10 ml  19.55 ml/m LA Vol (A4C): 50.7 ml 34.07 ml/m  AORTIC VALVE LVOT Vmax:   113.00 cm/s LVOT Vmean:  75.100 cm/s LVOT VTI:    0.213 m  AORTA Ao Root diam: 3.20 cm Ao Asc diam:  3.40 cm MITRAL VALVE               TRICUSPID VALVE MV Area (PHT): 3.65 cm    TR Peak grad:   29.8 mmHg MV Decel Time: 208 msec    TR Vmax:         273.00 cm/s MV E velocity: 78.50 cm/s MV A velocity: 55.50 cm/s  SHUNTS MV E/A ratio:  1.41        Systemic VTI:  0.21 m                            Systemic Diam: 1.90 cm Oswaldo Milian MD Electronically signed by Oswaldo Milian MD Signature Date/Time: 07/10/2022/1:31:43 PM    Final    DG Chest Port 1 View  Result Date: 07/10/2022 CLINICAL DATA:  [id number] with right pleural effusion. EXAM: PORTABLE CHEST 1 VIEW COMPARISON:  Portable chest yesterday following right thoracentesis at 5:55 p.m., PA Lat yesterday before the thoracentesis at 1:57 p.m. FINDINGS: Moderate right pleural effusion continues to be seen. The volume may have decreased in the interval since the last film or redistributed as there is partial reaeration of the right lower lung field in the interval. No pneumothorax is seen. The right upper and left lung fields are clear. There  is mild cardiomegaly with normal caliber central vessels. The aorta is tortuous with patchy calcification. Stable mediastinum. Osteopenia. IMPRESSION: Moderate right pleural effusion continues to be seen. The volume may have decreased in the interval since the last film or redistributed as there is partial reaeration of the right lower lung field. In all other respects no further changes are seen.  No pneumothorax. Electronically Signed   By: Telford Nab M.D.   On: 07/10/2022 06:33   DG CHEST PORT 1 VIEW  Result Date: 07/09/2022 CLINICAL DATA:  Status post thoracentesis. EXAM: PORTABLE CHEST 1 VIEW COMPARISON:  Radiograph earlier today. FINDINGS: Slight decreased size of right pleural effusion post thoracentesis. No pneumothorax. Persistent opacification of the lower 2/3 of right hemithorax likely represents a combination of pleural fluid and airspace disease. The left lung appears clear. Heart size is obscured. IMPRESSION: 1. Slight decreased size of right pleural effusion post thoracentesis. No pneumothorax. 2. Persistent opacification of the lower  2/3 of right hemithorax likely represents a combination of residual pleural fluid and airspace disease. Electronically Signed   By: Keith Rake M.D.   On: 07/09/2022 18:07   DG Chest 2 View  Result Date: 07/09/2022 CLINICAL DATA:  Shortness of breath and wheezing. EXAM: CHEST - 2 VIEW COMPARISON:  08/16/2019 chest radiograph FINDINGS: A new large RIGHT pleural effusion is noted with probable underlying consolidation or atelectasis. The LEFT lung is clear. The visualized portions of the cardiomediastinal silhouette are stable. There is no evidence of pneumothorax or acute bony abnormality. IMPRESSION: New large RIGHT pleural effusion with probable underlying consolidation or atelectasis. Electronically Signed   By: Margarette Canada M.D.   On: 07/09/2022 14:27        Scheduled Meds:  docusate sodium  100 mg Oral BID   sodium chloride flush  3 mL Intravenous Q12H   Continuous Infusions:  sodium chloride 50 mL/hr at 07/10/22 9150   azithromycin Stopped (07/09/22 1919)   cefTRIAXone (ROCEPHIN)  IV       LOS: 1 day        Phillips Climes, MD [facility]   To contact the attending provider between 7A-7P or the covering provider during after hours 7P-7A, please log into the web site [email or web address] and access using universal [place] password for that web site. If you do not have the password, please call the hospital operator.  07/10/2022, 1:46 PM

## 2022-07-10 NOTE — ED Notes (Signed)
Per pt she needs to have a BM. Pt placed on bedpan at this time

## 2022-07-10 NOTE — Progress Notes (Addendum)
   NAME:  Gail Jennings, MRN:  [id number], DOB:  11/03/1932, LOS: 1 ADMISSION DATE:  07/09/2022, CONSULTATION DATE:  07/09/2022 REFERRING MD:  Lorin Mercy, EDP, CHIEF COMPLAINT:  Shortness of Breath    History of Present Illness:  88 year-old-female with significant past medical history per chart review of paroxysmal Atrial Fib (not on blood thinners) presents via EMS with complaints of worsening shortness of breath, cough w/ white sputum, x2 days following about a week of decreased activity tolerance. Her friend endorses hearing audible wheezing as well. She reports a recent history of nausea, diarrhea, and cough. EMS reported on their arrival she was in A. Fib in the 150s, and was given Cardizem 20mg  and 315mL NS bolus. On evaluation in the ED, she remains short of breath and is now in NSR.    CXR wl large right effusion PCCM asked to eval   Pertinent  Medical History  Paroxsymal A. Fib   Significant Hospital Events: Including procedures, antibiotic start and stop dates in addition to other pertinent events   Right thora 1200 ml out. Sent for analysis 11/4-->exudate 11/5 right chest tube placed   Interim History / Subjective:  Feels a little better   Objective   Blood pressure 125/72, pulse 96, temperature 98.2 F (36.8 C), temperature source Oral, resp. rate (Abnormal) 26, height 5\' 3"  (1.6 m), weight 49 kg, SpO2 97 %.        Intake/Output Summary (Last 24 hours) at 07/10/2022 1550 Last data filed at 07/10/2022 0045 Gross per 24 hour  Intake 0.48 ml  Output no documentation  Net 0.48 ml   Filed Weights   07/09/22 1228 07/09/22 2348  Weight: 49 kg 49 kg    Examination:  General 88 year old female resting in bed. No distress HENT NCAT no JVD  Pulm blood tinged pleural fluid. Good tidal. Had transient airleak. Now resolved. Scattered rhonchi to exam  Chest: right lateral chest wall and post back w/ palpable swelling.  Card RRR Abd soft Ext warm and dry  Neuro intact    Resolved Hospital Problem list     Assessment & Plan:  Acute hypoxic respiratory failure 2/2  large right pleural effusion. Paroxsymal Atrial Fib. Fluid and electrolyte imbalance.   Right pleural effusion. Exudative by lights. Now s/p tube thoracostomy 11/5  Plan CT to sxn Am cxr  Await pleural cytology and culture data Cont abx  Small right pleurocutaneous fistula.  Fluid was draining into [place] tissue post-thora. Has sig dependent tissue swelling. Should be self limiting especially as has a ct in place now Plan Monitor. Expect fluid to re-absorb  AF Plan Rate control If no issues over night OK to start [place] (right click and "Reselect all SmartList Selections" daily)   Per primary   Erick Colace ACNP-BC [place] Pager # [phone] OR # [phone] if no answer

## 2022-07-10 NOTE — ED Notes (Signed)
Patient to vascular.

## 2022-07-10 NOTE — ED Notes (Signed)
Pt removed from bedpan at this time. Pt only urinated.

## 2022-07-10 NOTE — ED Notes (Signed)
Radiology at bedside at this time.

## 2022-07-11 ENCOUNTER — Inpatient Hospital Stay (HOSPITAL_COMMUNITY): Payer: Medicare Other

## 2022-07-11 DIAGNOSIS — E871 Hypo-osmolality and hyponatremia: Secondary | ICD-10-CM | POA: Diagnosis not present

## 2022-07-11 DIAGNOSIS — J90 Pleural effusion, not elsewhere classified: Secondary | ICD-10-CM | POA: Diagnosis not present

## 2022-07-11 DIAGNOSIS — I4891 Unspecified atrial fibrillation: Secondary | ICD-10-CM | POA: Diagnosis not present

## 2022-07-11 LAB — CBC
HCT: 40 % (ref 36.0–46.0)
Hemoglobin: 13.4 g/dL (ref 12.0–15.0)
MCH: 33.1 pg (ref 26.0–34.0)
MCHC: 33.5 g/dL (ref 30.0–36.0)
MCV: 98.8 fL (ref 80.0–100.0)
Platelets: 365 10*3/uL (ref 150–400)
RBC: 4.05 MIL/uL (ref 3.87–5.11)
RDW: 12.5 % (ref 11.5–15.5)
WBC: 12.3 10*3/uL — ABNORMAL HIGH (ref 4.0–10.5)
nRBC: 0 % (ref 0.0–0.2)

## 2022-07-11 LAB — BASIC METABOLIC PANEL
Anion gap: 10 (ref 5–15)
BUN: 17 mg/dL (ref 8–23)
CO2: 23 mmol/L (ref 22–32)
Calcium: 8.6 mg/dL — ABNORMAL LOW (ref 8.9–10.3)
Chloride: 99 mmol/L (ref 98–111)
Creatinine, Ser: 0.59 mg/dL (ref 0.44–1.00)
GFR, Estimated: >60 mL/min (ref >60)
Glucose, Bld: 115 mg/dL — ABNORMAL HIGH (ref 70–99)
Potassium: 4.7 mmol/L (ref 3.5–5.1)
Sodium: 132 mmol/L — ABNORMAL LOW (ref 135–145)

## 2022-07-11 LAB — PROTEIN, BODY FLUID (OTHER): Total Protein, Body Fluid Other: 3.9 g/dL

## 2022-07-11 LAB — BRAIN NATRIURETIC PEPTIDE: B Natriuretic Peptide: 66.5 pg/mL (ref 0.0–100.0)

## 2022-07-11 LAB — PROCALCITONIN: Procalcitonin: <0.1 ng/mL

## 2022-07-11 NOTE — Progress Notes (Signed)
PROGRESS NOTE    JHOSELIN CRUME  GXQ:[id number] DOB: 02-Sep-1933 DOA: 07/09/2022 PCP: Burnard Bunting, MD   Chief Complaint  Patient presents with   Shortness of Breath    Brief Narrative:   Gail Jennings is a 88 y.o. female with medical history significant of HTN and atrial fibrillation presenting with SOB.  Work-up significant for right pleural effusion, hypoxia for which she is admitted to the hospital.  .    Assessment & Plan:   Principal Problem:   Respiratory distress Active Problems:   Atrial fibrillation with RVR (HCC)   Pleural effusion on right   Essential hypertension   Chronic hyponatremia  Acute hypoxic respiratory failure Respiratory distress Right pleural effusion -Still requiring 2 L nasal cannula, as she is 85 to 86% on room air -S/p thoracentesis 11/4 -Further management per PCCM reguarding imaging and intervention -Continue with empiric IV azithromycin and Rocephin coverage  -2D echo with a preserved EF, BNP within normal limit, no evidence of significant volume overload -Follow-up cytology to rule out malignancy still pending -, Status post thoracostomy 11/5, repeat x-ray in a.m., if drainage less than 150 cc can DC tube per PCCM.  Small right pleurocutaneous fistula -Would be self-limiting especially as has chest tube and, with expectation of fluid to reabsorb.  Afib -Patient denies knowledge of afib and does not appear to have had f/u for this issue after it was diagnosed while she was hospitalized in 08/2019 -She is having PAF in the ER today -Consider anticoagulation once work-up is done and no further intervention is anticipated hopefully can start tomorrow if no issues overnight per PCCM -continue with as needed metoprolol for heart rate control, will start on low-dose metoprolol  HTN -Hold olmesartan for now -Start on low-dose metoprolol   Hyponatremia -Appears to be chronic -?SIADH related to underlying lung issue -No  apparent symptoms -Will follow      DVT prophylaxis: Subcu Lovenox Code Status: Full code Family Communication: None at bedside Disposition:   Status is: Inpatient    Consultants:  PCCM   Subjective:  No significant events overnight, complains of pain at chest tube insertion site Objective: Vitals:   07/10/22 2340 07/11/22 0421 07/11/22 0728 07/11/22 0904  BP: 122/63 119/62 117/67 117/67  Pulse: (!) 59 68 70 70  Resp: 11 11 12    Temp:  97.9 F (36.6 C) 98 F (36.7 C)   TempSrc:  Oral Oral   SpO2: 97% 98% 98%   Weight:      Height:   5\' 3"  (1.6 m)     Intake/Output Summary (Last 24 hours) at 07/11/2022 1425 Last data filed at 07/11/2022 0500 Gross per 24 hour  Intake --  Output 600 ml  Net -600 ml   Filed Weights   07/09/22 1228 07/09/22 2348  Weight: 49 kg 49 kg    Examination:  Awake Alert, Oriented X 3, frail, thin appearing Symmetrical Chest wall movement, diminished air entry at the right lung, chest tube present, with subcutaneous fluid collection, with mild improvement. RRR,No Gallops,Rubs or new Murmurs, No Parasternal Heave +ve B.Sounds, Abd Soft, No tenderness, No rebound - guarding or rigidity. No Cyanosis, Clubbing or edema, No new Rash or bruise       Data Reviewed: I have personally reviewed following labs and imaging studies  CBC: Recent Labs  Lab 07/09/22 1233 07/11/22 0216  WBC 12.5* 12.3*  HGB 14.5 13.4  HCT 41.2 40.0  MCV 97.4 98.8  PLT 416* 365  Basic Metabolic Panel: Recent Labs  Lab 07/09/22 1233 07/09/22 2025 07/11/22 0216  NA 128* 129* 132*  K 4.7 5.0 4.7  CL 95* 97* 99  CO2 24 22 23   GLUCOSE 134* 106* 115*  BUN 17 18 17   CREATININE 0.85 0.72 0.59  CALCIUM 9.0 8.6* 8.6*    GFR: Estimated Creatinine Clearance: 39 mL/min (by C-G formula based on SCr of 0.59 mg/dL).  Liver Function Tests: Recent Labs  Lab 07/09/22 2025  PROT 5.0*    CBG: No results for input(s): "GLUCAP" in the last 168  hours.   Recent Results (from the past 240 hour(s))  Resp Panel by RT-PCR (Flu A&B, Covid) Anterior Nasal Swab     Status: None   Collection Time: 07/09/22  4:41 PM   Specimen: Anterior Nasal Swab  Result Value Ref Range Status   SARS Coronavirus 2 by RT PCR NEGATIVE NEGATIVE Final    Comment: (NOTE) SARS-CoV-2 target nucleic acids are NOT DETECTED.  The SARS-CoV-2 RNA is generally detectable in upper respiratory specimens during the acute phase of infection. The lowest concentration of SARS-CoV-2 viral copies this assay can detect is 138 copies/mL. A negative result does not preclude SARS-Cov-2 infection and should not be used as the sole basis for treatment or other patient management decisions. A negative result may occur with  improper specimen collection/handling, submission of specimen other than nasopharyngeal swab, presence of viral mutation(s) within the areas targeted by this assay, and inadequate number of viral copies(<138 copies/mL). A negative result must be combined with clinical observations, patient history, and epidemiological information. The expected result is Negative.  Fact Sheet for Patients:  EntrepreneurPulse.com.au  Fact Sheet for Healthcare Providers:  IncredibleEmployment.be  This test is no t yet approved or cleared by the [place] FDA and  has been authorized for detection and/or diagnosis of SARS-CoV-2 by FDA under an Emergency Use Authorization (EUA). This EUA will remain  in effect (meaning this test can be used) for the duration of the COVID-19 declaration under Section 564(b)(1) of the Act, 21 U.S.C.section 360bbb-3(b)(1), unless the authorization is terminated  or revoked sooner.       Influenza A by PCR NEGATIVE NEGATIVE Final   Influenza B by PCR NEGATIVE NEGATIVE Final    Comment: (NOTE) The Xpert Xpress SARS-CoV-2/FLU/RSV plus assay is intended as an aid in the diagnosis of influenza from  Nasopharyngeal swab specimens and should not be used as a sole basis for treatment. Nasal washings and aspirates are unacceptable for Xpert Xpress SARS-CoV-2/FLU/RSV testing.  Fact Sheet for Patients: EntrepreneurPulse.com.au  Fact Sheet for Healthcare Providers: IncredibleEmployment.be  This test is not yet approved or cleared by the [place] FDA and has been authorized for detection and/or diagnosis of SARS-CoV-2 by FDA under an Emergency Use Authorization (EUA). This EUA will remain in effect (meaning this test can be used) for the duration of the COVID-19 declaration under Section 564(b)(1) of the Act, 21 U.S.C. section 360bbb-3(b)(1), unless the authorization is terminated or revoked.  Performed at [facility], [place] [address], [place], [place] [address]   Body fluid culture w Gram Stain     Status: None (Preliminary result)   Collection Time: 07/09/22  5:24 PM   Specimen: Thoracentesis; Body Fluid  Result Value Ref Range Status   Specimen Description THORACENTESIS  Final   Special Requests NONE  Final   Gram Stain   Final    RARE WBC PRESENT, PREDOMINANTLY PMN NO ORGANISMS SEEN  Culture   Final    NO GROWTH 2 DAYS Performed at [facility], [place] [address] [place], [place] [address]    Report Status PENDING  Incomplete  Blood culture (routine x 2)     Status: None (Preliminary result)   Collection Time: 07/09/22  8:25 PM   Specimen: BLOOD RIGHT FOREARM  Result Value Ref Range Status   Specimen Description BLOOD RIGHT FOREARM  Final   Special Requests   Final    BOTTLES DRAWN AEROBIC AND ANAEROBIC Blood Culture results may not be optimal due to an inadequate volume of blood received in culture bottles   Culture   Final    NO GROWTH 2 DAYS Performed at [facility], [place] [address], [place], [place] [address]    Report Status PENDING  Incomplete         Radiology Studies: DG Chest Port 1  View  Result Date: 07/11/2022 CLINICAL DATA:  Follow-up chest 2 EXAM: PORTABLE CHEST 1 VIEW COMPARISON:  07/10/2022 FINDINGS: Right chest tube remains in place. Small amount of pleural air at the apex persists, less than 5%. Some atelectasis persists in the right lower lung. Chronic aortic atherosclerosis. No new finding. IMPRESSION: No significant change. Right chest tube remains in place. Small amount of pleural air at the apex persists, less than 5%. Electronically Signed   By: Nelson Chimes M.D.   On: 07/11/2022 08:18   DG Chest Port 1 View  Result Date: 07/10/2022 CLINICAL DATA:  Right pleural effusion, placement of right chest tube EXAM: PORTABLE CHEST 1 VIEW COMPARISON:  Previous studies including the examination done earlier today FINDINGS: Transverse diameter of heart is slightly increased. Thoracic aorta is tortuous and ectatic. There are no signs of pulmonary edema. There is improvement in aeration of right mid and right lower lung fields after placement of right chest tube suggesting significant interval decrease in right pleural effusion. There is small right apical pneumothorax measuring 5 mm in diameter. Right lung remains well expanded. Residual increased markings are seen in right lower lung field. IMPRESSION: There is marked interval decrease in right pleural effusion after placement of right chest tube. Small right apical pneumothorax is seen. Electronically Signed   By: Elmer Picker M.D.   On: 07/10/2022 17:25   ECHOCARDIOGRAM COMPLETE  Result Date: 07/10/2022    ECHOCARDIOGRAM REPORT   Patient Name:   SHATERRIA SAGER Date of Exam: 07/10/2022 Medical Rec #:  [id number]          Height:       63.0 in Accession #:    [id number]         Weight:       108.0 lb Date of Birth:  October 31, 1932         BSA:          1.488 m Patient Age:    88 years           BP:           110/64 mmHg Patient Gender: F                  HR:           80 bpm. Exam Location:  Inpatient Procedure: 2D Echo,  Cardiac Doppler and Color Doppler Indications:    atrial fibrillation  History:        Patient has prior history of Echocardiogram examinations, most  recent 08/28/2019. Risk Factors:Hypertension.  Sonographer:    Johny Chess RDCS Referring Phys: [place]  1. Left ventricular ejection fraction, by estimation, is 60 to 65%. The left ventricle has normal function. The left ventricle has no regional wall motion abnormalities. There is mild asymmetric left ventricular hypertrophy of the basal-septal segment. Left ventricular diastolic parameters are indeterminate.  2. Right ventricular systolic function is normal. The right ventricular size is normal. There is normal pulmonary artery systolic pressure. The estimated right ventricular systolic pressure is 53.6 mmHg.  3. The mitral valve is normal in structure. Trivial mitral valve regurgitation. No evidence of mitral stenosis.  4. Tricuspid valve regurgitation is mild to moderate.  5. The aortic valve was not well visualized. Aortic valve regurgitation is not visualized. Aortic valve sclerosis/calcification is present, without any evidence of aortic stenosis.  6. The inferior vena cava is normal in size with greater than 50% respiratory variability, suggesting right atrial pressure of 3 mmHg. FINDINGS  Left Ventricle: Left ventricular ejection fraction, by estimation, is 60 to 65%. The left ventricle has normal function. The left ventricle has no regional wall motion abnormalities. The left ventricular internal cavity size was normal in size. There is  mild asymmetric left ventricular hypertrophy of the basal-septal segment. Left ventricular diastolic parameters are indeterminate. Right Ventricle: The right ventricular size is normal. No increase in right ventricular wall thickness. Right ventricular systolic function is normal. There is normal pulmonary artery systolic pressure. The tricuspid regurgitant velocity is 2.73 m/s,  and  with an assumed right atrial pressure of 3 mmHg, the estimated right ventricular systolic pressure is 64.4 mmHg. Left Atrium: Left atrial size was normal in size. Right Atrium: Right atrial size was normal in size. Pericardium: There is no evidence of pericardial effusion. Mitral Valve: The mitral valve is normal in structure. Trivial mitral valve regurgitation. No evidence of mitral valve stenosis. Tricuspid Valve: The tricuspid valve is normal in structure. Tricuspid valve regurgitation is mild to moderate. Aortic Valve: The aortic valve was not well visualized. Aortic valve regurgitation is not visualized. Aortic valve sclerosis/calcification is present, without any evidence of aortic stenosis. Pulmonic Valve: The pulmonic valve was not well visualized. Pulmonic valve regurgitation is not visualized. Aorta: The aortic root and ascending aorta are structurally normal, with no evidence of dilitation. Venous: The inferior vena cava is normal in size with greater than 50% respiratory variability, suggesting right atrial pressure of 3 mmHg. IAS/Shunts: The interatrial septum was not well visualized.  LEFT VENTRICLE PLAX 2D LVIDd:         3.90 cm   Diastology LVIDs:         2.30 cm   LV e' medial:    7.18 cm/s LV PW:         0.80 cm   LV E/e' medial:  10.9 LV IVS:        1.00 cm   LV e' lateral:   7.40 cm/s LVOT diam:     1.90 cm   LV E/e' lateral: 10.6 LV SV:         60 LV SV Index:   41 LVOT Area:     2.84 cm  RIGHT VENTRICLE             IVC RV S prime:     19.30 cm/s  IVC diam: 0.50 cm TAPSE (M-mode): 2.5 cm LEFT ATRIUM           Index  RIGHT ATRIUM           Index LA diam:      3.30 cm 2.22 cm/m   RA Area:     12.20 cm LA Vol (A2C): 25.4 ml 17.07 ml/m  RA Volume:   29.10 ml  19.55 ml/m LA Vol (A4C): 50.7 ml 34.07 ml/m  AORTIC VALVE LVOT Vmax:   113.00 cm/s LVOT Vmean:  75.100 cm/s LVOT VTI:    0.213 m  AORTA Ao Root diam: 3.20 cm Ao Asc diam:  3.40 cm MITRAL VALVE               TRICUSPID VALVE MV  Area (PHT): 3.65 cm    TR Peak grad:   29.8 mmHg MV Decel Time: 208 msec    TR Vmax:        273.00 cm/s MV E velocity: 78.50 cm/s MV A velocity: 55.50 cm/s  SHUNTS MV E/A ratio:  1.41        Systemic VTI:  0.21 m                            Systemic Diam: 1.90 cm Oswaldo Milian MD Electronically signed by Oswaldo Milian MD Signature Date/Time: 07/10/2022/1:31:43 PM    Final    DG Chest Port 1 View  Result Date: 07/10/2022 CLINICAL DATA:  [id number] with right pleural effusion. EXAM: PORTABLE CHEST 1 VIEW COMPARISON:  Portable chest yesterday following right thoracentesis at 5:55 p.m., PA Lat yesterday before the thoracentesis at 1:57 p.m. FINDINGS: Moderate right pleural effusion continues to be seen. The volume may have decreased in the interval since the last film or redistributed as there is partial reaeration of the right lower lung field in the interval. No pneumothorax is seen. The right upper and left lung fields are clear. There is mild cardiomegaly with normal caliber central vessels. The aorta is tortuous with patchy calcification. Stable mediastinum. Osteopenia. IMPRESSION: Moderate right pleural effusion continues to be seen. The volume may have decreased in the interval since the last film or redistributed as there is partial reaeration of the right lower lung field. In all other respects no further changes are seen.  No pneumothorax. Electronically Signed   By: Telford Nab M.D.   On: 07/10/2022 06:33   DG CHEST PORT 1 VIEW  Result Date: 07/09/2022 CLINICAL DATA:  Status post thoracentesis. EXAM: PORTABLE CHEST 1 VIEW COMPARISON:  Radiograph earlier today. FINDINGS: Slight decreased size of right pleural effusion post thoracentesis. No pneumothorax. Persistent opacification of the lower 2/3 of right hemithorax likely represents a combination of pleural fluid and airspace disease. The left lung appears clear. Heart size is obscured. IMPRESSION: 1. Slight decreased size of right pleural  effusion post thoracentesis. No pneumothorax. 2. Persistent opacification of the lower 2/3 of right hemithorax likely represents a combination of residual pleural fluid and airspace disease. Electronically Signed   By: Keith Rake M.D.   On: 07/09/2022 18:07        Scheduled Meds:  docusate sodium  100 mg Oral BID   enoxaparin (LOVENOX) injection  30 mg Subcutaneous Q24H   metoprolol tartrate  12.5 mg Oral BID   sodium chloride flush  10 mL Intrapleural Q8H   sodium chloride flush  3 mL Intravenous Q12H   Continuous Infusions:  azithromycin Stopped (07/10/22 1756)   cefTRIAXone (ROCEPHIN)  IV Stopped (07/10/22 1629)     LOS: 2 days  Phillips Climes, MD [facility]   To contact the attending provider between 7A-7P or the covering provider during after hours 7P-7A, please log into the web site [email or web address] and access using universal [place] password for that web site. If you do not have the password, please call the hospital operator.  07/11/2022, 2:25 PM

## 2022-07-11 NOTE — Progress Notes (Signed)
Mobility Specialist - Progress Note   07/11/22 1004  Mobility  Activity Ambulated with assistance in room  Level of Assistance Contact guard assist, steadying assist  Assistive Device Front wheel walker  Distance Ambulated (ft) 20 ft  Activity Response Tolerated well  Mobility Referral Yes  $Mobility charge 1 Mobility   Pt received in bed and agreeable. Session limited d/t pt being on wall suction. Pt had no complaints throughout. Pt left in bed with all needs met and bed alarm on.   Larey Seat

## 2022-07-11 NOTE — Evaluation (Signed)
Occupational Therapy Evaluation Patient Details Name: Gail Jennings MRN: [id number] DOB: [birth year] Today's Date: 07/11/2022   History of Present Illness 88 y.o. female presenting to ED 11/04 with SOB.   4 days of SOB, wheezing, respiratory distress, cough, diarrhea.  She can only breathe lying on her Right side. Imaging revealed large R lung pleural effusion and pt found to be in afib rate as high as 150s.  s/p 11/4 thoracentesis. 11/5 R chest tube placed PMH:HTN and atrial fibrillation   Clinical Impression   Pt in recliner upon therapy arrival and agreeable to participate in OT evaluation. Patient is currently experiencing slightly decreased activity tolerance due to pain sustained from chest tube placement. Pt is normally very active at baseline reporting that she goes to the gym almost every day. I do not recommend any follow up OT needs at this time.       Recommendations for follow up therapy are one component of a multi-disciplinary discharge planning process, led by the attending physician.  Recommendations may be updated based on patient status, additional functional criteria and insurance authorization.   Follow Up Recommendations  No OT follow up    Assistance Recommended at Discharge None     Functional Status Assessment  Patient has had a recent decline in their functional status and demonstrates the ability to make significant improvements in function in a reasonable and predictable amount of time.  Equipment Recommendations  None recommended by OT       Precautions / Restrictions Precautions Precautions: Fall Precaution Comments: chest tube hooked to suction Restrictions Weight Bearing Restrictions: No      Mobility Bed Mobility Overal bed mobility:  (Up in recliner upon arrival)     Patient Response: Cooperative  Transfers Overall transfer level: Needs assistance   Transfers: Sit to/from Stand, Bed to chair/wheelchair/BSC Sit to Stand: Supervision      Step pivot transfers: Min guard            Balance Overall balance assessment: Needs assistance Sitting-balance support: Feet supported, No upper extremity supported Sitting balance-Leahy Scale: Good     Standing balance support: During functional activity, No upper extremity supported, Single extremity supported Standing balance-Leahy Scale: Fair       ADL either performed or assessed with clinical judgement   ADL Overall ADL's : Modified independent;At baseline         Vision Baseline Vision/History: 1 Wears glasses (reading glasses) Ability to See in Adequate Light: 0 Adequate Patient Visual Report: No change from baseline Vision Assessment?: No apparent visual deficits     Perception Perception Perception: Within Functional Limits   Praxis Praxis Praxis: Intact    Pertinent Vitals/Pain Pain Assessment Pain Assessment: 0-10 Pain Score: 3  Pain Location: chest tube site with movement Pain Descriptors / Indicators: Grimacing, Guarding Pain Intervention(s): Limited activity within patient's tolerance, Monitored during session     Hand Dominance Left   Extremity/Trunk Assessment Upper Extremity Assessment Upper Extremity Assessment: Defer to OT evaluation   Lower Extremity Assessment Lower Extremity Assessment: Overall WFL for tasks assessed   Cervical / Trunk Assessment Cervical / Trunk Assessment: Normal   Communication Communication Communication: No difficulties   Cognition Arousal/Alertness: Awake/alert   Overall Cognitive Status: Impaired/Different from baseline Area of Impairment: Following commands, Safety/judgement, Problem solving     Following Commands: Follows multi-step commands with increased time Safety/Judgement: Decreased awareness of safety, Decreased awareness of deficits   Problem Solving: Requires verbal cues       General Comments  VSS            Home Living Family/patient expects to be discharged to:: Private  residence Living Arrangements: Alone Available Help at Discharge: Neighbor;Available PRN/intermittently Type of Home: House Home Access: Stairs to enter [employer] of Steps: 2 Entrance Stairs-Rails: Right;Left Home Layout: Two level Alternate Level Stairs-Number of Steps: 15 Alternate Level Stairs-Rails: Right Bathroom Shower/Tub: Occupational psychologist: Standard Bathroom Accessibility: Yes   Home Equipment: Grab bars - tub/shower;Hand held shower head;None          Prior Functioning/Environment Prior Level of Function : Independent/Modified Independent             Mobility Comments: goes to gym for weights and treadmill daily ADLs Comments: independent, driving        OT Problem List: Decreased activity tolerance      OT Treatment/Interventions:      OT Goals(Current goals can be found in the care plan section) Acute Rehab OT Goals Patient Stated Goal: to go home and shower  OT Frequency:  1 time visit       AM-PAC OT "6 Clicks" Daily Activity     Outcome Measure Help from another person eating meals?: None Help from another person taking care of personal grooming?: None Help from another person toileting, which includes using toliet, bedpan, or urinal?: None Help from another person bathing (including washing, rinsing, drying)?: A Little Help from another person to put on and taking off regular upper body clothing?: None Help from another person to put on and taking off regular lower body clothing?: A Little 6 Click Score: 22   End of Session Equipment Utilized During Treatment: Gait belt  Activity Tolerance: Patient tolerated treatment well Patient left: in chair;with call bell/phone within reach  OT Visit Diagnosis: Muscle weakness (generalized) (M62.81)                Time: 2993-7169 OT Time Calculation (min): 18 min Charges:  OT General Charges $OT Visit: 1 Visit OT Evaluation $OT Eval Low Complexity: 1 Low  [employer], OTR/L,CBIS  Supplemental OT - [facility] and [facility]   Arcenio Mullaly, Gail Jennings 07/11/2022, 1:51 PM

## 2022-07-11 NOTE — Progress Notes (Signed)
Chaplain and pt attend same church. Chaplain visited and made connection with pt discussing mutual friends. Chaplain offered ministry of presence as pt spoke of her situation, her friends and family members.    [place]

## 2022-07-11 NOTE — Progress Notes (Signed)
   NAME:  Gail Jennings, MRN:  [id number], DOB:  [birth year], LOS: 2 ADMISSION DATE:  07/09/2022, CONSULTATION DATE:  07/09/2022 REFERRING MD:  Lorin Mercy, EDP, CHIEF COMPLAINT:  Shortness of Breath    History of Present Illness:  88 year-old-female with significant past medical history per chart review of paroxysmal Atrial Fib (not on blood thinners) presents via EMS with complaints of worsening shortness of breath, cough w/ white sputum, x2 days following about a week of decreased activity tolerance. Her friend endorses hearing audible wheezing as well. She reports a recent history of nausea, diarrhea, and cough. EMS reported on their arrival she was in A. Fib in the 150s, and was given Cardizem 20mg  and 339mL NS bolus. On evaluation in the ED, she remains short of breath and is now in NSR.    CXR wl large right effusion PCCM asked to eval   Pertinent  Medical History  Paroxsymal A. Fib   Significant Hospital Events: Including procedures, antibiotic start and stop dates in addition to other pertinent events   Right thora 1200 ml out. Sent for analysis 11/4-->exudate 11/5 right chest tube placed   Interim History / Subjective:   Pain is much improved Right chest tube drain 600 cc, 70 cc in last 12 hours Afebrile, on 2 L nasal cannula  Objective   Blood pressure 117/67, pulse 70, temperature 98 F (36.7 C), temperature source Oral, resp. rate 12, height 5\' 3"  (1.6 m), weight 49 kg, SpO2 98 %.        Intake/Output Summary (Last 24 hours) at 07/11/2022 1309 Last data filed at 07/11/2022 0500 Gross per 24 hour  Intake --  Output 1800 ml  Net -1800 ml    Filed Weights   07/09/22 1228 07/09/22 2348  Weight: 49 kg 49 kg    Examination:  General elderly woman sitting in a chair, no distress HENT NCAT no JVD  Pulm decreased breath sounds right base, no accessory muscle use, no air leak on Pleur-evac Card S1-S2 regular Abd soft Ext warm and dry  Neuro intact   hest x-ray shows  improved aeration on right, 5% apical pneumothorax Labs show mild hyponatremia, negative procalcitonin, mild leukocytosis  Resolved Hospital Problem list     Assessment & Plan:  Acute hypoxic respiratory failure 2/2  large right pleural effusion. Lymphocytic exudate.  Now s/p tube thoracostomy 11/5  Plan CT to sxn Repeat chest x-ray in a.m., if drainage less than 150 cc, can discontinue Await pleural cytology and culture data Cont abx  Small right pleurocutaneous fistula.  Fluid was draining into [place] tissue post-thora. Has sig dependent tissue swelling. Should be self limiting especially as has a ct in place now Plan Monitor. Expect fluid to re-absorb  AF Plan Rate control If no issues over night OK to start [facility]   Kara Mead MD. FCCP. [facility] Pager : [phone]  If no response to pager , please call [phone] until 7 pm After 7:00 pm call Elink  [phone]   07/11/2022

## 2022-07-11 NOTE — Evaluation (Signed)
Physical Therapy Evaluation Patient Details Name: Gail Jennings MRN: [id number] DOB: [birth year] Today's Date: 07/11/2022  History of Present Illness  87 y.o. female presenting to ED 11/04 with SOB.   4 days of SOB, wheezing, respiratory distress, cough, diarrhea.  She can only breathe lying on her Right side. Imaging revealed large R lung pleural effusion and pt found to be in afib rate as high as 150s.  s/p 11/4 thoracentesis. 11/5 R chest tube placed PMH:HTN and atrial fibrillation  Clinical Impression  PTA pt living alone in 2 story home with 2 steps to enter and 15 steps to bed and full bath. Pt reports independence at home going to the gym 5 days a week and driving. Pt limited in mobility today by poor safety awareness with chest tube and poor command follow, in presence of decreased strength and balance. Pt is mod I for bed mobility, supervision with sit<>stan and min A for steadying with stepping to recliner. PT recommending HHPT at discharge. PT will continue to follow acutely.     Recommendations for follow up therapy are one component of a multi-disciplinary discharge planning process, led by the attending physician.  Recommendations may be updated based on patient status, additional functional criteria and insurance authorization.  Follow Up Recommendations Home health PT      Assistance Recommended at Discharge Frequent or constant Supervision/Assistance  Patient can return home with the following  A little help with walking and/or transfers;A little help with bathing/dressing/bathroom;Assistance with cooking/housework;Direct supervision/assist for medications management;Assist for transportation;Help with stairs or ramp for entrance    Equipment Recommendations Rolling walker (2 wheels)     Functional Status Assessment Patient has had a recent decline in their functional status and demonstrates the ability to make significant improvements in function in a reasonable and  predictable amount of time.     Precautions / Restrictions Precautions Precautions: Fall Precaution Comments: chest tube hooked to suction Restrictions Weight Bearing Restrictions: No      Mobility  Bed Mobility Overal bed mobility: Modified Independent             General bed mobility comments: use of bed rail to pull over to EoB    Transfers Overall transfer level: Needs assistance   Transfers: Sit to/from Stand, Bed to chair/wheelchair/BSC Sit to Stand: Supervision   Step pivot transfers: Min assist       General transfer comment: pt able to stand from bed with supervision, however despite cuing for turning towards chest tube pt turns away from chest tube and in the process gets tangled in tele cords    Ambulation/Gait               General Gait Details: unable due to chest tube being connected to wall suction      Balance Overall balance assessment: Needs assistance Sitting-balance support: Feet supported, No upper extremity supported Sitting balance-Leahy Scale: Good     Standing balance support: During functional activity, No upper extremity supported, Single extremity supported Standing balance-Leahy Scale: Fair Standing balance comment: able to static stand but becomes more unsteady with marching in place, reaching back to recliner arm to self steady                             Pertinent Vitals/Pain Pain Assessment Pain Assessment: Faces Pain Score: 2  Pain Location: chest tube site with movement Pain Descriptors / Indicators: Grimacing, Guarding Pain Intervention(s): Limited activity within patient's  tolerance, Repositioned    Home Living Family/patient expects to be discharged to:: Private residence Living Arrangements: Alone Available Help at Discharge: Neighbor;Available PRN/intermittently Type of Home: House Home Access: Stairs to enter Entrance Stairs-Rails: Psychiatric nurse of Steps: 2 Alternate  Level Stairs-Number of Steps: 15 Home Layout: Two level Home Equipment: Grab bars - tub/shower;Hand held shower head;None      Prior Function Prior Level of Function : Independent/Modified Independent             Mobility Comments: goes to gym for weights and treadmill daily ADLs Comments: independent, driving     Hand Dominance   Dominant Hand: Left    Extremity/Trunk Assessment   Upper Extremity Assessment Upper Extremity Assessment: Defer to OT evaluation    Lower Extremity Assessment Lower Extremity Assessment: Overall WFL for tasks assessed    Cervical / Trunk Assessment Cervical / Trunk Assessment: Normal  Communication   Communication: No difficulties  Cognition Arousal/Alertness: Awake/alert Behavior During Therapy: Impulsive (requires increased cuing to slow down with lines and leads) Overall Cognitive Status: Impaired/Different from baseline Area of Impairment: Following commands, Safety/judgement, Problem solving                       Following Commands: Follows multi-step commands with increased time Safety/Judgement: Decreased awareness of deficits, Decreased awareness of safety   Problem Solving: Requires verbal cues, Requires tactile cues General Comments: pt somewhat impulsive with movement, despite cuing for chest tube and other lines, turns away from chest tube getting tangled up        General Comments General comments (skin integrity, edema, etc.): Pt on 2L O2 via [place] SpO2 >92% O2 throughout, R chest tube hooked to wall suction, BP at rest 108/71    Exercises General Exercises - Lower Extremity Hip Flexion/Marching: AROM, Both, 10 reps, Standing (instability with static marching)   Assessment/Plan    PT Assessment Patient needs continued PT services  PT Problem List Decreased activity tolerance;Decreased balance;Decreased mobility;Decreased cognition;Cardiopulmonary status limiting activity;Decreased safety awareness       PT  Treatment Interventions DME instruction;Gait training;Stair training;Functional mobility training;Therapeutic activities;Therapeutic exercise;Balance training;Cognitive remediation;Patient/family education    PT Goals (Current goals can be found in the Care Plan section)  Acute Rehab PT Goals Patient Stated Goal: go home PT Goal Formulation: With patient Time For Goal Achievement: 07/25/22 Potential to Achieve Goals: Good    Frequency Min 3X/week        AM-PAC PT "6 Clicks" Mobility  Outcome Measure Help needed turning from your back to your side while in a flat bed without using bedrails?: None Help needed moving from lying on your back to sitting on the side of a flat bed without using bedrails?: None Help needed moving to and from a bed to a chair (including a wheelchair)?: A Little Help needed standing up from a chair using your arms (e.g., wheelchair or bedside chair)?: A Little Help needed to walk in hospital room?: A Little Help needed climbing 3-5 steps with a railing? : A Lot 6 Click Score: 19    End of Session Equipment Utilized During Treatment: Oxygen Activity Tolerance: Patient tolerated treatment well Patient left: in chair;with call bell/phone within reach;with chair alarm set Nurse Communication: Mobility status PT Visit Diagnosis: Unsteadiness on feet (R26.81);Difficulty in walking, not elsewhere classified (R26.2)    Time: 7342-8768 PT Time Calculation (min) (ACUTE ONLY): 18 min   Charges:   PT Evaluation $PT Eval Moderate Complexity: 1 Mod  Ladana Chavero B. Migdalia Dk PT, DPT Acute Rehabilitation Services Please use secure chat or  Call Office [phone]   [place] 07/11/2022, 11:05 AM

## 2022-07-11 NOTE — TOC Initial Note (Signed)
Transition of Care [facility]) - Initial/Assessment Note    Patient Details  Name: Gail Jennings MRN: [id number] Date of Birth: 01-26-1933  [facility] CM/SW Contact:    Bethena Roys, RN Phone Number: 07/11/2022, 2:38 PM  Clinical Narrative: Patient presented for shortness of breath. PTA patient states she was from home alone in a two story home. Patient sates she has redone her bathroom and gets around the home without issues. PT recommendations are for home with home health services along with DME RW. Case Manager spoke with patient and she feels like she will not need therapy when she returns home. Medicare.gov list provided to the patient and Case Manager will continue to follow to see how she progresses. Patients goal is to return to the gym. Patient states she was driving and gets medications without any issues. Case Manager will continue to follow for disposition needs.               Expected Discharge Plan: [place] Barriers to Discharge: Continued Medical Work up   Patient Goals and CMS Choice Patient states their goals for this hospitalization and ongoing recovery are:: plan to transition home and get back to the gym.   Choice offered to / list presented to : NA  Expected Discharge Plan and Services Expected Discharge Plan: [place] In-house Referral: NA Discharge Planning Services: CM Consult   Living arrangements for the past 2 months: Single Family Home   Prior Living Arrangements/Services Living arrangements for the past 2 months: Single Family Home Lives with:: Self Patient language and need for interpreter reviewed:: Yes        Need for Family Participation in Patient Care: No (Comment)     Criminal Activity/Legal Involvement Pertinent to Current Situation/Hospitalization: No - Comment as needed  Emotional Assessment Appearance:: Appears stated age Attitude/Demeanor/Rapport: Engaged Affect  (typically observed): Appropriate Orientation: : Oriented to Self, Oriented to Place, Oriented to  Time, Oriented to Situation Alcohol / Substance Use: Not Applicable Psych Involvement: No (comment)  Admission diagnosis:  Shortness of breath [R06.02] Hyponatremia [E87.1] Pleural effusion on right [J90] Acute respiratory failure with hypoxia (HCC) [J96.01] Atrial fibrillation with RVR ([place]) [I48.91] Community acquired pneumonia of right lung, unspecified part of lung [J18.9] Patient Active Problem List   Diagnosis Date Noted   Atrial fibrillation with RVR ([place]) 07/09/2022   Respiratory distress 07/09/2022   Pleural effusion on right 07/09/2022   Essential hypertension 07/09/2022   Chronic hyponatremia 07/09/2022   Paroxysmal A-fib ([place])    History of left indirect inguinal hernia and lap repair left femoral hernia 11/22/2012   PCP:  Burnard Bunting, MD Pharmacy:   [facility] [address] [place] - [address] [place] [address] Phone: [phone] Fax: [phone]  [id number] - [place], [place] - [address] AT [place] [address] [place] [address] Phone: [phone] Fax: [phone]  [facility] [place], [place] [address] [place] [address] [place] [address] Phone: [phone] Fax: [phone]  Readmission Risk Interventions     No data to display

## 2022-07-12 ENCOUNTER — Inpatient Hospital Stay (HOSPITAL_COMMUNITY): Payer: Medicare Other

## 2022-07-12 DIAGNOSIS — J90 Pleural effusion, not elsewhere classified: Secondary | ICD-10-CM | POA: Diagnosis not present

## 2022-07-12 LAB — BASIC METABOLIC PANEL
Anion gap: 7 (ref 5–15)
BUN: 19 mg/dL (ref 8–23)
CO2: 22 mmol/L (ref 22–32)
Calcium: 8 mg/dL — ABNORMAL LOW (ref 8.9–10.3)
Chloride: 99 mmol/L (ref 98–111)
Creatinine, Ser: 0.58 mg/dL (ref 0.44–1.00)
GFR, Estimated: >60 mL/min (ref >60)
Glucose, Bld: 105 mg/dL — ABNORMAL HIGH (ref 70–99)
Potassium: 3.9 mmol/L (ref 3.5–5.1)
Sodium: 128 mmol/L — ABNORMAL LOW (ref 135–145)

## 2022-07-12 LAB — CBC
HCT: 33.3 % — ABNORMAL LOW (ref 36.0–46.0)
Hemoglobin: 11.7 g/dL — ABNORMAL LOW (ref 12.0–15.0)
MCH: 33.2 pg (ref 26.0–34.0)
MCHC: 35.1 g/dL (ref 30.0–36.0)
MCV: 94.6 fL (ref 80.0–100.0)
Platelets: 331 10*3/uL (ref 150–400)
RBC: 3.52 MIL/uL — ABNORMAL LOW (ref 3.87–5.11)
RDW: 12.3 % (ref 11.5–15.5)
WBC: 10.3 10*3/uL (ref 4.0–10.5)
nRBC: 0 % (ref 0.0–0.2)

## 2022-07-12 MED ORDER — APIXABAN 2.5 MG PO TABS: 2.5000 mg | ORAL_TABLET | Freq: Two times a day (BID) | ORAL | Status: DC

## 2022-07-12 MED ORDER — APIXABAN 2.5 MG PO TABS
2.5000 mg | ORAL_TABLET | Freq: Two times a day (BID) | ORAL | Status: DC
  Administered 2022-07-12 – 2022-07-13 (×2): 2.5 mg via ORAL
  Filled 2022-07-12 (×2): qty 1

## 2022-07-12 NOTE — Progress Notes (Signed)
   NAME:  REENE HARLACHER, MRN:  [id number], DOB:  [birth year], LOS: 3 ADMISSION DATE:  07/09/2022, CONSULTATION DATE:  07/09/2022 REFERRING MD:  Lorin Mercy, EDP, CHIEF COMPLAINT:  Shortness of Breath    History of Present Illness:  88 year-old-female with significant past medical history per chart review of paroxysmal Atrial Fib (not on blood thinners) presents via EMS with complaints of worsening shortness of breath, cough w/ white sputum, x2 days following about a week of decreased activity tolerance. Her friend endorses hearing audible wheezing as well. She reports a recent history of nausea, diarrhea, and cough. EMS reported on their arrival she was in A. Fib in the 150s, and was given Cardizem 20mg  and 310mL NS bolus. On evaluation in the ED, she remains short of breath and is now in NSR.    CXR wl large right effusion PCCM asked to eval   Pertinent  Medical History  Paroxsymal A. Fib   Significant Hospital Events: Including procedures, antibiotic start and stop dates in addition to other pertinent events   Right thora 1200 ml out. Sent for analysis 11/4-->exudate 11/5 right chest tube placed   Interim History / Subjective:   800 cc of drainage from right chest tube Objective   Blood pressure (!) 139/54, pulse 81, temperature 98 F (36.7 C), temperature source Oral, resp. rate 16, height 5\' 3"  (1.6 m), weight 49 kg, SpO2 99 %.        Intake/Output Summary (Last 24 hours) at 07/12/2022 0911 Last data filed at 07/12/2022 0641 Gross per 24 hour  Intake 1097.58 ml  Output 800 ml  Net 297.58 ml   Filed Weights   07/09/22 1228 07/09/22 2348  Weight: 49 kg 49 kg    Examination:  General: Frail elderly female HEENT: MM pink/moist no JVD the Neuro: Grossly intact CV: Heart sounds are distant PULM: Diminished in right base right chest tube without air leak with 800 cc of drainage in 24 hours Currently on oxygen without a documented O2 sats, O2 sats check 99% on 2 L nasal  cannula GI: soft, bsx4 active  GU: Voids Extremities: warm/dry, negative edema  Skin: no rashes or lesions, right fistula chest with dressing unremarkable   Resolved Hospital Problem list     Assessment & Plan:  Acute hypoxic respiratory failure 2/2  large right pleural effusion. Lymphocytic exudate.  Now s/p tube thoracostomy 11/5  07/12/2022 800 cc of drainage Plan  Chest tubes remain to suction Serial chest x-rays Monitor cytology and culture data Currently on antibiotics    Small right pleurocutaneous fistula.  Fluid was draining into [place] tissue post-thora. Has sig dependent tissue swelling. Should be self limiting especially as has a ct in place now Plan Continue to monitor  AF Plan Per primary Can resume anticoagulation   Richardson Landry Legacy Carrender ACNP Acute Care Nurse Practitioner [place] Please consult Amion 07/12/2022, 9:11 AM

## 2022-07-12 NOTE — Progress Notes (Signed)
PROGRESS NOTE    Gail Jennings  IRW:[id number] DOB: 28-May-1933 DOA: 07/09/2022 PCP: Burnard Bunting, MD   Chief Complaint  Patient presents with   Shortness of Breath    Brief Narrative:   Gail Jennings is a 88 y.o. female with medical history significant of HTN and atrial fibrillation presenting with SOB.  Work-up significant for right pleural effusion, hypoxia for which she is admitted for further work-up, she initially had thoracentesis, with persistent fluid, so chest tube was inserted by [facility] 11/5.    Assessment & Plan:   Principal Problem:   Respiratory distress Active Problems:   Atrial fibrillation with RVR (HCC)   Pleural effusion on right   Essential hypertension   Chronic hyponatremia  Acute hypoxic respiratory failure Respiratory distress Right pleural effusion -Still requiring 2 L nasal cannula, as she is 85 to 86% on room air -S/p thoracentesis 11/4 -Further management per PCCM reguarding imaging and intervention -Continue with empiric IV azithromycin and Rocephin coverage  -2D echo with a preserved EF, BNP within normal limit, no evidence of significant volume overload -Cytology no evidence of malignancy - Status post thoracostomy 11/5, Magement per PCCM, no need for thrombolytics,.  Small right pleurocutaneous fistula -Would be self-limiting especially as has chest tube and, with expectation of fluid to reabsorb.  Afib -Patient denies knowledge of afib and does not appear to have had f/u for this issue after it was diagnosed while she was hospitalized in 08/2019 -She is having PAF in the ER today -Cleared by PCCM to start anticoagulation, will start on full dose Lovenox, hopefully can be transitioned to Eliquis prior to discharge.   -started on low-dose metoprolol  HTN -Hold olmesartan for now -Start on low-dose metoprolol   Hyponatremia -Appears to be chronic -?SIADH related to underlying lung issue -No apparent symptoms -Will  follow      DVT prophylaxis: Subcu Lovenox Code Status: Full code Family Communication: called  her sister per patient request, she was updated by phone. Disposition:   Status is: Inpatient    Consultants:  PCCM   Subjective:  No significant events overnight, complains of pain at chest tube insertion site Objective: Vitals:   07/11/22 2052 07/12/22 0629 07/12/22 0819 07/12/22 1553  BP:  97/64 (!) 139/54 120/75  Pulse:  74 81 73  Resp: 18 16  17   Temp: 98.1 F (36.7 C) 98 F (36.7 C)  97.9 F (36.6 C)  TempSrc: Oral Oral  Oral  SpO2: 99% 99%  97%  Weight:      Height:        Intake/Output Summary (Last 24 hours) at 07/12/2022 1711 Last data filed at 07/12/2022 0641 Gross per 24 hour  Intake 1096.58 ml  Output 800 ml  Net 296.58 ml   Filed Weights   07/09/22 1228 07/09/22 2348  Weight: 49 kg 49 kg    Examination:  Awake Alert, Oriented X 3, thin appearing, frail Symmetrical Chest wall movement, moved air entry in the right lung, chest tube present, fluid collection inferior to chest tube has resolved RRR,No Gallops,Rubs or new Murmurs, No Parasternal Heave +ve B.Sounds, Abd Soft, No tenderness, No rebound - guarding or rigidity. No Cyanosis, Clubbing or edema, No new Rash or bruise       Data Reviewed: I have personally reviewed following labs and imaging studies  CBC: Recent Labs  Lab 07/09/22 1233 07/11/22 0216 07/12/22 0229  WBC 12.5* 12.3* 10.3  HGB 14.5 13.4 11.7*  HCT 41.2 40.0 33.3*  MCV 97.4 98.8 94.6  PLT 416* 365 254    Basic Metabolic Panel: Recent Labs  Lab 07/09/22 1233 07/09/22 2025 07/11/22 0216 07/12/22 0229  NA 128* 129* 132* 128*  K 4.7 5.0 4.7 3.9  CL 95* 97* 99 99  CO2 24 22 23 22   GLUCOSE 134* 106* 115* 105*  BUN 17 18 17 19   CREATININE 0.85 0.72 0.59 0.58  CALCIUM 9.0 8.6* 8.6* 8.0*    GFR: Estimated Creatinine Clearance: 39 mL/min (by C-G formula based on SCr of 0.58 mg/dL).  Liver Function Tests: Recent  Labs  Lab 07/09/22 2025  PROT 5.0*    CBG: No results for input(s): "GLUCAP" in the last 168 hours.   Recent Results (from the past 240 hour(s))  Resp Panel by RT-PCR (Flu A&B, Covid) Anterior Nasal Swab     Status: None   Collection Time: 07/09/22  4:41 PM   Specimen: Anterior Nasal Swab  Result Value Ref Range Status   SARS Coronavirus 2 by RT PCR NEGATIVE NEGATIVE Final    Comment: (NOTE) SARS-CoV-2 target nucleic acids are NOT DETECTED.  The SARS-CoV-2 RNA is generally detectable in upper respiratory specimens during the acute phase of infection. The lowest concentration of SARS-CoV-2 viral copies this assay can detect is 138 copies/mL. A negative result does not preclude SARS-Cov-2 infection and should not be used as the sole basis for treatment or other patient management decisions. A negative result may occur with  improper specimen collection/handling, submission of specimen other than nasopharyngeal swab, presence of viral mutation(s) within the areas targeted by this assay, and inadequate number of viral copies(<138 copies/mL). A negative result must be combined with clinical observations, patient history, and epidemiological information. The expected result is Negative.  Fact Sheet for Patients:  EntrepreneurPulse.com.au  Fact Sheet for Healthcare Providers:  IncredibleEmployment.be  This test is no t yet approved or cleared by the [place] FDA and  has been authorized for detection and/or diagnosis of SARS-CoV-2 by FDA under an Emergency Use Authorization (EUA). This EUA will remain  in effect (meaning this test can be used) for the duration of the COVID-19 declaration under Section 564(b)(1) of the Act, 21 U.S.C.section 360bbb-3(b)(1), unless the authorization is terminated  or revoked sooner.       Influenza A by PCR NEGATIVE NEGATIVE Final   Influenza B by PCR NEGATIVE NEGATIVE Final    Comment: (NOTE) The Xpert  Xpress SARS-CoV-2/FLU/RSV plus assay is intended as an aid in the diagnosis of influenza from Nasopharyngeal swab specimens and should not be used as a sole basis for treatment. Nasal washings and aspirates are unacceptable for Xpert Xpress SARS-CoV-2/FLU/RSV testing.  Fact Sheet for Patients: EntrepreneurPulse.com.au  Fact Sheet for Healthcare Providers: IncredibleEmployment.be  This test is not yet approved or cleared by the [place] FDA and has been authorized for detection and/or diagnosis of SARS-CoV-2 by FDA under an Emergency Use Authorization (EUA). This EUA will remain in effect (meaning this test can be used) for the duration of the COVID-19 declaration under Section 564(b)(1) of the Act, 21 U.S.C. section 360bbb-3(b)(1), unless the authorization is terminated or revoked.  Performed at [facility], [place] [address] [place], [place] [address]   Body fluid culture w Gram Stain     Status: None (Preliminary result)   Collection Time: 07/09/22  5:24 PM   Specimen: Thoracentesis; Body Fluid  Result Value Ref Range Status   Specimen Description THORACENTESIS  Final   Special Requests NONE  Final   Gram Stain   Final    RARE WBC PRESENT, PREDOMINANTLY PMN NO ORGANISMS SEEN    Culture   Final    NO GROWTH 3 DAYS Performed at [facility] [address], [place], [place] [address]    Report Status PENDING  Incomplete  Blood culture (routine x 2)     Status: None (Preliminary result)   Collection Time: 07/09/22  8:25 PM   Specimen: BLOOD RIGHT FOREARM  Result Value Ref Range Status   Specimen Description BLOOD RIGHT FOREARM  Final   Special Requests   Final    BOTTLES DRAWN AEROBIC AND ANAEROBIC Blood Culture results may not be optimal due to an inadequate volume of blood received in culture bottles   Culture   Final    NO GROWTH 3 DAYS Performed at [facility], [place] [address], [place], [place] [address]     Report Status PENDING  Incomplete         Radiology Studies: DG CHEST PORT 1 VIEW  Result Date: 07/12/2022 CLINICAL DATA:  RIGHT pleural effusion EXAM: PORTABLE CHEST 1 VIEW COMPARISON:  Portable exam 0633 hours compared to 07/11/2022 FINDINGS: Pigtail RIGHT thoracostomy tube unchanged. Normal heart size, mediastinal contours, and pulmonary vascularity. Aorta Persistent small RIGHT apex pneumothorax despite thoracostomy tube. Associated mild RIGHT basilar atelectasis and questionable small sub pulmonic effusion. No acute infiltrate or acute osseous findings. IMPRESSION: Persistent small RIGHT apex pneumothorax and RIGHT basilar atelectasis with questionable small subpulmonic RIGHT pleural effusion. Electronically Signed   By: Lavonia Dana M.D.   On: 07/12/2022 10:01   DG Chest Port 1 View  Result Date: 07/11/2022 CLINICAL DATA:  Follow-up chest 2 EXAM: PORTABLE CHEST 1 VIEW COMPARISON:  07/10/2022 FINDINGS: Right chest tube remains in place. Small amount of pleural air at the apex persists, less than 5%. Some atelectasis persists in the right lower lung. Chronic aortic atherosclerosis. No new finding. IMPRESSION: No significant change. Right chest tube remains in place. Small amount of pleural air at the apex persists, less than 5%. Electronically Signed   By: Nelson Chimes M.D.   On: 07/11/2022 08:18        Scheduled Meds:  docusate sodium  100 mg Oral BID   enoxaparin (LOVENOX) injection  30 mg Subcutaneous Q24H   metoprolol tartrate  12.5 mg Oral BID   sodium chloride flush  10 mL Intrapleural Q8H   sodium chloride flush  3 mL Intravenous Q12H   Continuous Infusions:  azithromycin 500 mg (07/12/22 1551)   cefTRIAXone (ROCEPHIN)  IV 1 g (07/12/22 1437)     LOS: 3 days        Phillips Climes, MD [facility]   To contact the attending provider between 7A-7P or the covering provider during after hours 7P-7A, please log into the web site [email or web address] and access using  universal [place] password for that web site. If you do not have the password, please call the hospital operator.  07/12/2022, 5:11 PM

## 2022-07-13 ENCOUNTER — Inpatient Hospital Stay (HOSPITAL_COMMUNITY): Payer: Medicare Other

## 2022-07-13 ENCOUNTER — Other Ambulatory Visit (HOSPITAL_COMMUNITY): Payer: Self-pay

## 2022-07-13 ENCOUNTER — Telehealth: Payer: Self-pay | Admitting: Pulmonary Disease

## 2022-07-13 ENCOUNTER — Telehealth (HOSPITAL_COMMUNITY): Payer: Self-pay | Admitting: Pharmacy Technician

## 2022-07-13 DIAGNOSIS — R918 Other nonspecific abnormal finding of lung field: Secondary | ICD-10-CM

## 2022-07-13 DIAGNOSIS — I10 Essential (primary) hypertension: Secondary | ICD-10-CM

## 2022-07-13 DIAGNOSIS — J90 Pleural effusion, not elsewhere classified: Secondary | ICD-10-CM | POA: Diagnosis not present

## 2022-07-13 LAB — BODY FLUID CULTURE W GRAM STAIN: Culture: NO GROWTH

## 2022-07-13 LAB — CBC
HCT: 33.7 % — ABNORMAL LOW (ref 36.0–46.0)
Hemoglobin: 11.9 g/dL — ABNORMAL LOW (ref 12.0–15.0)
MCH: 33.7 pg (ref 26.0–34.0)
MCHC: 35.3 g/dL (ref 30.0–36.0)
MCV: 95.5 fL (ref 80.0–100.0)
Platelets: 345 10*3/uL (ref 150–400)
RBC: 3.53 MIL/uL — ABNORMAL LOW (ref 3.87–5.11)
RDW: 12.3 % (ref 11.5–15.5)
WBC: 10.4 10*3/uL (ref 4.0–10.5)
nRBC: 0 % (ref 0.0–0.2)

## 2022-07-13 LAB — BASIC METABOLIC PANEL
Anion gap: 8 (ref 5–15)
BUN: 14 mg/dL (ref 8–23)
CO2: 22 mmol/L (ref 22–32)
Calcium: 8.5 mg/dL — ABNORMAL LOW (ref 8.9–10.3)
Chloride: 99 mmol/L (ref 98–111)
Creatinine, Ser: 0.54 mg/dL (ref 0.44–1.00)
GFR, Estimated: >60 mL/min (ref >60)
Glucose, Bld: 111 mg/dL — ABNORMAL HIGH (ref 70–99)
Potassium: 3.8 mmol/L (ref 3.5–5.1)
Sodium: 129 mmol/L — ABNORMAL LOW (ref 135–145)

## 2022-07-13 NOTE — Progress Notes (Addendum)
[place] Progress Note Patient Name: Gail Jennings DOB: May 29, 1933 MRN: [id number]   Date of Service  07/13/2022  HPI/Events of Note  Called by radiology with CXR finding post removal of right pigtail catheter. The right pneumothorax has increased in size from prior exam, currently 3.4 cm when measured from under the right first rib, previously 1.3 cm. RR = 17-21. No sat available on bedside monitor.   eICU Interventions  PCCM ground team notified of above CXR findings.      Intervention Category Major Interventions: Other:  [name] 07/13/2022, 8:55 PM

## 2022-07-13 NOTE — Progress Notes (Signed)
PCCM INTERVAL PROGRESS NOTE   Called to bedside to evaluate patient in the setting of increased pneumothorax on repeat chest x ray. Patient had pigtail tube placed 11/5 for right sided pleural effusion. Draining decreased. Only 90cc out 11/7 to 11/8 and tube was discontinued. At the time of tube DC there was a small stable apical pneumothorax which is now slightly increased on follow-up CXR post tube removal.   Upon my arrival patient is in no distress and has had no change in her overall condition. Denies SOB and chest tightness. O2 sats 96% on room air. Dressing site evaluated and dressing is not totally occlusive. I wonder if additional air was introduced at the time to tube DC.   Plan: - reinforce occlusive dressing - Provide O2 via nasal cannula - repeat CXR in AM - if any change in respiratory status bedside RN instructed to order a stat portable chest xray and call PCCM.    Georgann Housekeeper, AGACNP-BC [facility]  See Amion for personal pager PCCM on call pager [phone] until 7pm. Please call Elink 7p-7a. [phone]  07/13/2022 9:40 PM

## 2022-07-13 NOTE — Progress Notes (Signed)
Called by RN for assistance to pull chest tube. Right sided chest tube removed per order without complications. CXR placed per MD. Occlusive dressing placed, no drainage present post removal.

## 2022-07-13 NOTE — Telephone Encounter (Signed)
Pharmacy Patient Advocate Encounter  Insurance verification completed.    The patient is insured through [facility] Part D   The patient is currently admitted and ran test claims for the following: Eliquis.  Copays and coinsurance results were relayed to Inpatient clinical team.

## 2022-07-13 NOTE — Progress Notes (Signed)
   Request received for Lung mass biopsy I have reviewed imaging with Dr Vernard Gambles  He would like MD to order PET first - to determine all available and safest sites  MD made aware

## 2022-07-13 NOTE — Progress Notes (Signed)
Physical Therapy Treatment Patient Details Name: Gail Jennings MRN: [id number] DOB: [birth year] Today's Date: 07/13/2022   History of Present Illness 88 y.o. female presenting to ED 07/09/22 with 4 days of SOB, wheezing, respiratory distress, cough, diarrhea. Imaging revealed large R lung pleural effusion and pt found to be in afib rate as high as 150s.  s/p 11/4 thoracentesis. 11/5 R chest tube placed. Chest tube removed 11/8. PMH:HTN and atrial fibrillation    PT Comments    Seen prior to chest tube removal and cleared to disconnect from suction by RN. Patient ambulating hallway distance with HHAx1 and min guard for safety. She feels as though she needs something to grasp with mobility. Encouraged use of cane for support. Patient has flight of stairs in her home and will need to practice next session prior to returning home. D/c plan remains appropriate at this time.     Recommendations for follow up therapy are one component of a multi-disciplinary discharge planning process, led by the attending physician.  Recommendations may be updated based on patient status, additional functional criteria and insurance authorization.  Follow Up Recommendations  Home health PT     Assistance Recommended at Discharge Frequent or constant Supervision/Assistance  Patient can return home with the following A little help with walking and/or transfers;A little help with bathing/dressing/bathroom;Assistance with cooking/housework;Direct supervision/assist for medications management;Assist for transportation;Help with stairs or ramp for entrance   Equipment Recommendations  Cane    Recommendations for Other Services       Precautions / Restrictions Precautions Precautions: Fall Precaution Comments: Chest tube hooked to suction but cleared with RN to unhook for mobility Restrictions Weight Bearing Restrictions: No     Mobility  Bed Mobility               General bed mobility comments: in  recliner on arrival    Transfers Overall transfer level: Needs assistance Equipment used: None Transfers: Sit to/from Stand Sit to Stand: Supervision           General transfer comment: supervision for safety.    Ambulation/Gait Ambulation/Gait assistance: Min guard Gait Distance (Feet): 120 Feet Assistive device: 1 person hand held assist Gait Pattern/deviations: Step-through pattern, Decreased stride length, Drifts right/left Gait velocity: decreased     General Gait Details: initially unsteady and reaching for objects to grasp. Provided HHAx1 and able to ambulate hallway distance with min guard for safety   Stairs             Wheelchair Mobility    Modified Rankin (Stroke Patients Only)       Balance Overall balance assessment: Needs assistance Sitting-balance support: Feet supported, No upper extremity supported Sitting balance-Leahy Scale: Good     Standing balance support: During functional activity, No upper extremity supported, Single extremity supported Standing balance-Leahy Scale: Fair                              Cognition Arousal/Alertness: Awake/alert Behavior During Therapy: Impulsive Overall Cognitive Status: No family/caregiver present to determine baseline cognitive functioning                                 General Comments: impulsive with decreased safety awareness of lines and room setup. No family present to determine baseline cognition        Exercises      General Comments  Pertinent Vitals/Pain Pain Assessment Pain Assessment: Faces Faces Pain Scale: Hurts little more Pain Location: chest tube site with movement Pain Descriptors / Indicators: Grimacing, Guarding Pain Intervention(s): Monitored during session    Home Living                          Prior Function            PT Goals (current goals can now be found in the care plan section) Acute Rehab PT  Goals Patient Stated Goal: go home PT Goal Formulation: With patient Time For Goal Achievement: 07/25/22 Potential to Achieve Goals: Good Progress towards PT goals: Progressing toward goals    Frequency    Min 3X/week      PT Plan Current plan remains appropriate    Co-evaluation              AM-PAC PT "6 Clicks" Mobility   Outcome Measure  Help needed turning from your back to your side while in a flat bed without using bedrails?: None Help needed moving from lying on your back to sitting on the side of a flat bed without using bedrails?: None Help needed moving to and from a bed to a chair (including a wheelchair)?: A Little Help needed standing up from a chair using your arms (e.g., wheelchair or bedside chair)?: A Little Help needed to walk in hospital room?: A Little Help needed climbing 3-5 steps with a railing? : A Lot 6 Click Score: 19    End of Session   Activity Tolerance: Patient tolerated treatment well Patient left: in chair;with call bell/phone within reach Nurse Communication: Mobility status PT Visit Diagnosis: Unsteadiness on feet (R26.81);Difficulty in walking, not elsewhere classified (R26.2)     Time: 1962-2297 PT Time Calculation (min) (ACUTE ONLY): 13 min  Charges:  $Therapeutic Activity: 8-22 mins                     Gail Jennings Rile PT, DPT Acute Rehabilitation Services Office [phone]    Linna Hoff 07/13/2022, 4:56 PM

## 2022-07-13 NOTE — Progress Notes (Signed)
[facility] PROGRESS NOTE  Gail Jennings (DOB: 26-Dec-1932) BEM:[id number] PCP: Burnard Bunting, MD  Brief Narrative: Gail Jennings is an 88 y.o. female with a history of PAF not on anticoagulation, HTN who presented to the ED on 07/09/2022 with dyspnea, decreased activity tolerance found to be hypoxic with AFib with RVR and large right pleural effusion. Diltiazem bolus administered with conversion to NSR. PCCM consulted, drew 1,200cc pleural fluid found to be exudative, lymphocytic predominant. due to recurrence, chest tube placed 11/5, ultimately removed 11/8. CT chest showed small right apical pneumothorax, nodular interlobular septal thickening, right lower lobe pleural nodularity and questionable right lower lobe lung mass. Cytology initially interpreted as negative for malignancy.  Subjective: Shortness of breath improved overall, still feels weak and has pain related to the chest tube. Denies recent weight loss, night sweats.  Objective: BP 127/65 (BP Location: Left Arm)   Pulse 74   Temp (!) 97.5 F (36.4 C) (Oral)   Resp 18   Ht 5\' 3"  (1.6 m)   Wt 49 kg   SpO2 95%   BMI 19.13 kg/m   Gen: Elderly, thin but active female in no distress Pulm: Clear, nonlabored  CV: RRR, no edema GI: Soft, NT, ND, +BS  Neuro: Alert and oriented. No new focal deficits. Ext: Warm, no deformities Skin: CT site c/d/I, no other rashes, lesions or ulcers on visualized skin   Assessment & Plan: Acute hypoxic respiratory failure: due to right pleural effusion, ?right lung mass, nodularity on CT. Hypoxia resolved.  - Continue empiric abx (?empyema necessitans though gram stain and Cx negative) - Chest tube management per PCCM. IR consulted for consideration of biopsy, prefer to have PET scan to identify optimal target. PCCM is having this arranged as an outpatient.  - Addendum: I've received verbal report that this patient's cytology results were mistakenly reported on another patient and  that the correct interpretation of this patient's cytology is "adenocarcinoma." This has not been formally updated at this time, still reading as "severe inflammation" and "no malignant cells identified," but if it were confirmed, would obviate need for biopsy. Will d/w oncology as appropriate once report is finalized.  - Repeat serial CXR  Pleural effusion with pleurocutaneous fistula: No directed therapy at this time per PCCM  PAF with RVR: Converted to NSR with diltiazem bolus. Pt not on Tx for this since Dx Dec 2020.  - Continue new low dose metoprolol - Continue lovenox for [employer] pending finalized procedural plan.   HTN:  - Hold ARB, started BB as above  Hyponatremia: Stable at 129, asymptomatic.  - consider further work up if worsens.   [name], MD [facility] [email or web address] 07/13/2022, 6:26 PM

## 2022-07-13 NOTE — TOC Benefit Eligibility Note (Signed)
Patient Teacher, English as a foreign language completed.    The patient is currently admitted and upon discharge could be taking Eliquis 5 mg.  The current 30 day co-pay is $30.00.   The patient is insured through [place] [place] Patient Advocate Specialist [facility] Patient Advocate Team Direct Number: [phone]  Fax: [phone]

## 2022-07-13 NOTE — Progress Notes (Signed)
   NAME:  Gail Jennings, MRN:  [id number], DOB:  07/11/1933, LOS: 4 ADMISSION DATE:  07/09/2022, CONSULTATION DATE:  07/09/2022 REFERRING MD:  Lorin Mercy, EDP, CHIEF COMPLAINT:  Shortness of Breath    History of Present Illness:  88 year-old-female with significant past medical history per chart review of paroxysmal Atrial Fib (not on blood thinners) presents via EMS with complaints of worsening shortness of breath, cough w/ white sputum, x2 days following about a week of decreased activity tolerance. Her friend endorses hearing audible wheezing as well. She reports a recent history of nausea, diarrhea, and cough. EMS reported on their arrival she was in A. Fib in the 150s, and was given Cardizem 20mg  and 319mL NS bolus. On evaluation in the ED, she remains short of breath and is now in NSR.    CXR wl large right effusion PCCM asked to eval   Pertinent  Medical History  Paroxsymal A. Fib   Significant Hospital Events: Including procedures, antibiotic start and stop dates in addition to other pertinent events   Right thora 1200 ml out. Sent for analysis 11/4-->exudate 11/5 right chest tube placed  7/8 CT chest shows small right apical pneumothorax, nodular interlobular septal thickening, right lower lobe pleural nodularity and questionable right lower lobe lung mass  Interim History / Subjective:   But 90 cc drainage from chest tube. Complains of pain at chest tube site Afebrile, no dyspnea Objective   Blood pressure 127/65, pulse 74, temperature (!) 97.5 F (36.4 C), temperature source Oral, resp. rate 18, height 5\' 3"  (1.6 m), weight 49 kg, SpO2 95 %.        Intake/Output Summary (Last 24 hours) at 07/13/2022 1130 Last data filed at 07/13/2022 0736 Gross per 24 hour  Intake 250 ml  Output 160 ml  Net 90 ml    Filed Weights   07/09/22 1228 07/09/22 2348  Weight: 49 kg 49 kg    Examination:  General: Frail elderly female HEENT: MM pink/moist no JVD the Neuro: Grossly  intact CV: S1-S2 regular PULM: Decreased breath sounds right base, clear on left, no accessory muscle use GI: soft, bsx4 active  GU: Voids Extremities: warm/dry, negative edema  Skin: no rashes or lesions, right fistula chest with dressing unremarkable   Chest x-ray reviewed shows small right apical pneumothorax, unchanged right lower lobe opacity  Resolved Hospital Problem list     Assessment & Plan:  Acute hypoxic respiratory failure 2/2  large right pleural effusion. Lymphocytic exudate.  Now s/p tube thoracostomy 11/5  Cytology negative Plan  Chest tube can be discontinued but will await IR plan for biopsy  Right lung mass with pleural nodularity -discussed options including outpatient PET scan followed by biopsy versus biopsy now, discussed risks and benefits -Hold apixaban. We will consult IR for biopsy -We will leave pigtail in until biopsy performed    Kara Mead MD. [facility]. [facility] Pager : [phone]  If no response to pager , please call [phone] until 7 pm After 7:00 pm call Elink  [phone]     07/13/2022, 11:30 AM

## 2022-07-13 NOTE — Telephone Encounter (Signed)
PET scan order placed per Dr Angus Palms request for right lower lobe mass. Pt is currently admitted into the hospital so unable to talk to patient for update on scan. Patient will need an appointment with Dr Elsworth Soho or an NP after PET scan is completed to go over results. Nothing further needed

## 2022-07-13 NOTE — Telephone Encounter (Signed)
Please schedule outpatient PET scan and hospital follow-up with APP after PET scan -She had a lymphocytic exudative effusion CT chest shows pleural nodularity possible right lower lobe lung mass. Radiology wanted outpatient PET scan before scheduling biopsy

## 2022-07-14 ENCOUNTER — Inpatient Hospital Stay (HOSPITAL_COMMUNITY): Payer: Medicare Other

## 2022-07-14 DIAGNOSIS — J90 Pleural effusion, not elsewhere classified: Secondary | ICD-10-CM | POA: Diagnosis not present

## 2022-07-14 DIAGNOSIS — J9383 Other pneumothorax: Secondary | ICD-10-CM | POA: Diagnosis not present

## 2022-07-14 LAB — CULTURE, BLOOD (ROUTINE X 2): Culture: NO GROWTH

## 2022-07-14 NOTE — Progress Notes (Addendum)
   NAME:  Gail Jennings, MRN:  [id number], DOB:  06/18/1933, LOS: 5 ADMISSION DATE:  07/09/2022, CONSULTATION DATE:  07/09/2022 REFERRING MD:  Lorin Mercy, EDP, CHIEF COMPLAINT:  Shortness of Breath    History of Present Illness:  88 year-old-female with significant past medical history per chart review of paroxysmal Atrial Fib (not on blood thinners) presents via EMS with complaints of worsening shortness of breath, cough w/ white sputum, x2 days following about a week of decreased activity tolerance. Her friend endorses hearing audible wheezing as well. She reports a recent history of nausea, diarrhea, and cough. EMS reported on their arrival she was in A. Fib in the 150s, and was given Cardizem 20mg  and 330mL NS bolus. On evaluation in the ED, she remains short of breath and is now in NSR.    CXR wl large right effusion PCCM asked to eval   Pertinent  Medical History  Paroxsymal A. Fib   Significant Hospital Events: Including procedures, antibiotic start and stop dates in addition to other pertinent events   Right thora 1200 ml out. Sent for analysis 11/4-->exudate 11/5 right chest tube placed  7/8 CT chest shows small right apical pneumothorax, nodular interlobular septal thickening, right lower lobe pleural nodularity and questionable right lower lobe lung mass  Interim History / Subjective:  Right pneumothorax status post chest tube removal with no improvement Objective   Blood pressure 125/70, pulse 73, temperature (!) 97.5 F (36.4 C), temperature source Oral, resp. rate (!) 22, height 5\' 3"  (1.6 m), weight 49 kg, SpO2 99 %.        Intake/Output Summary (Last 24 hours) at 07/14/2022 1128 Last data filed at 07/14/2022 1038 Gross per 24 hour  Intake 500 ml  Output --  Net 500 ml   Filed Weights   07/09/22 1228 07/09/22 2348  Weight: 49 kg 49 kg    Examination:   General: Frail elderly female no acute distress HEENT: MM pink/moist no JVD Neuro: Anxious but intact CV: Heart  sounds are regular PULM: Decreased breath sounds throughout unable to pull more than 200 cc under incentive spirometer Noted to have stridor/vocal cord dysfunction which cleared up with pursed lip breathing  GI: soft, bsx4 active  GU: Voids Extremities: warm/dry, negative edema  Skin: no rashes or lesions    Chest x-ray with right pneumothorax stable as not larger  Resolved Hospital Problem list     Assessment & Plan:  Acute hypoxic respiratory failure 2/2  large right pleural effusion. Lymphocytic exudate.  Now s/p tube thoracostomy 11/5  Cytology negative  Plan Serial chest x-rays May need pigtail replaced Stat portable chest x-ray concern for increasing pneumothorax  Right lung mass with pleural nodularity -discussed options including outpatient PET scan followed by biopsy versus biopsy now, discussed risks and benefits -Hold apixaban. We will consult IR for biopsy, may need pigtail catheter replaced    Richardson Landry Sulo Janczak ACNP Acute Care Nurse Practitioner [place] Please consult [place] 07/14/2022, 11:28 AM

## 2022-07-14 NOTE — Progress Notes (Signed)
[facility] PROGRESS NOTE  Gail Jennings (DOB: 07/12/1933) GEZ:[id number] PCP: Burnard Bunting, MD  Brief Narrative: Gail Jennings is an 88 y.o. female with a history of PAF not on anticoagulation, HTN who presented to the ED on 07/09/2022 with dyspnea, decreased activity tolerance found to be hypoxic with AFib with RVR and large right pleural effusion. Diltiazem bolus administered with conversion to NSR. PCCM consulted, drew 1,200cc pleural fluid found to be exudative, lymphocytic predominant. due to recurrence, chest tube placed 11/5, ultimately removed 11/8. CT chest showed small right apical pneumothorax, nodular interlobular septal thickening, right lower lobe pleural nodularity and questionable right lower lobe lung mass. Cytology initially interpreted as negative for malignancy, but may actually show adenocarcinoma (final report pending).  Subjective: Is shaken by news of verbal report of adenocarcinoma. Denies dyspnea or chest pain since tube out. No other complaints currently.  Objective: BP (!) 105/48   Pulse 68   Temp (!) 97.5 F (36.4 C) (Oral)   Resp 17   Ht 5\' 3"  (1.6 m)   Wt 49 kg   SpO2 100%   BMI 19.13 kg/m   Gen: Pleasant, elderly female in no distress Pulm: Nonlabored, clear bilaterally, diminished on right. CV: RRR, no MRG or JVD  GI: soft, NT Neuro: Alert, oriented without focal deficits   Assessment & Plan: Acute hypoxic respiratory failure: due to right pleural effusion, right lung mass, nodularity on CT. Hypoxia resolved.  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 - s/p thoracostomy tube 11/5-11/8. Cytology initially reported as negative with verbal communication that it is actually Goessel. I've spoken with pathology about this further as the report still is not updated. Special staining is still pending and  final diagnosis will be reported this afternoon or tomorrow morning, I'm told. Based on the clinical constellation (mass/nodularity, lymphocytic exudative effusion) and report, discussed with patient suspicion for cancer and next steps. Will await finalized report prior to contacting oncology. Plan will be for outpatient PET scan and then possible biopsy if further tissue is required diagnostically.   Pneumothorax: post thoracostomy. Stable on my interpretation of CXR, fortunately no respiratory distress at this time.  - Supply supplemental oxygen, monitor CXR in AM. With current clinical stability, there is no plan to reinsert CT.  PAF with RVR: Converted to NSR with diltiazem bolus. Pt not on Tx for this since Dx Dec 2020.  - Continue new low dose metoprolol - Continue lovenox for [facility] pending finalized procedural plan.   HTN:  - Hold ARB, started BB as above  Hyponatremia: Stable at 129, asymptomatic.  - Consider further work up if worsens.   [name], MD [facility] [email or web address] 07/14/2022, 3:04 PM

## 2022-07-14 NOTE — Progress Notes (Signed)
Mobility Specialist - Progress Note   07/14/22 1343  Mobility  Activity Transferred to/from [facility]  Level of Assistance Standby assist, set-up cues, supervision of patient - no hands on  Assistive Device None  Activity Response Tolerated well  Mobility Referral Yes  $Mobility charge 1 Mobility   Pt received in bed and requesting to use BSC. No complaints throughout. Pt was left on BSC and instructed to use call bell when finished.   Franki Monte  Mobility Specialist Please contact via Solicitor or Rehab office at [phone]

## 2022-07-14 NOTE — Progress Notes (Signed)
Mobility Specialist - Progress Note   07/14/22 1610  Mobility  Activity Ambulated with assistance in hallway  Level of Assistance Contact guard assist, steadying assist  Assistive Device Other (Comment) (HHA)  Distance Ambulated (ft) 450 ft  Activity Response Tolerated well  Mobility Referral Yes  $Mobility charge 1 Mobility   Pt received in chair and very eager for mobility. No complaints throughout ambulation. Pt was returned to bed with all needs met.   Franki Monte  Mobility Specialist Please contact via Solicitor or Rehab office at [phone]

## 2022-07-14 NOTE — Care Management Important Message (Signed)
Important Message  Patient Details  Name: Gail Jennings MRN: [id number] Date of Birth: [birth year]   Medicare Important Message Given:  Yes     Shelda Altes 07/14/2022, 11:05 AM

## 2022-07-15 ENCOUNTER — Inpatient Hospital Stay (HOSPITAL_COMMUNITY): Payer: Medicare Other

## 2022-07-15 DIAGNOSIS — R0603 Acute respiratory distress: Secondary | ICD-10-CM | POA: Diagnosis not present

## 2022-07-15 LAB — CHOLESTEROL, BODY FLUID: Cholesterol, Fluid: 102 mg/dL

## 2022-07-15 NOTE — Progress Notes (Signed)
Physical Therapy Treatment Patient Details Name: Gail Jennings MRN: [id number] DOB: Sep 09, 1932 Today's Date: 07/15/2022   History of Present Illness 88 y.o. female presenting to ED 07/09/22 with 4 days of SOB, wheezing, respiratory distress, cough, diarrhea. Imaging revealed large R lung pleural effusion and pt found to be in afib rate as high as 150s.  s/p 11/4 thoracentesis. 11/5 R chest tube placed. Chest tube removed 11/8. PMH:HTN and atrial fibrillation    PT Comments    Pt sleeping upon arrival, agreeable to OOB mobility and ambulation in room with use of SPC. She initially needed increased assist to steady with gait, but improved with continued mobility. Declined offers to practice stair navigation at this time due to recently waking up, asking PT to return later in afternoon to practice stair training. Will continue to benefit from skilled PT to progress functional strength, stability, and endurance.    Recommendations for follow up therapy are one component of a multi-disciplinary discharge planning process, led by the attending physician.  Recommendations may be updated based on patient status, additional functional criteria and insurance authorization.  Follow Up Recommendations  Home health PT     Assistance Recommended at Discharge Frequent or constant Supervision/Assistance  Patient can return home with the following A little help with walking and/or transfers;A little help with bathing/dressing/bathroom;Assistance with cooking/housework;Direct supervision/assist for medications management;Assist for transportation;Help with stairs or ramp for entrance   Equipment Recommendations  Cane    Recommendations for Other Services       Precautions / Restrictions Precautions Precautions: Fall Precaution Comments: watch O2 Restrictions Weight Bearing Restrictions: No     Mobility  Bed Mobility Overal bed mobility: Modified Independent             General bed  mobility comments: no assist needed    Transfers Overall transfer level: Needs assistance Equipment used: None Transfers: Sit to/from Stand Sit to Stand: Supervision, Min guard           General transfer comment: supervision from EOB, minG from low toilet    Ambulation/Gait Ambulation/Gait assistance: Min guard Gait Distance (Feet): 15 Feet (+20ft) Assistive device: Straight cane Gait Pattern/deviations: Step-through pattern, Decreased stride length, Drifts right/left Gait velocity: decreased     General Gait Details: initially unsteady and reaching for objects to grasp. Provided cane to L hand with improved stability and no additional assist. pt reports limited at this time due to fatigue   Stairs Stairs:  (pt declined due to just reccently waking up)              Balance Overall balance assessment: Needs assistance Sitting-balance support: Feet supported, No upper extremity supported Sitting balance-Leahy Scale: Good     Standing balance support: During functional activity, No upper extremity supported, Single extremity supported Standing balance-Leahy Scale: Fair                              Cognition Arousal/Alertness: Awake/alert Behavior During Therapy: Impulsive Overall Cognitive Status: No family/caregiver present to determine baseline cognitive functioning                                 General Comments: impulsive with decreased safety awareness of lines and room setup. No family present to determine baseline cognition        Exercises      General Comments General comments (skin integrity, edema,  etc.): VSS on 3L O2      Pertinent Vitals/Pain Pain Assessment Pain Assessment: No/denies pain Pain Intervention(s): Monitored during session     PT Goals (current goals can now be found in the care plan section) Acute Rehab PT Goals Patient Stated Goal: go home PT Goal Formulation: With patient Time For Goal  Achievement: 07/25/22 Potential to Achieve Goals: Good Progress towards PT goals: Progressing toward goals    Frequency    Min 3X/week      PT Plan Current plan remains appropriate       AM-PAC PT "6 Clicks" Mobility   Outcome Measure  Help needed turning from your back to your side while in a flat bed without using bedrails?: None Help needed moving from lying on your back to sitting on the side of a flat bed without using bedrails?: None Help needed moving to and from a bed to a chair (including a wheelchair)?: A Little Help needed standing up from a chair using your arms (e.g., wheelchair or bedside chair)?: A Little Help needed to walk in hospital room?: A Little Help needed climbing 3-5 steps with a railing? : A Lot 6 Click Score: 19    End of Session Equipment Utilized During Treatment: Oxygen Activity Tolerance: Patient tolerated treatment well Patient left: with call bell/phone within reach;in bed Nurse Communication: Mobility status PT Visit Diagnosis: Unsteadiness on feet (R26.81);Difficulty in walking, not elsewhere classified (R26.2)     Time: 3382-5053 PT Time Calculation (min) (ACUTE ONLY): 14 min  Charges:  $Therapeutic Exercise: 8-22 mins                     [place], PT, DPT   Acute Rehabilitation Department   Sandra Cockayne 07/15/2022, 2:55 PM

## 2022-07-15 NOTE — Progress Notes (Signed)
Mobility Specialist Progress Note:   07/15/22 1201  Mobility  Activity Ambulated with assistance in hallway  Level of Assistance Contact guard assist, steadying assist  Assistive Device  (HHA)  Distance Ambulated (ft) 500 ft  Activity Response Tolerated well  $Mobility charge 1 Mobility   Pre- Mobility:96% SpO2 During Mobility: 90%SpO2 Post Mobility: 93% SpO2  Pt received in bed willing to participate in mobility. No complaints of pain. Left in chair with call bell I reach and all needs met.   [facility] Surveyor, mining Chat only

## 2022-07-15 NOTE — Progress Notes (Signed)
[facility] PROGRESS NOTE  Gail Jennings (DOB: [birth year]) PPJ:[id number] PCP: Burnard Bunting, MD  Brief Narrative: Gail Jennings is an 88 y.o. female with a history of PAF not on anticoagulation, HTN who presented to the ED on 07/09/2022 with dyspnea, decreased activity tolerance found to be hypoxic with AFib with RVR and large right pleural effusion. Diltiazem bolus administered with conversion to NSR. PCCM consulted, drew 1,200cc pleural fluid found to be exudative, lymphocytic predominant. due to recurrence, chest tube placed 11/5, ultimately removed 11/8. CT chest showed small right apical pneumothorax, nodular interlobular septal thickening, right lower lobe pleural nodularity and questionable right lower lobe lung mass. Cytology initially interpreted as negative for malignancy, but may actually show adenocarcinoma (final report pending).  Subjective: Pt anxious, many many questions about next steps with lung malignancy. Her breathing is stable, no chest pain. Wants to get up and moving more while here and doesn't want to go home for several days. If she needs to go to SNF, wants to go to [place] where her PCP rounds.  Objective: BP 135/69 (BP Location: Left Arm)   Pulse 64   Temp 98.5 F (36.9 C) (Oral)   Resp 18   Ht 5\' 3"  (1.6 m)   Wt 49 kg   SpO2 100%   BMI 19.13 kg/m   Gen: Elderly female in no distress, thin. Pulm: Very diminished on right, no crackles or wheezes on left. CV: RRR, no MRG GI: +BS, soft, NT, ND Neuro: Alert, oriented with no focal deficits.  Assessment & Plan: Acute hypoxic respiratory failure: due to right pleural effusion, right lung mass, nodularity on CT. Hypoxia resolved.  - Completed empiric abx. - s/p thoracostomy tube 11/5-11/8. No reinsertion currently planned, pt's symptoms improved.   Lung adenocarcinoma: Cytology erroneously noted to be negative, though this has been amended to convey that there were malignant cells  identified as adenocarcinoma +TTF1 consistent with pulmonary primary. This is consistent with R lung mass/nodularity and malignant pleural effusion.  - Discussed with Dr. Alvy Bimler today, will confirm that the patient follows up with Dr. Julien Nordmann, and/or if patient still here Monday, Dr. Julien Nordmann will formally consult at that time.  - PET to be arranged through pulmonary after discharge.    Pneumothorax: post thoracostomy. Stable on my interpretation of CXR, fortunately no respiratory distress at this time.  - Supply supplemental oxygen, stable hydropneumothorax on CXR today. With current clinical stability, there is no plan to reinsert CT, though will require continued inpatient monitoring.  PAF with RVR: Converted to NSR with diltiazem bolus. Pt not on Tx for this since Dx Dec 2020.  - Continue new low dose metoprolol - Continue lovenox for [facility] pending finalized procedural plan.   HTN:  - Hold ARB, started BB as above  Hyponatremia: Stable at 129, asymptomatic.  - Consider further work up if worsens.   [name], MD [facility] [email or web address] 07/15/2022, 12:03 PM

## 2022-07-15 NOTE — Progress Notes (Signed)
   NAME:  LATISA BELAY, MRN:  [id number], DOB:  1932/11/27, LOS: 6 ADMISSION DATE:  07/09/2022, CONSULTATION DATE:  07/09/2022 REFERRING MD:  Lorin Mercy, EDP, CHIEF COMPLAINT:  Shortness of Breath    History of Present Illness:  88 year-old-female with significant past medical history per chart review of paroxysmal Atrial Fib (not on blood thinners) presents via EMS with complaints of worsening shortness of breath, cough w/ white sputum, x2 days following about a week of decreased activity tolerance. Her friend endorses hearing audible wheezing as well. She reports a recent history of nausea, diarrhea, and cough. EMS reported on their arrival she was in A. Fib in the 150s, and was given Cardizem 20mg  and 374mL NS bolus. On evaluation in the ED, she remains short of breath and is now in NSR.    CXR wl large right effusion PCCM asked to eval   Pertinent  Medical History  Paroxsymal A. Fib   Significant Hospital Events: Including procedures, antibiotic start and stop dates in addition to other pertinent events   Right thora 1200 ml out. Sent for analysis 11/4-->exudate 11/5 right chest tube placed  7/8 CT chest shows small right apical pneumothorax, nodular interlobular septal thickening, right lower lobe pleural nodularity and questionable right lower lobe lung mass  Interim History / Subjective:  Right right pneumothorax persists Objective   Blood pressure 135/69, pulse 64, temperature 98.5 F (36.9 C), temperature source Oral, resp. rate 18, height 5\' 3"  (1.6 m), weight 49 kg, SpO2 100 %.        Intake/Output Summary (Last 24 hours) at 07/15/2022 0852 Last data filed at 07/15/2022 0500 Gross per 24 hour  Intake 2071.42 ml  Output --  Net 2071.42 ml   Filed Weights   07/09/22 1228 07/09/22 2348  Weight: 49 kg 49 kg    Examination:   General: Frail elderly female no acute distress HEENT: MM pink/moist no JVD Neuro: Anxious but intact CV: Heart sounds are irregular PULM:  Diminished on the right GI: soft, bsx4 active  GU: Voids Extremities: warm/dry, negative edema  Skin: no rashes or lesions      Resolved Hospital Problem list     Assessment & Plan:  Acute hypoxic respiratory failure 2/2  large right pleural effusion. Lymphocytic exudate.  Now s/p tube thoracostomy 11/5  Cytology negative ? Positive adenocarcinoma  Plan Await final results of cytology Right pneumothorax still present May need oncology prior to discharge if cytology is positive Serial chest x-ray     Right lung mass with pleural nodularity -discussed options including outpatient PET scan followed by biopsy versus biopsy now, discussed risks and benefits -Hold apixaban. We will consult IR for biopsy, may need pigtail catheter replaced    Richardson Landry Rubens [place]ston ACNP Acute Care Nurse Practitioner [place] Please consult [place] 07/15/2022, 8:52 AM

## 2022-07-15 NOTE — Progress Notes (Signed)
Physical Therapy Treatment Patient Details Name: Gail Jennings MRN: [id number] DOB: 06/11/1933 Today's Date: 07/15/2022   History of Present Illness 88 y.o. female presenting to ED 07/09/22 with 4 days of SOB, wheezing, respiratory distress, cough, diarrhea. Imaging revealed large R lung pleural effusion and pt found to be in afib rate as high as 150s.  s/p 11/4 thoracentesis. 11/5 R chest tube placed. Chest tube removed 11/8. PMH:HTN and atrial fibrillation    PT Comments    The pt was agreeable to session with focus on stair training and use of cane, pt reporting significant anxiety about stair navigation, but was able to complete 2 x2 and then 5 in a row with BUE support on rails and minG for safety. She was also educated on use of SPC, but was able to walk both with and without cane without need for physical assist or LOB. Will continue to benefit from max mobility and PT to progress functional strength and endurance.     Recommendations for follow up therapy are one component of a multi-disciplinary discharge planning process, led by the attending physician.  Recommendations may be updated based on patient status, additional functional criteria and insurance authorization.  Follow Up Recommendations  Home health PT     Assistance Recommended at Discharge Frequent or constant Supervision/Assistance  Patient can return home with the following A little help with walking and/or transfers;A little help with bathing/dressing/bathroom;Assistance with cooking/housework;Direct supervision/assist for medications management;Assist for transportation;Help with stairs or ramp for entrance   Equipment Recommendations  Cane    Recommendations for Other Services       Precautions / Restrictions Precautions Precautions: Fall Precaution Comments: watch O2 Restrictions Weight Bearing Restrictions: No     Mobility  Bed Mobility Overal bed mobility: Modified Independent              General bed mobility comments: no assist needed    Transfers Overall transfer level: Needs assistance Equipment used: None Transfers: Sit to/from Stand Sit to Stand: Supervision, Min guard           General transfer comment: supervision from EOB    Ambulation/Gait Ambulation/Gait assistance: Min guard Gait Distance (Feet): 300 Feet Assistive device: Straight cane, None Gait Pattern/deviations: Step-through pattern, Decreased stride length, Drifts right/left Gait velocity: decreased     General Gait Details: pt with mild drifting but no overt LOB. using cane at times but carrying it without use for return from stairs.   Stairs Stairs: Yes Stairs assistance: Min guard Stair Management: Two rails, Alternating pattern, Forwards Number of Stairs: 2 (x2 + 5) General stair comments: pt very anxious but able to complete with minG. prefers to use BUE on rail rather than cane      Balance Overall balance assessment: Needs assistance Sitting-balance support: Feet supported, No upper extremity supported Sitting balance-Leahy Scale: Good     Standing balance support: During functional activity, No upper extremity supported, Single extremity supported Standing balance-Leahy Scale: Good Standing balance comment: static stand and walk without UE support, mild instability and poor tolerance with challenge                            Cognition Arousal/Alertness: Awake/alert Behavior During Therapy: Impulsive Overall Cognitive Status: No family/caregiver present to determine baseline cognitive functioning  General Comments: impulsive with decreased safety awareness of lines and room setup. pt following cues in session but needing increased cues for safety, anxious and fidgety when asked to complete task but then often doing more than asked without issue        Exercises      General Comments General comments (skin  integrity, edema, etc.): VSS on 3L O2      Pertinent Vitals/Pain Pain Assessment Pain Assessment: No/denies pain Pain Intervention(s): Monitored during session     PT Goals (current goals can now be found in the care plan section) Acute Rehab PT Goals Patient Stated Goal: go home PT Goal Formulation: With patient Time For Goal Achievement: 07/25/22 Potential to Achieve Goals: Good Progress towards PT goals: Progressing toward goals    Frequency    Min 3X/week      PT Plan Current plan remains appropriate       AM-PAC PT "6 Clicks" Mobility   Outcome Measure  Help needed turning from your back to your side while in a flat bed without using bedrails?: None Help needed moving from lying on your back to sitting on the side of a flat bed without using bedrails?: None Help needed moving to and from a bed to a chair (including a wheelchair)?: A Little Help needed standing up from a chair using your arms (e.g., wheelchair or bedside chair)?: A Little Help needed to walk in hospital room?: A Little Help needed climbing 3-5 steps with a railing? : A Little 6 Click Score: 20    End of Session Equipment Utilized During Treatment: Oxygen Activity Tolerance: Patient tolerated treatment well Patient left: with call bell/phone within reach;in bed Nurse Communication: Mobility status PT Visit Diagnosis: Unsteadiness on feet (R26.81);Difficulty in walking, not elsewhere classified (R26.2)     Time: 0165-5374 PT Time Calculation (min) (ACUTE ONLY): 15 min  Charges:  $Gait Training: 8-22 mins $Therapeutic Exercise: 8-22 mins                     West Carbo, PT, DPT   Acute Rehabilitation Department   Sandra Cockayne 07/15/2022, 4:20 PM

## 2022-07-15 NOTE — Plan of Care (Signed)

## 2022-07-16 ENCOUNTER — Inpatient Hospital Stay (HOSPITAL_COMMUNITY): Payer: Medicare Other

## 2022-07-16 DIAGNOSIS — R609 Edema, unspecified: Secondary | ICD-10-CM | POA: Diagnosis not present

## 2022-07-16 DIAGNOSIS — R0603 Acute respiratory distress: Secondary | ICD-10-CM | POA: Diagnosis not present

## 2022-07-16 MED ORDER — APIXABAN 2.5 MG PO TABS
2.5000 mg | ORAL_TABLET | Freq: Two times a day (BID) | ORAL | Status: DC
  Administered 2022-07-16 – 2022-07-17 (×3): 2.5 mg via ORAL
  Filled 2022-07-16 (×3): qty 1

## 2022-07-16 NOTE — Progress Notes (Signed)
   NAME:  Gail Jennings, MRN:  [id number], DOB:  1933/04/15, LOS: 7 ADMISSION DATE:  07/09/2022, CONSULTATION DATE:  07/09/2022 REFERRING MD:  Lorin Mercy, EDP, CHIEF COMPLAINT:  Shortness of Breath    History of Present Illness:  88 year-old-female with significant past medical history per chart review of paroxysmal Atrial Fib (not on blood thinners) presents via EMS with complaints of worsening shortness of breath, cough w/ white sputum, x2 days following about a week of decreased activity tolerance. Her friend endorses hearing audible wheezing as well. She reports a recent history of nausea, diarrhea, and cough. EMS reported on their arrival she was in A. Fib in the 150s, and was given Cardizem 20mg  and 347mL NS bolus. On evaluation in the ED, she remains short of breath and is now in NSR.    CXR wl large right effusion PCCM asked to eval   Pertinent  Medical History  Paroxsymal A. Fib   Significant Hospital Events: Including procedures, antibiotic start and stop dates in addition to other pertinent events   Right thora 1200 ml out. Sent for analysis 11/4-->exudate 11/5 right chest tube placed  11/8 CT chest shows small right apical pneumothorax, nodular interlobular septal thickening, right lower lobe pleural nodularity and questionable right lower lobe lung mass 11/11-chest x-ray remained stable, stable moderate pneumothorax  Interim History / Subjective:  Denies any significant shortness of breath, no significant chest pain or chest discomfort, does have some leg swelling Objective   Blood pressure (!) 148/69, pulse 80, temperature 98.2 F (36.8 C), temperature source Oral, resp. rate 18, height 5\' 3"  (1.6 m), weight 49 kg, SpO2 97 %.       No intake or output data in the 24 hours ending 07/16/22 0953  Filed Weights   07/09/22 1228 07/09/22 2348  Weight: 49 kg 49 kg    Examination: General: Frail elderly, does not appear to be in distress HEENT: Moist oral mucosa Neuro:  Answers CV: S1-S2 appreciated PULM: Clear breath sounds bilaterally GI: Bowel sounds appreciated Does have some lower extremity edema worse on the right side  Resolved Hospital Problem list     Assessment & Plan:   Acute hypoxic respiratory failure secondary to large right pleural effusion Lymphocytic exudate-positive for adenocarcinoma of lung primary  Right hydropneumothorax which has been stable Was on oxygen supplementation -Was able to discontinue this today and saturations between 93 and 98%  Right lung mass with pleural nodularity -Will benefit from pet scan  Oncology consultation  Vascular ultrasound of lower extremity to rule out DVT  Sherrilyn Rist, MD [place] [facility] Pager: See Shea Evans

## 2022-07-16 NOTE — Progress Notes (Signed)
VASCULAR LAB    Bilateral lower extremity venous duplex has been performed.  See CV proc for preliminary results.   Dontreal Miera, RVT 07/16/2022, 11:31 AM

## 2022-07-16 NOTE — Progress Notes (Signed)
[facility] PROGRESS NOTE  Gail Jennings (DOB: 1932/12/18) YOV:[id number] PCP: Burnard Bunting, MD  Brief Narrative: Gail Jennings is an 88 y.o. female with a history of PAF not on anticoagulation, HTN who presented to the ED on 07/09/2022 with dyspnea, decreased activity tolerance found to be hypoxic with AFib with RVR and large right pleural effusion. Diltiazem bolus administered with conversion to NSR. PCCM consulted, drew 1,200cc pleural fluid found to be exudative, lymphocytic predominant. due to recurrence, chest tube placed 11/5, ultimately removed 11/8. CT chest showed small right apical pneumothorax, nodular interlobular septal thickening, right lower lobe pleural nodularity and questionable right lower lobe lung mass. Cytology initially interpreted as negative for malignancy, but may actually show adenocarcinoma (final report pending).  Subjective: Pt remains very anxious to be discharged. She lives alone and doesn't know that she can navigate the many stairs to get in her home. Her chest pain is better, no shortness of breath at rest, but there is with exertion. Has not had oxygen off yet.  Objective: BP (!) 148/69 (BP Location: Left Arm)   Pulse 80   Temp 98.2 F (36.8 C) (Oral)   Resp 18   Ht 5\' 3"  (1.6 m)   Wt 49 kg   SpO2 97%   BMI 19.13 kg/m   Gen: Elderly, thin/frail female in no distress Pulm: Diminished on right, stable, no wheezes or crackles  CV: RRR, 1+ pitting bilateral LE edema. GI: Soft, NT, ND, +BS  Neuro: Alert and oriented. No new focal deficits. Ext: Warm, no deformities Skin: No rashes, lesions or ulcers on visualized skin   Assessment & Plan: Acute hypoxic respiratory failure: due to right pleural effusion, right lung mass, nodularity on CT. Hypoxia resolved.  - Completed empiric abx. - s/p thoracostomy tube 11/5-11/8. No reinsertion currently planned, pt's symptoms improved, not hypoxemic any longer. Will request ambulatory pulse  oximetry screen.  Lung adenocarcinoma: Cytology erroneously noted to be negative, though this has been amended to convey that there were malignant cells identified as adenocarcinoma +TTF1 consistent with pulmonary primary. This is consistent with R lung mass/nodularity and malignant pleural effusion.  - Discussed with Dr. Alvy Bimler today, will confirm that the patient follows up with Dr. Julien Nordmann, and/or if patient still here Monday, Dr. Julien Nordmann will formally consult at that time.  - PET to be arranged through pulmonary after discharge.    Pneumothorax: post thoracostomy. Stable on my interpretation of CXR, fortunately no respiratory distress at this time.  - Supply supplemental oxygen, stable hydropneumothorax on CXR today. With current clinical stability, there is no plan to reinsert CT, though will require continued inpatient monitoring.  Leg swelling: Bilateral. Inactivity certainly contributing, though patient at risk of VTE with malignancy and anticoagulation had to be held with chest tube/uncertain procedural/biopsy plans. This si more settled.  - LE venous U/S - Restart eliquis for PAF as below.  PAF with RVR: Converted to NSR with diltiazem bolus. Pt not on Tx for this since Dx Dec 2020.  - Continue new low dose metoprolol - Restart eliquis 2.5mg  po BID (dose increase if active clot found)  HTN:  - Hold ARB, started BB as above  Hyponatremia: Stable at 129, asymptomatic.  - Consider further work up if worsens.   Gail Pour, MD [facility] [email or web address] 07/16/2022, 11:22 AM

## 2022-07-16 NOTE — Progress Notes (Signed)
Mobility Specialist - Progress Note   07/16/22 1515  Mobility  Activity Ambulated with assistance in hallway  Level of Assistance Contact guard assist, steadying assist  Assistive Device Other (Comment) (hha)  Distance Ambulated (ft) 450 ft  Activity Response Tolerated well  Mobility Referral Yes  $Mobility charge 1 Mobility   Pt received in bed and agreeable. No complaints throughout. Pt returned to bed with all needs met.  Franki Monte  Mobility Specialist Please contact via Solicitor or Rehab office at [phone]

## 2022-07-16 NOTE — Progress Notes (Signed)
Mobility Specialist - Progress Note   07/16/22 1104  Mobility  Activity Refused mobility   Pt refused mobility d/t wanting leads to be off when she walks. Will follow up.   Franki Monte  Mobility Specialist Please contact via Solicitor or Rehab office at [phone]

## 2022-07-17 DIAGNOSIS — C3491 Malignant neoplasm of unspecified part of right bronchus or lung: Principal | ICD-10-CM

## 2022-07-17 MED ORDER — METOPROLOL TARTRATE 25 MG PO TABS: 12.5000 mg | ORAL_TABLET | Freq: Two times a day (BID) | ORAL | 0 refills | Status: DC

## 2022-07-17 MED ORDER — APIXABAN 2.5 MG PO TABS: 2.5000 mg | ORAL_TABLET | Freq: Two times a day (BID) | ORAL | 0 refills | Status: DC

## 2022-07-17 NOTE — Progress Notes (Signed)
Physical Therapy Treatment Patient Details Name: Gail Jennings MRN: [id number] DOB: 1932/09/20 Today's Date: 07/17/2022   History of Present Illness 88 y.o. female presenting to ED 07/09/22 with 4 days of SOB, wheezing, respiratory distress, cough, diarrhea. Imaging revealed large R lung pleural effusion and pt found to be in afib rate as high as 150s.  s/p 11/4 thoracentesis. 11/5 R chest tube placed. Chest tube removed 11/8. PMH:HTN and atrial fibrillation    PT Comments    Pt is making great progress with mobility, ambulating mod I without UE support and no LOB now. Pt continues to demonstrate some anxiety in regards to navigating stairs here, but verbalizes she will be more comfortable with her own familiar stairs. Educated pt on safely progressing a walking and balance exercise program. She verbalized understanding. Pt is progressing well and getting closer to her baseline, thus updated d/c recs to no PT follow-up as she will likely progress quickly once she returns to working out at her gym. Will continue to follow acutely.     Recommendations for follow up therapy are one component of a multi-disciplinary discharge planning process, led by the attending physician.  Recommendations may be updated based on patient status, additional functional criteria and insurance authorization.  Follow Up Recommendations  No PT follow up     Assistance Recommended at Discharge Intermittent Supervision/Assistance  Patient can return home with the following A little help with bathing/dressing/bathroom;Assistance with cooking/housework;Direct supervision/assist for medications management;Assist for transportation   Equipment Recommendations  None recommended by PT    Recommendations for Other Services       Precautions / Restrictions Precautions Precautions: Fall Precaution Comments: watch O2 Restrictions Weight Bearing Restrictions: No     Mobility  Bed Mobility Overal bed mobility:  Modified Independent             General bed mobility comments: no assist needed    Transfers Overall transfer level: Independent Equipment used: None Transfers: Sit to/from Stand Sit to Stand: Independent           General transfer comment: No assistance needed, no LOB    Ambulation/Gait Ambulation/Gait assistance: Modified independent (Device/Increase time) Gait Distance (Feet): 540 Feet Assistive device: None Gait Pattern/deviations: Step-through pattern, Decreased stride length, Trunk flexed Gait velocity: slightly decreased Gait velocity interpretation: >2.62 ft/sec, indicative of community ambulatory   General Gait Details: Pt with kyphotic posture, ambulating at a steady pace and without LOB, mod I.   Stairs Stairs: Yes Stairs assistance: Min guard Stair Management: Two rails, Alternating pattern, Forwards, Backwards Number of Stairs: 3 General stair comments: Ascends with bil rails, anxious to turn around to descend forwards on stairs so steps down backwards. No LOB, min guard for safety. Pt anxious to navigate up/down further stairs.   Wheelchair Mobility    Modified Rankin (Stroke Patients Only)       Balance Overall balance assessment: Needs assistance Sitting-balance support: Feet supported, No upper extremity supported Sitting balance-Leahy Scale: Good     Standing balance support: During functional activity, No upper extremity supported Standing balance-Leahy Scale: Good Standing balance comment: static stand and walk without UE support, no LOB. Trunk sway and mild instability noted with balance challenges.     Tandem Stance - Right Leg: 3 (seconds)         High Level Balance Comments: guided pt through balance exercises to perform at home like tandem stance, tandem walk forwards and backwards, and standing in SLS while tapping other foot forward, backward, L,  and R - all while keeping a sturdy surface nearby she can grab or hold if  needed for safety            Cognition Arousal/Alertness: Awake/alert Behavior During Therapy: [facility] for tasks assessed/performed Overall Cognitive Status: No family/caregiver present to determine baseline cognitive functioning                                 General Comments: anxious in regards to stairs at times, but able to complete without need for assistance. Pt repeating comments at times. Needs cues to remain on task or education PT is providing intermittently.        Exercises      General Comments General comments (skin integrity, edema, etc.): guided and wrote handout for pt on walking program and balance exercises to perform at home like tandem stance, tandem walk forwards and backwards, and standing in SLS while tapping other foot forward, backward, L, and R - all while keeping a sturdy surface nearby she can grab or hold if needed for safety; educated her how to safely progress her exercises      Pertinent Vitals/Pain Pain Assessment Pain Assessment: Faces Faces Pain Scale: No hurt Pain Intervention(s): Monitored during session    Home Living                          Prior Function            PT Goals (current goals can now be found in the care plan section) Acute Rehab PT Goals Patient Stated Goal: go home PT Goal Formulation: With patient Time For Goal Achievement: 07/25/22 Potential to Achieve Goals: Good Progress towards PT goals: Progressing toward goals    Frequency    Min 3X/week      PT Plan Discharge plan needs to be updated    Co-evaluation              AM-PAC PT "6 Clicks" Mobility   Outcome Measure  Help needed turning from your back to your side while in a flat bed without using bedrails?: None Help needed moving from lying on your back to sitting on the side of a flat bed without using bedrails?: None Help needed moving to and from a bed to a chair (including a wheelchair)?: None Help needed standing  up from a chair using your arms (e.g., wheelchair or bedside chair)?: None Help needed to walk in hospital room?: None Help needed climbing 3-5 steps with a railing? : A Little 6 Click Score: 23    End of Session   Activity Tolerance: Patient tolerated treatment well Patient left: with call bell/phone within reach;in bed Nurse Communication: Mobility status PT Visit Diagnosis: Unsteadiness on feet (R26.81);Difficulty in walking, not elsewhere classified (R26.2)     Time: 2010-0712 PT Time Calculation (min) (ACUTE ONLY): 18 min  Charges:  $Therapeutic Activity: 8-22 mins                     Moishe Spice, PT, DPT Acute Rehabilitation Services  Office: [phone]    Orvan Falconer 07/17/2022, 1:46 PM

## 2022-07-17 NOTE — Plan of Care (Signed)
  Problem: Activity: Goal: Ability to tolerate increased activity will improve Outcome: Adequate for Discharge   Problem: Clinical Measurements: Goal: Ability to maintain a body temperature in the normal range will improve Outcome: Adequate for Discharge   Problem: Respiratory: Goal: Ability to maintain adequate ventilation will improve Outcome: Adequate for Discharge Goal: Ability to maintain a clear airway will improve Outcome: Adequate for Discharge   Problem: Education: Goal: Knowledge of disease or condition will improve Outcome: Adequate for Discharge Goal: Understanding of medication regimen will improve Outcome: Adequate for Discharge Goal: Individualized Educational Video(s) Outcome: Adequate for Discharge   Problem: Activity: Goal: Ability to tolerate increased activity will improve Outcome: Adequate for Discharge   Problem: Cardiac: Goal: Ability to achieve and maintain adequate cardiopulmonary perfusion will improve Outcome: Adequate for Discharge   Problem: Health Behavior/Discharge Planning: Goal: Ability to safely manage health-related needs after discharge will improve Outcome: Adequate for Discharge   Problem: Education: Goal: Knowledge of General Education information will improve Description: Including pain rating scale, medication(s)/side effects and non-pharmacologic comfort measures Outcome: Adequate for Discharge   Problem: Health Behavior/Discharge Planning: Goal: Ability to manage health-related needs will improve Outcome: Adequate for Discharge   Problem: Clinical Measurements: Goal: Ability to maintain clinical measurements within normal limits will improve Outcome: Adequate for Discharge Goal: Will remain free from infection Outcome: Adequate for Discharge Goal: Diagnostic test results will improve Outcome: Adequate for Discharge Goal: Respiratory complications will improve Outcome: Adequate for Discharge Goal: Cardiovascular  complication will be avoided Outcome: Adequate for Discharge   Problem: Activity: Goal: Risk for activity intolerance will decrease Outcome: Adequate for Discharge   Problem: Nutrition: Goal: Adequate nutrition will be maintained Outcome: Adequate for Discharge   Problem: Coping: Goal: Level of anxiety will decrease Outcome: Adequate for Discharge   Problem: Elimination: Goal: Will not experience complications related to bowel motility Outcome: Adequate for Discharge Goal: Will not experience complications related to urinary retention Outcome: Adequate for Discharge   Problem: Pain Managment: Goal: General experience of comfort will improve Outcome: Adequate for Discharge   Problem: Safety: Goal: Ability to remain free from injury will improve Outcome: Adequate for Discharge   Problem: Skin Integrity: Goal: Risk for impaired skin integrity will decrease Outcome: Adequate for Discharge   Problem: Acute Rehab PT Goals(only PT should resolve) Goal: Pt Will Transfer Bed To Chair/Chair To Bed Outcome: Adequate for Discharge Goal: Pt Will Ambulate Outcome: Adequate for Discharge Goal: Pt Will Go Up/Down Stairs Outcome: Adequate for Discharge

## 2022-07-17 NOTE — TOC Initial Note (Signed)
Transition of Care [facility]) - Initial/Assessment Note    Patient Details  Name: Gail Jennings MRN: [id number] Date of Birth: 01-01-1933  Transition of Care [facility]) CM/SW Contact:    Caesar Mannella G., RN Phone Number: 07/17/2022, 10:17 AM  Clinical Narrative:                              Gail Jennings is an 88 y.o. female with a history of PAF not on anticoagulation, HTN who presented to the ED on 07/09/2022 with dyspnea, decreased activity tolerance found to be hypoxic with AFib with RVR and large right pleural effusion. Diltiazem bolus administered with conversion to NSR. PCCM consulted, drew 1,200cc pleural fluid found to be exudative, lymphocytic predominant. due to recurrence, chest tube placed 11/5, ultimately removed 11/8. CT chest showed small right apical pneumothorax, nodular interlobular septal thickening, right lower lobe pleural nodularity and questionable right lower lobe lung mass. Cytology initially interpreted as negative for malignancy, but may actually show adenocarcinoma (final report pending).   RNCM spoke to patient.  Patient declines [facility] services at this time.  Patient states she wants to think about it once she gets home and she will notify her provider if she decides she wants HHPT.  Expected Discharge Plan: Home/Self Care Barriers to Discharge: No Barriers Identified  Patient Goals and CMS Choice Patient states their goals for this hospitalization and ongoing recovery are:: plan to transition home and get back to the gym.   Choice offered to / list presented to : Patient  Expected Discharge Plan and Services Expected Discharge Plan: Home/Self Care In-house Referral: NA Discharge Planning Services: CM Consult Post Acute Care Choice: NA (patient declined services) Living arrangements for the past 2 months: Single Family Home Expected Discharge Date: 07/17/22               DME Arranged: N/A DME Agency: NA       HH Arranged: NA [place] Agency: NA        Prior Living Arrangements/Services Living arrangements for the past 2 months: Single Family Home Lives with:: Self Patient language and need for interpreter reviewed:: Yes Do you feel safe going back to the place where you live?: Yes      Need for Family Participation in Patient Care: No (Comment) Care giver support system in place?: Yes (comment)   Criminal Activity/Legal Involvement Pertinent to Current Situation/Hospitalization: No - Comment as needed  Activities of Daily Living     Permission Sought/Granted                Emotional Assessment Appearance:: Appears stated age Attitude/Demeanor/Rapport: Engaged Affect (typically observed): Appropriate Orientation: : Oriented to Self, Oriented to Place, Oriented to  Time, Oriented to Situation Alcohol / Substance Use: Not Applicable Psych Involvement: No (comment)  Admission diagnosis:  Shortness of breath [R06.02] Hyponatremia [E87.1] Pleural effusion on right [J90] Acute respiratory failure with hypoxia (HCC) [J96.01] Atrial fibrillation with RVR ([place]) [I48.91] Community acquired pneumonia of right lung, unspecified part of lung [J18.9] Patient Active Problem List   Diagnosis Date Noted   Primary lung adenocarcinoma, right ([place]) 07/17/2022   Atrial fibrillation with RVR ([place]) 07/09/2022   Respiratory distress 07/09/2022   Pleural effusion on right 07/09/2022   Essential hypertension 07/09/2022   Chronic hyponatremia 07/09/2022   Paroxysmal A-fib ([place])    History of left indirect inguinal hernia and lap repair left femoral hernia 11/22/2012   PCP:  Burnard Bunting,  MD Pharmacy:   [facility] [address] [place] [address] Phone: [phone] Fax: [phone]  [id number] - [place], [place] - [address] AT [place] [place] [address] Phone: [phone] Fax: [phone]  [facility] [place], [place] [address] Phone: [phone] Fax: [phone]  [facility] [address] [place] [address] Phone: [phone] Fax: [phone]  Social Determinants of Health (SDOH) Interventions   Readmission Risk Interventions     No data to display

## 2022-07-17 NOTE — Discharge Summary (Addendum)
Physician Discharge Summary   Patient: Gail Jennings MRN: [id number] DOB: 24-Dec-1932  Admit date:     07/09/2022  Discharge date: 07/17/22  Discharge Physician: [name]   PCP: Burnard Bunting, MD   Recommendations at discharge:  Follow up with PCP in 1-2 weeks, suggest recheck BMP for chronic hyponatremia.  Follow up in AFib clinic (referral placed). Discharged on metoprolol, eliquis. Follow up with Dr. Julien Nordmann after results of PET scan available. PET scan arranged for 07/29/2022.   Discharge Diagnoses: Principal Problem:   Respiratory distress Active Problems:   Atrial fibrillation with RVR (HCC)   Pleural effusion on right   Essential hypertension   Chronic hyponatremia   Primary lung adenocarcinoma, right [facility])  Hospital Course: Gail Jennings is an 88 y.o. female with a history of PAF not on anticoagulation, HTN who presented to the ED on 07/09/2022 with dyspnea, decreased activity tolerance found to be hypoxic with AFib with RVR and large right pleural effusion. Diltiazem bolus administered with conversion to NSR. PCCM consulted, drew 1,200cc pleural fluid found to be exudative, lymphocytic predominant. due to recurrence, chest tube placed 11/5, ultimately removed 11/8. CT chest showed small right apical pneumothorax, nodular interlobular septal thickening, right lower lobe pleural nodularity and questionable right lower lobe lung mass. Cytology initially interpreted as negative for malignancy, but on review is confirmed to show adenocarcinoma with +TTF1 staining consistent with lung primary. Oncology was consulted, recommends outpatient follow up after PET scan is completed, with no further recommendations for inpatient management/work up. Pulmonology has arranged for PET scan on 11/24. The patient's exam and CXR remain consistent with stable hydropneumothorax. She has no hypoxemia or dyspnea with exertion, so reinsertion of chest tube is planned and the patient has been  cleared clinically for discharge.  Assessment and Plan: Acute hypoxic respiratory failure: due to right pleural effusion, right lung mass, nodularity on CT. Hypoxia resolved.  - Completed empiric abx. - s/p thoracostomy tube 11/5-11/8. No reinsertion currently planned, pt's symptoms improved, not hypoxemic any longer even with exertion.   Lung adenocarcinoma: Cytology erroneously initially noted to be negative, though this has been amended to convey that there were malignant cells identified as adenocarcinoma +TTF1 consistent with pulmonary primary. This is consistent with R lung mass/nodularity and malignant pleural effusion.  - Discussed with Dr. Alvy Bimler who has alerted Dr. Julien Nordmann to the patient who will require follow up. The patient has no need to remain in the hospital at this time and will have expedited follow up arranged at the [facility] after discharge. - PET has been arranged by pulmonology for 11/24.   Pneumothorax: Post thoracostomy. Stable on serial exams. No thoracostomy planned.   Leg swelling: Bilateral though R > L, nontender, negative Homan's, and no DVT on venous U/S. Improved on the morning of discharge. Echo had preserved LVEF and normal RVSF. Hypoproteinemia predisposing to edema, though inactivity is the primary suspected driver.  - Continue monitoring, can apply TED hose and elevate. - Continue eliquis at AFib dose as below.   PAF with RVR: Converted to NSR with diltiazem bolus. Pt not on Tx for this since Dx Dec 2020.  - Continue new low dose metoprolol - Started eliquis 2.5mg  po BID    HTN:  - Hold ARB at discharge as she's normotensive after BB started as above.   Hyponatremia: Stable at 129, asymptomatic.  - Consider further work up if worsens.   Consultants: Pulmonary, Dr. Elsworth Soho. Oncology Dr. Alvy Bimler Procedures performed: Thoracentesis 11/4, thoracostomy/chest  tube insertion 11/5 Disposition: Home Diet recommendation:  Cardiac diet DISCHARGE  MEDICATION: Allergies as of 07/17/2022       Reactions   Codeine Nausea And Vomiting   Hydrocodone Nausea And Vomiting   Succinylcholine Chloride    Prolonged paralysis        Medication List     STOP taking these medications    olmesartan 20 MG tablet Commonly known as: BENICAR       TAKE these medications    apixaban 2.5 MG Tabs tablet Commonly known as: ELIQUIS Take 1 tablet (2.5 mg total) by mouth 2 (two) times daily.   metoprolol tartrate 25 MG tablet Commonly known as: LOPRESSOR Take 0.5 tablets (12.5 mg total) by mouth 2 (two) times daily.        Follow-up Information     Burnard Bunting, MD. Schedule an appointment as soon as possible for a visit.   Specialty: Internal Medicine Contact information: [place] [address] [phone]                Discharge Exam: Danley Danker Weights   07/09/22 1228 07/09/22 2348  Weight: 49 kg 49 kg  Elderly, thin but active, strong female in no distress Clear on left, no crackles or wheezes on right but is diminished RRR, R > L nontender leg swelling slightly improved from yesterday. shallow abrasions on right posterior back without open wound or spreading erythema or discharge.  Condition at discharge: stable  The results of significant diagnostics from this hospitalization (including imaging, microbiology, ancillary and laboratory) are listed below for reference.   Imaging Studies: VAS [place] LOWER EXTREMITY VENOUS (DVT)  Result Date: 07/16/2022  Lower Venous DVT Study Patient Name:  Gail Jennings  Date of Exam:   07/16/2022 Medical Rec #: [id number]           Accession #:    [id number] Date of Birth: 09/14/1932          Patient Gender: F Patient Age:   69 years Exam Location:  [address] [facility] Procedure:      VAS [place] LOWER EXTREMITY VENOUS (DVT) Referring Phys: Vance Gather --------------------------------------------------------------------------------  Indications: Edema.  Risk Factors:  Atrial fibrillation. Comparison Study: No prior Performing Technologist: Sharion Dove RVS  Examination Guidelines: A complete evaluation includes B-mode imaging, spectral Doppler, color Doppler, and power Doppler as needed of all accessible portions of each vessel. Bilateral testing is considered an integral part of a complete examination. Limited examinations for reoccurring indications may be performed as noted. The reflux portion of the exam is performed with the patient in reverse Trendelenburg.  +--------+---------------+---------+-----------+----------------+-------------+ RIGHT   CompressibilityPhasicitySpontaneityProperties      Thrombus                                                                 Aging         +--------+---------------+---------+-----------+----------------+-------------+ CFV     Full                               pulsatile  waveforms                     +--------+---------------+---------+-----------+----------------+-------------+ SFJ     Full                                                             +--------+---------------+---------+-----------+----------------+-------------+ FV Prox Full                                                             +--------+---------------+---------+-----------+----------------+-------------+ FV Mid  Full                                                             +--------+---------------+---------+-----------+----------------+-------------+ FV      Full                                                             Distal                                                                   +--------+---------------+---------+-----------+----------------+-------------+ PFV     Full                                                             +--------+---------------+---------+-----------+----------------+-------------+  POP     Full                               pulsatile                                                                waveforms                     +--------+---------------+---------+-----------+----------------+-------------+ PTV     Full                                                             +--------+---------------+---------+-----------+----------------+-------------+  PERO    Full                                                             +--------+---------------+---------+-----------+----------------+-------------+   +--------+---------------+---------+-----------+----------------+-------------+ LEFT    CompressibilityPhasicitySpontaneityProperties      Thrombus                                                                 Aging         +--------+---------------+---------+-----------+----------------+-------------+ CFV     Full                               pulsatile                                                                waveforms                     +--------+---------------+---------+-----------+----------------+-------------+ SFJ     Full                                                             +--------+---------------+---------+-----------+----------------+-------------+ FV Prox Full                                                             +--------+---------------+---------+-----------+----------------+-------------+ FV Mid  Full                                                             +--------+---------------+---------+-----------+----------------+-------------+ FV      Full                                                             Distal                                                                   +--------+---------------+---------+-----------+----------------+-------------+  PFV     Full                                                              +--------+---------------+---------+-----------+----------------+-------------+ POP     Full                               pulsatile                                                                waveforms                     +--------+---------------+---------+-----------+----------------+-------------+ PTV     Full                                                             +--------+---------------+---------+-----------+----------------+-------------+ PERO    Full                                                             +--------+---------------+---------+-----------+----------------+-------------+     Summary: BILATERAL: - No evidence of deep vein thrombosis seen in the lower extremities, bilaterally. -No evidence of popliteal cyst, bilaterally. RIGHT: pulsatile waveforms  LEFT: Pulsatile waveforms.  *See table(s) above for measurements and observations. Electronically signed by Monica Martinez MD on 07/16/2022 at 1:14:13 PM.    Final    DG Chest Port 1 View  Result Date: 07/16/2022 CLINICAL DATA:  [id number] Pneumohemothorax [id number] EXAM: PORTABLE CHEST 1 VIEW COMPARISON:  07/15/2022 FINDINGS: Moderate right pneumothorax again noted, stable. Right basilar opacity with layering right effusion. Left lung clear. Heart is normal size. Aortic atherosclerosis. No acute bony abnormality. IMPRESSION: Stable moderate-sized right pneumothorax. Right effusion with right basilar atelectasis or infiltrate. No real change. Electronically Signed   By: Rolm Baptise M.D.   On: 07/16/2022 08:26   DG CHEST PORT 1 VIEW  Result Date: 07/15/2022 CLINICAL DATA:  Pleural effusion. EXAM: PORTABLE CHEST 1 VIEW COMPARISON:  July 14, 2022. FINDINGS: The heart size and mediastinal contours are within normal limits. Left lung is clear. Stable right basilar atelectasis or infiltrate is noted with associated pleural effusion. Grossly stable right apical pneumothorax. The visualized skeletal structures  are unremarkable. IMPRESSION: Stable right basilar opacity as described above. Stable moderate size right apical pneumothorax. Electronically Signed   By: Marijo Conception M.D.   On: 07/15/2022 08:42   DG Chest 1 View  Result Date: 07/14/2022 CLINICAL DATA:  Follow-up pneumothorax. EXAM: CHEST  1 VIEW COMPARISON:  07/14/2022 at 6:18 a.m. FINDINGS: Unchanged appearance of right-sided pneumothorax. This measures 3.3 cm over the right apex. Previously this measured 4.0 cm.  Decreased aeration to the right lung base compatible with atelectasis. Unchanged small right pleural effusion. IMPRESSION: 1. Similar size of moderate right-sided pneumothorax 2. Persistent small right pleural effusion and right lower lobe atelectasis. Electronically Signed   By: Kerby Moors M.D.   On: 07/14/2022 13:14   DG Chest Port 1 View  Result Date: 07/14/2022 CLINICAL DATA:  88 year old female with pneumothorax following thoracentesis for large right pleural effusion. Chest tube removed yesterday. EXAM: PORTABLE CHEST 1 VIEW COMPARISON:  Portable chest 07/13/2022 and earlier. FINDINGS: Portable AP upright view at 0618 hours. Stable up to moderate size right pneumothorax since yesterday following chest tube removal. Fairly small volume residual layering right pleural effusion does not appear significantly changed. Some lower lung atelectasis. Stable cardiac size and mediastinal contours. Left lung appears stable and negative. Stable visualized osseous structures. Negative visible bowel gas. IMPRESSION: 1. Stable up to moderate size right pneumothorax since yesterday after chest tube removal. Small volume residual right pleural effusion. 2. No new cardiopulmonary abnormality. Electronically Signed   By: Genevie Ann M.D.   On: 07/14/2022 08:31   DG CHEST PORT 1 VIEW  Result Date: 07/13/2022 CLINICAL DATA:  Chest tube removal. EXAM: PORTABLE CHEST 1 VIEW COMPARISON:  Radiograph earlier today FINDINGS: Removal of right pigtail catheter.  The right pneumothorax has increased in size from prior exam, currently 3.4 cm when measured from under the right first rib, previously 1.3 cm. There is likely a basilar component that is also new. There is increasing opacity at the right lung base. Stable heart size and mediastinal contours. No acute findings in the left lung. IMPRESSION: 1. Increased size of right pneumothorax after removal of right pigtail catheter, small to moderate in size. 2. Increasing opacity at the right lung base. These results will be called to the ordering clinician or representative by the Radiologist Assistant, and communication documented in the PACS or [employer]. Electronically Signed   By: Keith Rake M.D.   On: 07/13/2022 20:06   DG Chest Port 1 View  Result Date: 07/13/2022 CLINICAL DATA:  Follow-up chest tube placement EXAM: PORTABLE CHEST 1 VIEW COMPARISON:  07/12/2022 FINDINGS: Unchanged right-sided pigtail thoracostomy tube. Small right apical pneumothorax appears unchanged. Stable cardiomediastinal contour. Asymmetric opacity within the right base is unchanged from previous exam. Left lung clear. IMPRESSION: 1. Stable position of right sided thoracostomy tube with persistent small right apical pneumothorax. 2. No change in right lower lung opacity. Electronically Signed   By: Kerby Moors M.D.   On: 07/13/2022 08:47   CT CHEST WO CONTRAST  Result Date: 07/12/2022 CLINICAL DATA:  Pleural effusion, malignancy suspected EXAM: CT CHEST WITHOUT CONTRAST TECHNIQUE: Multidetector CT imaging of the chest was performed following the standard protocol without IV contrast. RADIATION DOSE REDUCTION: This exam was performed according to the departmental dose-optimization program which includes automated exposure control, adjustment of the mA and/or kV according to patient size and/or use of iterative reconstruction technique. COMPARISON:  Radiographs earlier today FINDINGS: Cardiovascular: Aortic and coronary artery  atherosclerotic calcification. Normal heart size. No pericardial effusion. Mediastinum/Nodes: No thoracic adenopathy by size noting limitations of noncontrast exam. 1.1 cm right thyroid nodule. No follow-up recommended. Mildly patulous esophagus with some frothy fluid reaching the thoracic inlet. Lungs/Pleura: Persistent right apical and basilar small volume pneumothorax. Right pleural pigtail catheter along the right minor fissure. Small left pleural effusion. Nodular thickening of the posterior right pleura with focal masslike airspace opacity measuring 3.7 by 2.3 cm. Nodular consolidation along the right minor  fissure measuring 3.9 x 1.7 cm. Scattered nodular interlobular septal thickening throughout the right lung. Patchy ground-glass opacities in the right upper and middle lobe. The left lung is clear. 3 mm pulmonary nodule in the lateral left upper lobe (series 4/image 57). Upper Abdomen: Low-density fluid collection posterior to the right hepatic lobe measures 3.8 x 2.4 cm. Musculoskeletal: No chest wall mass or evidence of osseous metastases. Subcutaneous gas along the right lower posterior chest wall. IMPRESSION: Persistent right apical and basilar pneumothorax despite thoracostomy tube. Nodular interlobular septal thickening with more focal areas of nodular consolidation along the right minor fissure and posterior right lower lobe. Findings are concerning for malignancy with lymphangitic spread. Small right pleural effusion. Possible loculated subpulmonic effusion versus right lower lobe mass along the posterior right lower lobe. Indeterminate low-density fluid collection posterior to the right hepatic lobe. Electronically Signed   By: Placido Sou M.D.   On: 07/12/2022 21:59   DG CHEST PORT 1 VIEW  Result Date: 07/12/2022 CLINICAL DATA:  RIGHT pleural effusion EXAM: PORTABLE CHEST 1 VIEW COMPARISON:  Portable exam 0633 hours compared to 07/11/2022 FINDINGS: Pigtail RIGHT thoracostomy tube  unchanged. Normal heart size, mediastinal contours, and pulmonary vascularity. Aorta Persistent small RIGHT apex pneumothorax despite thoracostomy tube. Associated mild RIGHT basilar atelectasis and questionable small sub pulmonic effusion. No acute infiltrate or acute osseous findings. IMPRESSION: Persistent small RIGHT apex pneumothorax and RIGHT basilar atelectasis with questionable small subpulmonic RIGHT pleural effusion. Electronically Signed   By: Lavonia Dana M.D.   On: 07/12/2022 10:01   DG Chest Port 1 View  Result Date: 07/11/2022 CLINICAL DATA:  Follow-up chest 2 EXAM: PORTABLE CHEST 1 VIEW COMPARISON:  07/10/2022 FINDINGS: Right chest tube remains in place. Small amount of pleural air at the apex persists, less than 5%. Some atelectasis persists in the right lower lung. Chronic aortic atherosclerosis. No new finding. IMPRESSION: No significant change. Right chest tube remains in place. Small amount of pleural air at the apex persists, less than 5%. Electronically Signed   By: Nelson Chimes M.D.   On: 07/11/2022 08:18   DG Chest Port 1 View  Result Date: 07/10/2022 CLINICAL DATA:  Right pleural effusion, placement of right chest tube EXAM: PORTABLE CHEST 1 VIEW COMPARISON:  Previous studies including the examination done earlier today FINDINGS: Transverse diameter of heart is slightly increased. Thoracic aorta is tortuous and ectatic. There are no signs of pulmonary edema. There is improvement in aeration of right mid and right lower lung fields after placement of right chest tube suggesting significant interval decrease in right pleural effusion. There is small right apical pneumothorax measuring 5 mm in diameter. Right lung remains well expanded. Residual increased markings are seen in right lower lung field. IMPRESSION: There is marked interval decrease in right pleural effusion after placement of right chest tube. Small right apical pneumothorax is seen. Electronically Signed   By: Elmer Picker M.D.   On: 07/10/2022 17:25   ECHOCARDIOGRAM COMPLETE  Result Date: 07/10/2022    ECHOCARDIOGRAM REPORT   Patient Name:   MATTILYN CRITES Date of Exam: 07/10/2022 Medical Rec #:  [id number]          Height:       63.0 in Accession #:    [id number]         Weight:       108.0 lb Date of Birth:  05-08-1933         BSA:  1.488 m Patient Age:    63 years           BP:           110/64 mmHg Patient Gender: F                  HR:           80 bpm. Exam Location:  Inpatient Procedure: 2D Echo, Cardiac Doppler and Color Doppler Indications:    atrial fibrillation  History:        Patient has prior history of Echocardiogram examinations, most                 recent 08/28/2019. Risk Factors:Hypertension.  Sonographer:    Johny Chess RDCS Referring Phys: [place]  1. Left ventricular ejection fraction, by estimation, is 60 to 65%. The left ventricle has normal function. The left ventricle has no regional wall motion abnormalities. There is mild asymmetric left ventricular hypertrophy of the basal-septal segment. Left ventricular diastolic parameters are indeterminate.  2. Right ventricular systolic function is normal. The right ventricular size is normal. There is normal pulmonary artery systolic pressure. The estimated right ventricular systolic pressure is 31.5 mmHg.  3. The mitral valve is normal in structure. Trivial mitral valve regurgitation. No evidence of mitral stenosis.  4. Tricuspid valve regurgitation is mild to moderate.  5. The aortic valve was not well visualized. Aortic valve regurgitation is not visualized. Aortic valve sclerosis/calcification is present, without any evidence of aortic stenosis.  6. The inferior vena cava is normal in size with greater than 50% respiratory variability, suggesting right atrial pressure of 3 mmHg. FINDINGS  Left Ventricle: Left ventricular ejection fraction, by estimation, is 60 to 65%. The left ventricle has normal  function. The left ventricle has no regional wall motion abnormalities. The left ventricular internal cavity size was normal in size. There is  mild asymmetric left ventricular hypertrophy of the basal-septal segment. Left ventricular diastolic parameters are indeterminate. Right Ventricle: The right ventricular size is normal. No increase in right ventricular wall thickness. Right ventricular systolic function is normal. There is normal pulmonary artery systolic pressure. The tricuspid regurgitant velocity is 2.73 m/s, and  with an assumed right atrial pressure of 3 mmHg, the estimated right ventricular systolic pressure is 40.0 mmHg. Left Atrium: Left atrial size was normal in size. Right Atrium: Right atrial size was normal in size. Pericardium: There is no evidence of pericardial effusion. Mitral Valve: The mitral valve is normal in structure. Trivial mitral valve regurgitation. No evidence of mitral valve stenosis. Tricuspid Valve: The tricuspid valve is normal in structure. Tricuspid valve regurgitation is mild to moderate. Aortic Valve: The aortic valve was not well visualized. Aortic valve regurgitation is not visualized. Aortic valve sclerosis/calcification is present, without any evidence of aortic stenosis. Pulmonic Valve: The pulmonic valve was not well visualized. Pulmonic valve regurgitation is not visualized. Aorta: The aortic root and ascending aorta are structurally normal, with no evidence of dilitation. Venous: The inferior vena cava is normal in size with greater than 50% respiratory variability, suggesting right atrial pressure of 3 mmHg. IAS/Shunts: The interatrial septum was not well visualized.  LEFT VENTRICLE PLAX 2D LVIDd:         3.90 cm   Diastology LVIDs:         2.30 cm   LV e' medial:    7.18 cm/s LV PW:         0.80 cm   LV E/e'  medial:  10.9 LV IVS:        1.00 cm   LV e' lateral:   7.40 cm/s LVOT diam:     1.90 cm   LV E/e' lateral: 10.6 LV SV:         60 LV SV Index:   41 LVOT  Area:     2.84 cm  RIGHT VENTRICLE             IVC RV S prime:     19.30 cm/s  IVC diam: 0.50 cm TAPSE (M-mode): 2.5 cm LEFT ATRIUM           Index        RIGHT ATRIUM           Index LA diam:      3.30 cm 2.22 cm/m   RA Area:     12.20 cm LA Vol (A2C): 25.4 ml 17.07 ml/m  RA Volume:   29.10 ml  19.55 ml/m LA Vol (A4C): 50.7 ml 34.07 ml/m  AORTIC VALVE LVOT Vmax:   113.00 cm/s LVOT Vmean:  75.100 cm/s LVOT VTI:    0.213 m  AORTA Ao Root diam: 3.20 cm Ao Asc diam:  3.40 cm MITRAL VALVE               TRICUSPID VALVE MV Area (PHT): 3.65 cm    TR Peak grad:   29.8 mmHg MV Decel Time: 208 msec    TR Vmax:        273.00 cm/s MV E velocity: 78.50 cm/s MV A velocity: 55.50 cm/s  SHUNTS MV E/A ratio:  1.41        Systemic VTI:  0.21 m                            Systemic Diam: 1.90 cm Oswaldo Milian MD Electronically signed by Oswaldo Milian MD Signature Date/Time: 07/10/2022/1:31:43 PM    Final    DG Chest Port 1 View  Result Date: 07/10/2022 CLINICAL DATA:  [id number] with right pleural effusion. EXAM: PORTABLE CHEST 1 VIEW COMPARISON:  Portable chest yesterday following right thoracentesis at 5:55 p.m., PA Lat yesterday before the thoracentesis at 1:57 p.m. FINDINGS: Moderate right pleural effusion continues to be seen. The volume may have decreased in the interval since the last film or redistributed as there is partial reaeration of the right lower lung field in the interval. No pneumothorax is seen. The right upper and left lung fields are clear. There is mild cardiomegaly with normal caliber central vessels. The aorta is tortuous with patchy calcification. Stable mediastinum. Osteopenia. IMPRESSION: Moderate right pleural effusion continues to be seen. The volume may have decreased in the interval since the last film or redistributed as there is partial reaeration of the right lower lung field. In all other respects no further changes are seen.  No pneumothorax. Electronically Signed   By: Telford Nab M.D.   On: 07/10/2022 06:33   DG CHEST PORT 1 VIEW  Result Date: 07/09/2022 CLINICAL DATA:  Status post thoracentesis. EXAM: PORTABLE CHEST 1 VIEW COMPARISON:  Radiograph earlier today. FINDINGS: Slight decreased size of right pleural effusion post thoracentesis. No pneumothorax. Persistent opacification of the lower 2/3 of right hemithorax likely represents a combination of pleural fluid and airspace disease. The left lung appears clear. Heart size is obscured. IMPRESSION: 1. Slight decreased size of right pleural effusion post thoracentesis. No pneumothorax. 2. Persistent opacification of the lower 2/3  of right hemithorax likely represents a combination of residual pleural fluid and airspace disease. Electronically Signed   By: Keith Rake M.D.   On: 07/09/2022 18:07   DG Chest 2 View  Result Date: 07/09/2022 CLINICAL DATA:  Shortness of breath and wheezing. EXAM: CHEST - 2 VIEW COMPARISON:  08/16/2019 chest radiograph FINDINGS: A new large RIGHT pleural effusion is noted with probable underlying consolidation or atelectasis. The LEFT lung is clear. The visualized portions of the cardiomediastinal silhouette are stable. There is no evidence of pneumothorax or acute bony abnormality. IMPRESSION: New large RIGHT pleural effusion with probable underlying consolidation or atelectasis. Electronically Signed   By: Margarette Canada M.D.   On: 07/09/2022 14:27    Microbiology: Results for orders placed or performed during the hospital encounter of 07/09/22  Resp Panel by RT-PCR (Flu A&B, Covid) Anterior Nasal Swab     Status: None   Collection Time: 07/09/22  4:41 PM   Specimen: Anterior Nasal Swab  Result Value Ref Range Status   SARS Coronavirus 2 by RT PCR NEGATIVE NEGATIVE Final    Comment: (NOTE) SARS-CoV-2 target nucleic acids are NOT DETECTED.  The SARS-CoV-2 RNA is generally detectable in upper respiratory specimens during the acute phase of infection. The lowest concentration of  SARS-CoV-2 viral copies this assay can detect is 138 copies/mL. A negative result does not preclude SARS-Cov-2 infection and should not be used as the sole basis for treatment or other patient management decisions. A negative result may occur with  improper specimen collection/handling, submission of specimen other than nasopharyngeal swab, presence of viral mutation(s) within the areas targeted by this assay, and inadequate number of viral copies(<138 copies/mL). A negative result must be combined with clinical observations, patient history, and epidemiological information. The expected result is Negative.  Fact Sheet for Patients:  EntrepreneurPulse.com.au  Fact Sheet for Healthcare Providers:  IncredibleEmployment.be  This test is no t yet approved or cleared by the [place] FDA and  has been authorized for detection and/or diagnosis of SARS-CoV-2 by FDA under an Emergency Use Authorization (EUA). This EUA will remain  in effect (meaning this test can be used) for the duration of the COVID-19 declaration under Section 564(b)(1) of the Act, 21 U.S.C.section 360bbb-3(b)(1), unless the authorization is terminated  or revoked sooner.       Influenza A by PCR NEGATIVE NEGATIVE Final   Influenza B by PCR NEGATIVE NEGATIVE Final    Comment: (NOTE) The Xpert Xpress SARS-CoV-2/FLU/RSV plus assay is intended as an aid in the diagnosis of influenza from Nasopharyngeal swab specimens and should not be used as a sole basis for treatment. Nasal washings and aspirates are unacceptable for Xpert Xpress SARS-CoV-2/FLU/RSV testing.  Fact Sheet for Patients: EntrepreneurPulse.com.au  Fact Sheet for Healthcare Providers: IncredibleEmployment.be  This test is not yet approved or cleared by the [place] FDA and has been authorized for detection and/or diagnosis of SARS-CoV-2 by FDA under an Emergency Use  Authorization (EUA). This EUA will remain in effect (meaning this test can be used) for the duration of the COVID-19 declaration under Section 564(b)(1) of the Act, 21 U.S.C. section 360bbb-3(b)(1), unless the authorization is terminated or revoked.  Performed at [facility], [place] [address] [place],  [address]   Body fluid culture w Gram Stain     Status: None   Collection Time: 07/09/22  5:24 PM   Specimen: Thoracentesis; Body Fluid  Result Value Ref Range Status   Specimen Description THORACENTESIS  Final  Special Requests NONE  Final   Gram Stain   Final    RARE WBC PRESENT, PREDOMINANTLY PMN NO ORGANISMS SEEN    Culture   Final    NO GROWTH Performed at [facility], [place] [address] [place], [place] [address]    Report Status 07/13/2022 FINAL  Final  Blood culture (routine x 2)     Status: None   Collection Time: 07/09/22  8:25 PM   Specimen: BLOOD RIGHT FOREARM  Result Value Ref Range Status   Specimen Description BLOOD RIGHT FOREARM  Final   Special Requests   Final    BOTTLES DRAWN AEROBIC AND ANAEROBIC Blood Culture results may not be optimal due to an inadequate volume of blood received in culture bottles   Culture   Final    NO GROWTH 5 DAYS Performed at [facility], [place] [address] [place], [place] [address]    Report Status 07/14/2022 FINAL  Final    Labs: CBC: Recent Labs  Lab 07/11/22 0216 07/12/22 0229 07/13/22 0302  WBC 12.3* 10.3 10.4  HGB 13.4 11.7* 11.9*  HCT 40.0 33.3* 33.7*  MCV 98.8 94.6 95.5  PLT 365 331 469   Basic Metabolic Panel: Recent Labs  Lab 07/11/22 0216 07/12/22 0229 07/13/22 0302  NA 132* 128* 129*  K 4.7 3.9 3.8  CL 99 99 99  CO2 23 22 22   GLUCOSE 115* 105* 111*  BUN 17 19 14   CREATININE 0.59 0.58 0.54  CALCIUM 8.6* 8.0* 8.5*   Liver Function Tests: No results for input(s): "AST", "ALT", "ALKPHOS", "BILITOT", "PROT", "ALBUMIN" in the last 168 hours. CBG: No results for input(s):  "GLUCAP" in the last 168 hours.  Discharge time spent: greater than 30 minutes.  Signed: [name], MD [facility] 07/17/2022

## 2022-07-19 ENCOUNTER — Telehealth: Payer: Self-pay | Admitting: Internal Medicine

## 2022-07-19 NOTE — Telephone Encounter (Signed)
Scheduled appointment per navigator. Patient is aware of appointment date and time. Patient is aware to arrive 15 mins prior to appointment time and to bring updated insurance cards. Patient is aware of location.

## 2022-07-20 ENCOUNTER — Telehealth: Payer: Self-pay | Admitting: Internal Medicine

## 2022-07-20 NOTE — Telephone Encounter (Signed)
Scheduled appointment per 11/10 referral. Patient is aware of appointment date and time. Patient is aware to arrive 15 mins prior to appointment time and to bring updated insurance cards. Patient is aware of location.

## 2022-07-25 ENCOUNTER — Other Ambulatory Visit: Payer: Self-pay | Admitting: Internal Medicine

## 2022-07-25 DIAGNOSIS — M7989 Other specified soft tissue disorders: Secondary | ICD-10-CM | POA: Diagnosis not present

## 2022-07-25 DIAGNOSIS — I700 Atherosclerosis of aorta: Secondary | ICD-10-CM | POA: Diagnosis not present

## 2022-07-25 DIAGNOSIS — S270XXD Traumatic pneumothorax, subsequent encounter: Secondary | ICD-10-CM | POA: Diagnosis not present

## 2022-07-25 DIAGNOSIS — J9601 Acute respiratory failure with hypoxia: Secondary | ICD-10-CM

## 2022-07-25 DIAGNOSIS — I4891 Unspecified atrial fibrillation: Secondary | ICD-10-CM | POA: Diagnosis not present

## 2022-07-25 DIAGNOSIS — R0600 Dyspnea, unspecified: Secondary | ICD-10-CM | POA: Diagnosis not present

## 2022-07-25 DIAGNOSIS — C3491 Malignant neoplasm of unspecified part of right bronchus or lung: Secondary | ICD-10-CM | POA: Diagnosis not present

## 2022-07-25 DIAGNOSIS — J90 Pleural effusion, not elsewhere classified: Secondary | ICD-10-CM | POA: Diagnosis not present

## 2022-07-25 DIAGNOSIS — I10 Essential (primary) hypertension: Secondary | ICD-10-CM | POA: Diagnosis not present

## 2022-07-25 DIAGNOSIS — I480 Paroxysmal atrial fibrillation: Secondary | ICD-10-CM | POA: Diagnosis not present

## 2022-07-25 DIAGNOSIS — I499 Cardiac arrhythmia, unspecified: Secondary | ICD-10-CM | POA: Diagnosis not present

## 2022-07-25 DIAGNOSIS — E871 Hypo-osmolality and hyponatremia: Secondary | ICD-10-CM | POA: Diagnosis not present

## 2022-07-27 ENCOUNTER — Other Ambulatory Visit: Payer: Medicare Other

## 2022-07-27 ENCOUNTER — Ambulatory Visit: Payer: Medicare Other | Admitting: Internal Medicine

## 2022-07-27 ENCOUNTER — Ambulatory Visit (HOSPITAL_COMMUNITY)
Admission: RE | Admit: 2022-07-27 | Discharge: 2022-07-27 | Disposition: A | Discharge status: Home / Self Care | Payer: Medicare Other | Source: Ambulatory Visit | Attending: Internal Medicine | Admitting: Internal Medicine

## 2022-07-27 ENCOUNTER — Ambulatory Visit (HOSPITAL_COMMUNITY)
Admission: RE | Admit: 2022-07-27 | Discharge: 2022-07-27 | Disposition: A | Discharge status: Home / Self Care | Payer: Medicare Other | Source: Ambulatory Visit | Attending: Physician Assistant | Admitting: Physician Assistant

## 2022-07-27 DIAGNOSIS — C3491 Malignant neoplasm of unspecified part of right bronchus or lung: Secondary | ICD-10-CM

## 2022-07-27 DIAGNOSIS — J90 Pleural effusion, not elsewhere classified: Secondary | ICD-10-CM | POA: Diagnosis not present

## 2022-07-27 DIAGNOSIS — J9601 Acute respiratory failure with hypoxia: Secondary | ICD-10-CM | POA: Insufficient documentation

## 2022-07-27 DIAGNOSIS — C3490 Malignant neoplasm of unspecified part of unspecified bronchus or lung: Secondary | ICD-10-CM | POA: Diagnosis not present

## 2022-07-27 DIAGNOSIS — J9811 Atelectasis: Secondary | ICD-10-CM | POA: Diagnosis not present

## 2022-07-27 HISTORY — PX: IR THORACENTESIS ASP PLEURAL SPACE W/IMG GUIDE: IMG5380

## 2022-07-27 MED ORDER — LIDOCAINE HCL 1 % IJ SOLN
INTRAMUSCULAR | Status: AC
  Filled 2022-07-27: qty 20

## 2022-07-27 NOTE — Procedures (Signed)
PROCEDURE SUMMARY:  Successful US guided right thoracentesis. Yielded 1.7 L of amber fluid. Patient tolerated procedure well. No immediate complications. EBL = trace  Specimen was sent for labs.  Post procedure chest X-ray reveals no pneumothorax  Jurell Basista S Zuleyma Scharf PA-C 07/27/2022 4:22 PM

## 2022-07-29 ENCOUNTER — Encounter (HOSPITAL_COMMUNITY)
Admission: RE | Admit: 2022-07-29 | Discharge: 2022-07-29 | Disposition: A | Discharge status: Home / Self Care | Payer: Medicare Other | Source: Ambulatory Visit | Attending: Pulmonary Disease | Admitting: Pulmonary Disease

## 2022-07-29 DIAGNOSIS — R918 Other nonspecific abnormal finding of lung field: Secondary | ICD-10-CM | POA: Diagnosis not present

## 2022-07-29 DIAGNOSIS — R911 Solitary pulmonary nodule: Secondary | ICD-10-CM | POA: Diagnosis not present

## 2022-07-29 LAB — GLUCOSE, CAPILLARY: Glucose-Capillary: 99 mg/dL (ref 70–99)

## 2022-07-29 MED ORDER — FLUDEOXYGLUCOSE F - 18 (FDG) INJECTION
5.0000 | Freq: Once | INTRAVENOUS | Status: AC
  Administered 2022-07-29: 5.4 via INTRAVENOUS

## 2022-08-01 ENCOUNTER — Telehealth: Payer: Self-pay | Admitting: Medical Oncology

## 2022-08-01 DIAGNOSIS — J90 Pleural effusion, not elsewhere classified: Secondary | ICD-10-CM | POA: Diagnosis not present

## 2022-08-01 DIAGNOSIS — E871 Hypo-osmolality and hyponatremia: Secondary | ICD-10-CM | POA: Diagnosis not present

## 2022-08-01 DIAGNOSIS — I499 Cardiac arrhythmia, unspecified: Secondary | ICD-10-CM | POA: Diagnosis not present

## 2022-08-01 DIAGNOSIS — M7989 Other specified soft tissue disorders: Secondary | ICD-10-CM | POA: Diagnosis not present

## 2022-08-01 DIAGNOSIS — C3491 Malignant neoplasm of unspecified part of right bronchus or lung: Secondary | ICD-10-CM | POA: Diagnosis not present

## 2022-08-01 DIAGNOSIS — I480 Paroxysmal atrial fibrillation: Secondary | ICD-10-CM | POA: Diagnosis not present

## 2022-08-01 DIAGNOSIS — I700 Atherosclerosis of aorta: Secondary | ICD-10-CM | POA: Diagnosis not present

## 2022-08-01 DIAGNOSIS — I10 Essential (primary) hypertension: Secondary | ICD-10-CM | POA: Diagnosis not present

## 2022-08-01 NOTE — Telephone Encounter (Signed)
Cytology Positive for Malignant cells -Adenocarcinoma  Has ov with Oncology -Dr. Mckinley Jewel 08/08/22 .  Sent to Dr. Elsworth Soho  for [place] . PET completed.

## 2022-08-01 NOTE — Progress Notes (Unsigned)
**Note Gail-Identified via Obfuscation** [place]   [phone] [phone] [place] Note   Patient Care Team: Burnard Bunting, MD as PCP - General (Internal Medicine) Alla Feeling, NP as Nurse Practitioner (Hematology and Oncology) Date of Service: 08/02/2022  CHIEF COMPLAINTS/PURPOSE OF CONSULTATION:  Right lung cancer; referred by PCP Dr. Burnard Bunting  HISTORY OF PRESENTING ILLNESS:  Gail Jennings 88 y.o. female with past medical history including A-fib, HTN, and hyponatremia is here because of newly diagnosed lung cancer.  She presented to ED by EMS 07/09/2022 with exertional dyspnea and productive cough, found to be in A-fib with RVR, not previously anticoagulated.  Chest x-ray showed new large right pleural effusion with probable underlying consolidation versus atelectasis.  She underwent thoracentesis with minimal improvement; admitted for further workup and management of hypoxic respiratory failure.  She underwent tube thoracostomy 11/5.  Initial cytology of the plural fluid showed lymphocytic exudate. CT chest 11/7 showed persistent right apical and basilar small volume pneumothorax with pigtail catheter, with nodular thickening of the posterior right pleura with focal masslike airspace opacity measuring 3.7 x 2.3 cm, a satellite nodule measuring 3.9 x 1.7 cm, small left pleural effusion, and an indeterminate low-density fluid collection in the posterior right hepatic lobe.  Chest tube drainage decreased and was discontinued on 11/8 however she had increased pneumo on repeat chest x-ray but she did not require chest tube re-insertion. While in the hospital final path pleural fluid showed malignant cells consistent with adenocarcinoma; with IHC staining positive for epithelial marker MOC31 and the pulmonary adeno marker TTF-1, negative for the breast marker GATA3 and the mesothelial marker D2-40 and calretinin; overall this was compatible with a pulmonary primary. She was  discharged on 07/17/22. She became more symptomatic and underwent repeat thora on 11/22 which drained 1.7L.  PET scan 07/29/2022 showed focal masslike hypermetabolic uptake within the right lower lobe and right middle lobe compatible with primary lung cancer, loculated moderate right pleural effusion with nodular hypermetabolic activity of the pleura consistent with pleural metastasis, and hypermetabolic right hilar and mediastinal lymph nodes consistent with nodal metastasis.  There was also focal hypermetabolic uptake of the posterior first left ribs with no definite lesion on CT.  Patient denies pain at this area or known remote fracture although she was very active and athletic in her past.   Socially she is single, lives alone with no local family.  He has 1 daughter who lives in [place].  She was brought in and is accompanied today by Gail Jennings, a friend and patient advocate as well as chaplain at [facility].  She is independent with ADLs except driving recently.  She has rare alcohol use, remote tobacco use quit 50 years ago and was not a heavy smoker, and denies any drug use.  As of 3 months ago she was going to the gym daily, very active and otherwise healthy.  Gail Jennings presents with Gail Jennings, her patient advocate. She has been mostly sedentary since hospital discharge. Since her last thoracentesis she has worsening exertional and conversational dyspnea, stable cough, and generalized fatigue and weakness.  She would like another thoracentesis today if possible.  She does not use oxygen.  Denies hemoptysis, fever, chills, or any pain.  She had some leg swelling in the hospital that weeped and is improving.  She has lost about 5 pounds total.    MEDICAL HISTORY:  Past Medical History:  Diagnosis Date   Atrial fibrillation ([place])  Complication of anesthesia    cannot take anectine   Hypertension    Inguinal hernia     SURGICAL HISTORY: Past Surgical History:  Procedure Laterality  Date   ABDOMINAL HYSTERECTOMY     DENTAL SURGERY  Jan 11, 2011   bone graft and extraction   EYE SURGERY     detached retina, cryo, lens implant   INGUINAL HERNIA REPAIR  09/27/2011   Procedure: HERNIA REPAIR INGUINAL INCARCERATED;  Surgeon: Pedro Earls, MD;  Location: [facility];  Service: General;  Laterality: Left;  Left Ingunial hernia repair with mesh   INGUINAL HERNIA REPAIR Left 10/19/2012   Procedure: LAPAROSCOPIC possible open left femoral hernia repair ;  Surgeon: Pedro Earls, MD;  Location: [facility];  Service: General;  Laterality: Left;  Laparoscopic Left Femoral Hernia Repair with Mesh   IR THORACENTESIS ASP PLEURAL SPACE W/IMG GUIDE  07/27/2022   IR THORACENTESIS ASP PLEURAL SPACE W/IMG GUIDE  08/02/2022   MOUTH SURGERY     lt upper bone graft for implants   TONSILLECTOMY     TUBAL LIGATION      SOCIAL HISTORY: Social History   Socioeconomic History   Marital status: Divorced    Spouse name: Not on file   Number of children: Not on file   Years of education: Not on file   Highest education level: Not on file  Occupational History   Not on file  Tobacco Use   Smoking status: Former    Packs/day: 0.50    Years: 20.00    Total pack years: 10.00    Types: Cigarettes    Quit date: 09/21/1970    Years since quitting: 51.9   Smokeless tobacco: Never  Substance and Sexual Activity   Alcohol use: Yes    Alcohol/week: 2.0 standard drinks of alcohol    Types: 2 Glasses of wine per week    Comment: daily   Drug use: No   Sexual activity: Not on file  Other Topics Concern   Not on file  Social History Narrative   Not on file   Social Determinants of Health   Financial Resource Strain: Not on file  Food Insecurity: Not on file  Transportation Needs: Not on file  Physical Activity: Not on file  Stress: Not on file  Social Connections: Not on file  Intimate Partner Violence: Not on file    FAMILY HISTORY: Family History  Problem Relation  Age of Onset   Parkinson's disease Mother    Healthy Sister     ALLERGIES:  is allergic to codeine, hydrocodone, and succinylcholine chloride.  MEDICATIONS:  Current Outpatient Medications  Medication Sig Dispense Refill   metoprolol tartrate (LOPRESSOR) 25 MG tablet Take 0.5 tablets (12.5 mg total) by mouth 2 (two) times daily. 60 tablet 0   apixaban (ELIQUIS) 2.5 MG TABS tablet Take 1 tablet (2.5 mg total) by mouth 2 (two) times daily. (Patient not taking: Reported on 08/02/2022) 60 tablet 0   No current facility-administered medications for this visit.    REVIEW OF SYSTEMS:   Constitutional: Denies fevers, chills or abnormal night sweats (+) 5 pounds weight loss Eyes: Denies blurriness of vision, double vision or watery eyes Ears, nose, mouth, throat, and face: Denies mucositis or sore throat Respiratory: Denies wheezes (+) exertional and conversational dyspnea (+) cough Cardiovascular: Denies palpitation, chest discomfort or lower extremity swelling (+) mild leg edema, improving Gastrointestinal:  Denies nausea, vomiting, constipation, heartburn or change in bowel habits Skin: Denies  abnormal skin rashes Lymphatics: Denies new lymphadenopathy or easy bruising Neurological:Denies numbness, tingling (+) generalized weaknesses Behavioral/Psych: Mood is stable, no new changes  All other systems were reviewed with the patient and are negative.  PHYSICAL EXAMINATION: ECOG PERFORMANCE STATUS: 2 - Symptomatic, <50% confined to bed  Vitals:   08/02/22 1118  BP: (!) 153/73  Pulse: 77  Resp: 18  Temp: 97.6 F (36.4 C)  SpO2: 96%   Filed Weights   08/02/22 1118  Weight: 103 lb 3 oz (46.8 kg)    GENERAL:alert, no distress and comfortable SKIN: dry, no rash  EYES:  sclera clear LYMPH:  no palpable cervical or supraclavicular lymphadenopathy  LUNGS: clear with normal breathing effort HEART: A-fib, regular rhythm , no murmur, mild bilateral pitting lower extremity  edema ABDOMEN:abdomen soft, non-tender and normal bowel sounds Musculoskeletal:no cyanosis of digits and no clubbing  PSYCH: alert & oriented x3, with fluent speech; appears anxious NEURO: no focal motor/sensory deficits  LABORATORY DATA:  I have reviewed the data as listed    Latest Ref Rng & Units 08/02/2022   10:29 AM 07/13/2022    3:02 AM 07/12/2022    2:29 AM  CBC  WBC 4.0 - 10.5 K/uL 10.1  10.4  10.3   Hemoglobin 12.0 - 15.0 g/dL 14.0  11.9  11.7   Hematocrit 36.0 - 46.0 % 40.6  33.7  33.3   Platelets 150 - 400 K/uL 519  345  331       Latest Ref Rng & Units 08/02/2022   10:29 AM 07/13/2022    3:02 AM 07/12/2022    2:29 AM  CMP  Glucose 70 - 99 mg/dL 107  111  105   BUN 8 - 23 mg/dL _0 Creatinine 0.44 - 1.00 mg/dL 0.64  0.54  0.58   Sodium 135 - 145 mmol/L 133  129  128   Potassium 3.5 - 5.1 mmol/L 4.2  3.8  3.9   Chloride 98 - 111 mmol/L 100  99  99   CO2 22 - 32 mmol/L _1 Calcium 8.9 - 10.3 mg/dL 9.2  8.5  8.0   Total Protein 6.5 - 8.1 g/dL 6.3     Total Bilirubin 0.3 - 1.2 mg/dL 0.4     Alkaline Phos 38 - 126 U/L 137     AST 15 - 41 U/L 19     ALT 0 - 44 U/L 15        RADIOGRAPHIC STUDIES: I have personally reviewed the radiological images as listed and agreed with the findings in the report. IR THORACENTESIS ASP PLEURAL SPACE W/IMG GUIDE  Result Date: 08/02/2022 INDICATION: Lung cancer, recurrent pleural effusion EXAM: ULTRASOUND GUIDED RIGHT THORACENTESIS MEDICATIONS: None. COMPLICATIONS: None immediate. PROCEDURE: An ultrasound guided thoracentesis was thoroughly discussed with the patient and questions answered. The benefits, risks, alternatives and complications were also discussed. The patient understands and wishes to proceed with the procedure. Written consent was obtained. Ultrasound was performed to localize and mark an adequate pocket of fluid in the right chest. The area was then prepped and draped in the normal sterile fashion. 1%  Lidocaine was used for local anesthesia. Under ultrasound guidance a 6 Fr Safe-T-Centesis catheter was introduced. Thoracentesis was performed. The catheter was removed and a dressing applied. FINDINGS: A total of approximately 1.1L of pleural fluid was removed. Samples were sent to the laboratory as requested by the clinical team. IMPRESSION: Successful ultrasound guided  right thoracentesis yielding 1.1L of pleural fluid. Electronically Signed   By: Ruthann Cancer M.D.   On: 08/02/2022 16:25   DG Chest 1 View  Result Date: 08/02/2022 CLINICAL DATA:  Post thoracentesis. EXAM: CHEST  1 VIEW COMPARISON:  Chest radiographs 07/27/2022 and 07/16/2022. PET-CT 07/29/2022. FINDINGS: 1401 hours. The heart size and mediastinal contours are stable with aortic tortuosity. Right pleural effusion appears mildly decreased in volume. Unchanged right basilar airspace opacities. No evidence of pneumothorax. The left lung is clear. IMPRESSION: Decreased right pleural effusion following thoracentesis. No evidence of pneumothorax. Electronically Signed   By: Richardean Sale M.D.   On: 08/02/2022 14:23   NM PET Image Initial (PI) Skull Base To Thigh  Result Date: 07/31/2022 CLINICAL DATA:  Initial treatment strategy for cancer. EXAM: NUCLEAR MEDICINE PET SKULL BASE TO THIGH TECHNIQUE: 5.4 mCi F-18 FDG was injected intravenously. Full-ring PET imaging was performed from the skull base to thigh after the radiotracer. CT data was obtained and used for attenuation correction and anatomic localization. Fasting blood glucose: 99 mg/dl COMPARISON:  Chest CT dated July 12, 2022 FINDINGS: Mediastinal blood pool activity: SUV max 2.4 Liver activity: SUV max 2.7 NECK: No hypermetabolic lymph nodes in the neck. Incidental CT findings: None. CHEST: Focal masslike hypermetabolic uptake within the right lower lobe with SUV max of 16.5, difficult to measure on CT due to associated atelectasis lack of IV contrast but measures proximally 4.5 x  3.4 cm on series 4, image 81. Additional area of focal masslike hypermetabolic uptake is seen in the right middle lobe SUV max of 20.1 measuring approximately 2.5 x 1.5 cm. Loculated moderate pleural effusion with nodular hypermetabolic activity of the pleura of the right hemithorax. Hypermetabolic activity of the posterior right chest wall. Hypermetabolic right hilar and mediastinal lymph nodes. Reference hypermetabolic and enlarged right hilar lymph node measuring 1.1 cm in short axis on series 4, image 70 with SUV max of 11.4. Reference hypermetabolic retrotracheal lymph node measuring 8 mm in short axis on series 4, image 54 with SUV max of 5.9. Incidental CT findings: Moderate atherosclerotic disease of the thoracic aorta. Scattered small solid pulmonary nodules which are below PET-CT resolution. Reference left upper lobe solid nodule measuring 3 mm on series 7, image 19, unchanged in size when compared with the prior. ABDOMEN/PELVIS: No abnormal hypermetabolic activity within the liver, pancreas, adrenal glands, or spleen. No hypermetabolic lymph nodes in the abdomen or pelvis. Incidental CT findings: Diverticulosis. Exophytic simple appearing cyst of the posterior right lobe of the liver. Pelvic right kidney. SKELETON: Focal hypermetabolic uptake of the posterior first left rib with no definite osseous lesion seen on CT. Incidental CT findings: None. IMPRESSION: 1. Focal masslike hypermetabolic uptake within the right lower lobe and right middle lobe, compatible with primary lung malignancy. 2. Loculated moderate right pleural effusion with nodular hypermetabolic activity of the pleura, consistent with pleural metastatic disease. 3. Hypermetabolic right hilar and mediastinal lymph nodes, consistent with metastatic disease. 4. Focal hypermetabolic uptake of the posterior first left rib with no definite osseous lesion seen on CT, concerning for osseous metastatic disease. 5. No evidence of metastatic disease in  the abdomen or pelvis. 6. Soft tissue uptake of the right chest wall, likely due to recent thoracentesis. Electronically Signed   By: Yetta Glassman M.D.   On: 07/31/2022 16:55   IR THORACENTESIS ASP PLEURAL SPACE W/IMG GUIDE  Result Date: 07/27/2022 INDICATION: Lung cancer with recurrent right pleural effusion. Request for diagnostic and therapeutic thoracentesis. EXAM:  ULTRASOUND GUIDED RIGHT THORACENTESIS MEDICATIONS: 1% lidocaine 6 mL COMPLICATIONS: None immediate. PROCEDURE: An ultrasound guided thoracentesis was thoroughly discussed with the patient and questions answered. The benefits, risks, alternatives and complications were also discussed. The patient understands and wishes to proceed with the procedure. Written consent was obtained. Ultrasound was performed to localize and mark an adequate pocket of fluid in the right chest. The area was then prepped and draped in the normal sterile fashion. 1% Lidocaine was used for local anesthesia. Under ultrasound guidance a 6 Fr Safe-T-Centesis catheter was introduced. Thoracentesis was performed. The catheter was removed and a dressing applied. FINDINGS: A total of approximately 1.7 L of amber fluid was removed. Samples were sent to the laboratory as requested by the clinical team. IMPRESSION: Successful ultrasound guided right thoracentesis yielding 1.7 L of pleural fluid. No pneumothorax on post-procedure chest x-ray. Procedure performed by: Gareth Eagle, PA-C Electronically Signed   By: Ruthann Cancer M.D.   On: 07/27/2022 16:47   DG Chest 1 View  Result Date: 07/27/2022 CLINICAL DATA:  Right pleural effusion.  Status post thoracentesis. EXAM: CHEST  1 VIEW COMPARISON:  Chest radiograph 07/16/2022 FINDINGS: The cardiomediastinal silhouette is unchanged. There is a small to moderate residual right pleural effusion with basilar atelectasis. No residual pneumothorax is evident. The left lung is clear. No acute osseous abnormality is seen. IMPRESSION:  Small to moderate residual right pleural effusion with basilar atelectasis. No pneumothorax. Electronically Signed   By: Logan Bores M.D.   On: 07/27/2022 13:40   VAS [place] LOWER EXTREMITY VENOUS (DVT)  Result Date: 07/16/2022  Lower Venous DVT Study Patient Name:  Gail Jennings  Date of Exam:   07/16/2022 Medical Rec #: [id number]           Accession #:    [id number] Date of Birth: 1933-02-04          Patient Gender: F Patient Age:   23 years Exam Location:  [facility] Procedure:      VAS [place] LOWER EXTREMITY VENOUS (DVT) Referring Phys: Vance Gather --------------------------------------------------------------------------------  Indications: Edema.  Risk Factors: Atrial fibrillation. Comparison Study: No prior Performing Technologist: Sharion Dove RVS  Examination Guidelines: A complete evaluation includes B-mode imaging, spectral Doppler, color Doppler, and power Doppler as needed of all accessible portions of each vessel. Bilateral testing is considered an integral part of a complete examination. Limited examinations for reoccurring indications may be performed as noted. The reflux portion of the exam is performed with the patient in reverse Trendelenburg.  +--------+---------------+---------+-----------+----------------+-------------+ RIGHT   CompressibilityPhasicitySpontaneityProperties      Thrombus                                                                 Aging         +--------+---------------+---------+-----------+----------------+-------------+ CFV     Full                               pulsatile  waveforms                     +--------+---------------+---------+-----------+----------------+-------------+ SFJ     Full                                                             +--------+---------------+---------+-----------+----------------+-------------+ FV Prox Full                                                              +--------+---------------+---------+-----------+----------------+-------------+ FV Mid  Full                                                             +--------+---------------+---------+-----------+----------------+-------------+ FV      Full                                                             Distal                                                                   +--------+---------------+---------+-----------+----------------+-------------+ PFV     Full                                                             +--------+---------------+---------+-----------+----------------+-------------+ POP     Full                               pulsatile                                                                waveforms                     +--------+---------------+---------+-----------+----------------+-------------+ PTV     Full                                                             +--------+---------------+---------+-----------+----------------+-------------+  PERO    Full                                                             +--------+---------------+---------+-----------+----------------+-------------+   +--------+---------------+---------+-----------+----------------+-------------+ LEFT    CompressibilityPhasicitySpontaneityProperties      Thrombus                                                                 Aging         +--------+---------------+---------+-----------+----------------+-------------+ CFV     Full                               pulsatile                                                                waveforms                     +--------+---------------+---------+-----------+----------------+-------------+ SFJ     Full                                                              +--------+---------------+---------+-----------+----------------+-------------+ FV Prox Full                                                             +--------+---------------+---------+-----------+----------------+-------------+ FV Mid  Full                                                             +--------+---------------+---------+-----------+----------------+-------------+ FV      Full                                                             Distal                                                                   +--------+---------------+---------+-----------+----------------+-------------+  PFV     Full                                                             +--------+---------------+---------+-----------+----------------+-------------+ POP     Full                               pulsatile                                                                waveforms                     +--------+---------------+---------+-----------+----------------+-------------+ PTV     Full                                                             +--------+---------------+---------+-----------+----------------+-------------+ PERO    Full                                                             +--------+---------------+---------+-----------+----------------+-------------+     Summary: BILATERAL: - No evidence of deep vein thrombosis seen in the lower extremities, bilaterally. -No evidence of popliteal cyst, bilaterally. RIGHT: pulsatile waveforms  LEFT: Pulsatile waveforms.  *See table(s) above for measurements and observations. Electronically signed by Monica Martinez MD on 07/16/2022 at 1:14:13 PM.    Final    DG Chest Port 1 View  Result Date: 07/16/2022 CLINICAL DATA:  [id number] Pneumohemothorax [id number] EXAM: PORTABLE CHEST 1 VIEW COMPARISON:  07/15/2022 FINDINGS: Moderate right pneumothorax again noted, stable. Right basilar opacity with  layering right effusion. Left lung clear. Heart is normal size. Aortic atherosclerosis. No acute bony abnormality. IMPRESSION: Stable moderate-sized right pneumothorax. Right effusion with right basilar atelectasis or infiltrate. No real change. Electronically Signed   By: Rolm Baptise M.D.   On: 07/16/2022 08:26   DG CHEST PORT 1 VIEW  Result Date: 07/15/2022 CLINICAL DATA:  Pleural effusion. EXAM: PORTABLE CHEST 1 VIEW COMPARISON:  July 14, 2022. FINDINGS: The heart size and mediastinal contours are within normal limits. Left lung is clear. Stable right basilar atelectasis or infiltrate is noted with associated pleural effusion. Grossly stable right apical pneumothorax. The visualized skeletal structures are unremarkable. IMPRESSION: Stable right basilar opacity as described above. Stable moderate size right apical pneumothorax. Electronically Signed   By: Marijo Conception M.D.   On: 07/15/2022 08:42   DG Chest 1 View  Result Date: 07/14/2022 CLINICAL DATA:  Follow-up pneumothorax. EXAM: CHEST  1 VIEW COMPARISON:  07/14/2022 at 6:18 a.m. FINDINGS: Unchanged appearance of right-sided pneumothorax. This measures 3.3 cm over the right apex. Previously this measured 4.0 cm. Decreased  aeration to the right lung base compatible with atelectasis. Unchanged small right pleural effusion. IMPRESSION: 1. Similar size of moderate right-sided pneumothorax 2. Persistent small right pleural effusion and right lower lobe atelectasis. Electronically Signed   By: Kerby Moors M.D.   On: 07/14/2022 13:14   DG Chest Port 1 View  Result Date: 07/14/2022 CLINICAL DATA:  88 year old female with pneumothorax following thoracentesis for large right pleural effusion. Chest tube removed yesterday. EXAM: PORTABLE CHEST 1 VIEW COMPARISON:  Portable chest 07/13/2022 and earlier. FINDINGS: Portable AP upright view at 0618 hours. Stable up to moderate size right pneumothorax since yesterday following chest tube removal. Fairly  small volume residual layering right pleural effusion does not appear significantly changed. Some lower lung atelectasis. Stable cardiac size and mediastinal contours. Left lung appears stable and negative. Stable visualized osseous structures. Negative visible bowel gas. IMPRESSION: 1. Stable up to moderate size right pneumothorax since yesterday after chest tube removal. Small volume residual right pleural effusion. 2. No new cardiopulmonary abnormality. Electronically Signed   By: Genevie Ann M.D.   On: 07/14/2022 08:31   DG CHEST PORT 1 VIEW  Result Date: 07/13/2022 CLINICAL DATA:  Chest tube removal. EXAM: PORTABLE CHEST 1 VIEW COMPARISON:  Radiograph earlier today FINDINGS: Removal of right pigtail catheter. The right pneumothorax has increased in size from prior exam, currently 3.4 cm when measured from under the right first rib, previously 1.3 cm. There is likely a basilar component that is also new. There is increasing opacity at the right lung base. Stable heart size and mediastinal contours. No acute findings in the left lung. IMPRESSION: 1. Increased size of right pneumothorax after removal of right pigtail catheter, small to moderate in size. 2. Increasing opacity at the right lung base. These results will be called to the ordering clinician or representative by the Radiologist Assistant, and communication documented in the PACS or [employer]. Electronically Signed   By: Keith Rake M.D.   On: 07/13/2022 20:06   DG Chest Port 1 View  Result Date: 07/13/2022 CLINICAL DATA:  Follow-up chest tube placement EXAM: PORTABLE CHEST 1 VIEW COMPARISON:  07/12/2022 FINDINGS: Unchanged right-sided pigtail thoracostomy tube. Small right apical pneumothorax appears unchanged. Stable cardiomediastinal contour. Asymmetric opacity within the right base is unchanged from previous exam. Left lung clear. IMPRESSION: 1. Stable position of right sided thoracostomy tube with persistent small right apical  pneumothorax. 2. No change in right lower lung opacity. Electronically Signed   By: Kerby Moors M.D.   On: 07/13/2022 08:47   CT CHEST WO CONTRAST  Result Date: 07/12/2022 CLINICAL DATA:  Pleural effusion, malignancy suspected EXAM: CT CHEST WITHOUT CONTRAST TECHNIQUE: Multidetector CT imaging of the chest was performed following the standard protocol without IV contrast. RADIATION DOSE REDUCTION: This exam was performed according to the departmental dose-optimization program which includes automated exposure control, adjustment of the mA and/or kV according to patient size and/or use of iterative reconstruction technique. COMPARISON:  Radiographs earlier today FINDINGS: Cardiovascular: Aortic and coronary artery atherosclerotic calcification. Normal heart size. No pericardial effusion. Mediastinum/Nodes: No thoracic adenopathy by size noting limitations of noncontrast exam. 1.1 cm right thyroid nodule. No follow-up recommended. Mildly patulous esophagus with some frothy fluid reaching the thoracic inlet. Lungs/Pleura: Persistent right apical and basilar small volume pneumothorax. Right pleural pigtail catheter along the right minor fissure. Small left pleural effusion. Nodular thickening of the posterior right pleura with focal masslike airspace opacity measuring 3.7 by 2.3 cm. Nodular consolidation along the right minor fissure  measuring 3.9 x 1.7 cm. Scattered nodular interlobular septal thickening throughout the right lung. Patchy ground-glass opacities in the right upper and middle lobe. The left lung is clear. 3 mm pulmonary nodule in the lateral left upper lobe (series 4/image 57). Upper Abdomen: Low-density fluid collection posterior to the right hepatic lobe measures 3.8 x 2.4 cm. Musculoskeletal: No chest wall mass or evidence of osseous metastases. Subcutaneous gas along the right lower posterior chest wall. IMPRESSION: Persistent right apical and basilar pneumothorax despite thoracostomy tube.  Nodular interlobular septal thickening with more focal areas of nodular consolidation along the right minor fissure and posterior right lower lobe. Findings are concerning for malignancy with lymphangitic spread. Small right pleural effusion. Possible loculated subpulmonic effusion versus right lower lobe mass along the posterior right lower lobe. Indeterminate low-density fluid collection posterior to the right hepatic lobe. Electronically Signed   By: Placido Sou M.D.   On: 07/12/2022 21:59   DG CHEST PORT 1 VIEW  Result Date: 07/12/2022 CLINICAL DATA:  RIGHT pleural effusion EXAM: PORTABLE CHEST 1 VIEW COMPARISON:  Portable exam 0633 hours compared to 07/11/2022 FINDINGS: Pigtail RIGHT thoracostomy tube unchanged. Normal heart size, mediastinal contours, and pulmonary vascularity. Aorta Persistent small RIGHT apex pneumothorax despite thoracostomy tube. Associated mild RIGHT basilar atelectasis and questionable small sub pulmonic effusion. No acute infiltrate or acute osseous findings. IMPRESSION: Persistent small RIGHT apex pneumothorax and RIGHT basilar atelectasis with questionable small subpulmonic RIGHT pleural effusion. Electronically Signed   By: Lavonia Dana M.D.   On: 07/12/2022 10:01   DG Chest Port 1 View  Result Date: 07/11/2022 CLINICAL DATA:  Follow-up chest 2 EXAM: PORTABLE CHEST 1 VIEW COMPARISON:  07/10/2022 FINDINGS: Right chest tube remains in place. Small amount of pleural air at the apex persists, less than 5%. Some atelectasis persists in the right lower lung. Chronic aortic atherosclerosis. No new finding. IMPRESSION: No significant change. Right chest tube remains in place. Small amount of pleural air at the apex persists, less than 5%. Electronically Signed   By: Nelson Chimes M.D.   On: 07/11/2022 08:18   DG Chest Port 1 View  Result Date: 07/10/2022 CLINICAL DATA:  Right pleural effusion, placement of right chest tube EXAM: PORTABLE CHEST 1 VIEW COMPARISON:  Previous  studies including the examination done earlier today FINDINGS: Transverse diameter of heart is slightly increased. Thoracic aorta is tortuous and ectatic. There are no signs of pulmonary edema. There is improvement in aeration of right mid and right lower lung fields after placement of right chest tube suggesting significant interval decrease in right pleural effusion. There is small right apical pneumothorax measuring 5 mm in diameter. Right lung remains well expanded. Residual increased markings are seen in right lower lung field. IMPRESSION: There is marked interval decrease in right pleural effusion after placement of right chest tube. Small right apical pneumothorax is seen. Electronically Signed   By: Elmer Picker M.D.   On: 07/10/2022 17:25   ECHOCARDIOGRAM COMPLETE  Result Date: 07/10/2022    ECHOCARDIOGRAM REPORT   Patient Name:   Gail Jennings Date of Exam: 07/10/2022 Medical Rec #:  [id number]          Height:       63.0 in Accession #:    [id number]         Weight:       108.0 lb Date of Birth:  Jul 28, 1933         BSA:  1.488 m Patient Age:    65 years           BP:           110/64 mmHg Patient Gender: F                  HR:           80 bpm. Exam Location:  Inpatient Procedure: 2D Echo, Cardiac Doppler and Color Doppler Indications:    atrial fibrillation  History:        Patient has prior history of Echocardiogram examinations, most                 recent 08/28/2019. Risk Factors:Hypertension.  Sonographer:    Johny Chess RDCS Referring Phys: [place]  1. Left ventricular ejection fraction, by estimation, is 60 to 65%. The left ventricle has normal function. The left ventricle has no regional wall motion abnormalities. There is mild asymmetric left ventricular hypertrophy of the basal-septal segment. Left ventricular diastolic parameters are indeterminate.  2. Right ventricular systolic function is normal. The right ventricular size is normal. There  is normal pulmonary artery systolic pressure. The estimated right ventricular systolic pressure is 37.8 mmHg.  3. The mitral valve is normal in structure. Trivial mitral valve regurgitation. No evidence of mitral stenosis.  4. Tricuspid valve regurgitation is mild to moderate.  5. The aortic valve was not well visualized. Aortic valve regurgitation is not visualized. Aortic valve sclerosis/calcification is present, without any evidence of aortic stenosis.  6. The inferior vena cava is normal in size with greater than 50% respiratory variability, suggesting right atrial pressure of 3 mmHg. FINDINGS  Left Ventricle: Left ventricular ejection fraction, by estimation, is 60 to 65%. The left ventricle has normal function. The left ventricle has no regional wall motion abnormalities. The left ventricular internal cavity size was normal in size. There is  mild asymmetric left ventricular hypertrophy of the basal-septal segment. Left ventricular diastolic parameters are indeterminate. Right Ventricle: The right ventricular size is normal. No increase in right ventricular wall thickness. Right ventricular systolic function is normal. There is normal pulmonary artery systolic pressure. The tricuspid regurgitant velocity is 2.73 m/s, and  with an assumed right atrial pressure of 3 mmHg, the estimated right ventricular systolic pressure is 58.8 mmHg. Left Atrium: Left atrial size was normal in size. Right Atrium: Right atrial size was normal in size. Pericardium: There is no evidence of pericardial effusion. Mitral Valve: The mitral valve is normal in structure. Trivial mitral valve regurgitation. No evidence of mitral valve stenosis. Tricuspid Valve: The tricuspid valve is normal in structure. Tricuspid valve regurgitation is mild to moderate. Aortic Valve: The aortic valve was not well visualized. Aortic valve regurgitation is not visualized. Aortic valve sclerosis/calcification is present, without any evidence of aortic  stenosis. Pulmonic Valve: The pulmonic valve was not well visualized. Pulmonic valve regurgitation is not visualized. Aorta: The aortic root and ascending aorta are structurally normal, with no evidence of dilitation. Venous: The inferior vena cava is normal in size with greater than 50% respiratory variability, suggesting right atrial pressure of 3 mmHg. IAS/Shunts: The interatrial septum was not well visualized.  LEFT VENTRICLE PLAX 2D LVIDd:         3.90 cm   Diastology LVIDs:         2.30 cm   LV e' medial:    7.18 cm/s LV PW:         0.80 cm   LV  E/e' medial:  10.9 LV IVS:        1.00 cm   LV e' lateral:   7.40 cm/s LVOT diam:     1.90 cm   LV E/e' lateral: 10.6 LV SV:         60 LV SV Index:   41 LVOT Area:     2.84 cm  RIGHT VENTRICLE             IVC RV S prime:     19.30 cm/s  IVC diam: 0.50 cm TAPSE (M-mode): 2.5 cm LEFT ATRIUM           Index        RIGHT ATRIUM           Index LA diam:      3.30 cm 2.22 cm/m   RA Area:     12.20 cm LA Vol (A2C): 25.4 ml 17.07 ml/m  RA Volume:   29.10 ml  19.55 ml/m LA Vol (A4C): 50.7 ml 34.07 ml/m  AORTIC VALVE LVOT Vmax:   113.00 cm/s LVOT Vmean:  75.100 cm/s LVOT VTI:    0.213 m  AORTA Ao Root diam: 3.20 cm Ao Asc diam:  3.40 cm MITRAL VALVE               TRICUSPID VALVE MV Area (PHT): 3.65 cm    TR Peak grad:   29.8 mmHg MV Decel Time: 208 msec    TR Vmax:        273.00 cm/s MV E velocity: 78.50 cm/s MV A velocity: 55.50 cm/s  SHUNTS MV E/A ratio:  1.41        Systemic VTI:  0.21 m                            Systemic Diam: 1.90 cm Oswaldo Milian MD Electronically signed by Oswaldo Milian MD Signature Date/Time: 07/10/2022/1:31:43 PM    Final    DG Chest Port 1 View  Result Date: 07/10/2022 CLINICAL DATA:  [id number] with right pleural effusion. EXAM: PORTABLE CHEST 1 VIEW COMPARISON:  Portable chest yesterday following right thoracentesis at 5:55 p.m., PA Lat yesterday before the thoracentesis at 1:57 p.m. FINDINGS: Moderate right pleural effusion  continues to be seen. The volume may have decreased in the interval since the last film or redistributed as there is partial reaeration of the right lower lung field in the interval. No pneumothorax is seen. The right upper and left lung fields are clear. There is mild cardiomegaly with normal caliber central vessels. The aorta is tortuous with patchy calcification. Stable mediastinum. Osteopenia. IMPRESSION: Moderate right pleural effusion continues to be seen. The volume may have decreased in the interval since the last film or redistributed as there is partial reaeration of the right lower lung field. In all other respects no further changes are seen.  No pneumothorax. Electronically Signed   By: Telford Nab M.D.   On: 07/10/2022 06:33   DG CHEST PORT 1 VIEW  Result Date: 07/09/2022 CLINICAL DATA:  Status post thoracentesis. EXAM: PORTABLE CHEST 1 VIEW COMPARISON:  Radiograph earlier today. FINDINGS: Slight decreased size of right pleural effusion post thoracentesis. No pneumothorax. Persistent opacification of the lower 2/3 of right hemithorax likely represents a combination of pleural fluid and airspace disease. The left lung appears clear. Heart size is obscured. IMPRESSION: 1. Slight decreased size of right pleural effusion post thoracentesis. No pneumothorax. 2. Persistent opacification of the lower 2/3  of right hemithorax likely represents a combination of residual pleural fluid and airspace disease. Electronically Signed   By: Keith Rake M.D.   On: 07/09/2022 18:07   DG Chest 2 View  Result Date: 07/09/2022 CLINICAL DATA:  Shortness of breath and wheezing. EXAM: CHEST - 2 VIEW COMPARISON:  08/16/2019 chest radiograph FINDINGS: A new large RIGHT pleural effusion is noted with probable underlying consolidation or atelectasis. The LEFT lung is clear. The visualized portions of the cardiomediastinal silhouette are stable. There is no evidence of pneumothorax or acute bony abnormality.  IMPRESSION: New large RIGHT pleural effusion with probable underlying consolidation or atelectasis. Electronically Signed   By: Margarette Canada M.D.   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 She presented with dyspnea and Afib, work up showed large R pleural effusion requiring chest tube, CT concerning for primary right lung cancer with nodal metastasis. Pathology from pleural fluid confirmed adenocarcinoma c/w lung primary.  -We reviewed her PET scan which shows hypermetabolic R lung mass, adenopathy, and pleura c/w metastatic disease. There was hypermetabolism in a left rib without discrete osseous lesion on CT -We are referring her for brain MRI to complete staging. -We discussed that given the findings, and nature of malignant effusion, this is metastatic disease which is not resectable and therefore likely not curable.  -We recommend Guardant 360 liquid biopsy to see if she is eligible for targeted therapy, and PD-L1 to see if she is a candidate for immunotherapy. Unfortunately we can't get PD-L1 from pleural fluid, has to be tissue sample -Will check with IR to see if there is anything approachable percutaneously, and if not will refer to pulmonology. I spoke to Dr. Elsworth Soho who saw her in the hospital who feels she may need navigation bronch if IR can't access -Gail Jennings appears stable, but frail, and symptomatic from recurrent pleural effusion. Lung sounds absent on the right. She is being scheduled for thoracentesis later today and will return back to our lab for guardant 360.  -She is open to further work up and to see what treatment options are available to her. Although she is reluctant to consider chemo, which I agree, given her age and lack of family support. The treatment goal would be palliative. -Pt seen with Dr. Burr Medico. Gail.  Jennings would like to meet Dr. Julien Nordmann upon his return to clinic. We will refer her, to be seen after staging work up, molecular testing, and additional biopsy are complete.  Malignant right pleural effusion -Secondary to #1 -S/p thoracentesis in the hospital, then again on 11/22 and will be repeated today 11/28 after the consult -We discussed the nature of malignant effusions and that they will recur, we introduced the possibility of indwelling pleurX catheter to help with her symptoms.  I asked IR to discuss with her as well  Afib, HTN -A-fib found on presentation to ED for lung cancer workup, not previously on anticoagulation -Tolerating Eliquis and metoprolol  Social -She was healthy, active and independent prior to diagnosis, lives alone  -She does not have family here but strong community and church support -Will refer her to transportation services/social work  Goals of care  -She understands her diseases not operable and therefore not curable, but still treatable -The treatment goal is palliative -Full code as of 07/09/2022 hospitalization   PLAN: -  Hospital course, labs, imaging, and path reviewed -Therapeutic thoracentesis today at [facility], IR to discuss pleurX placement  -Brain MRI to complete staging, in the next week -Guardant 360 molecular testing today -IR biopsy vs bronchoscopy for additional tissue for PD-L1 testing -Refer to SW/transportation services  -Transfer to Dr. Julien Nordmann after above work up is complete   Orders Placed This Encounter  Procedures   MR Brain W Wo Contrast    Standing Status:   Future    Standing Expiration Date:   08/02/2023    Order Specific Question:   If indicated for the ordered procedure, I authorize the administration of contrast media per Radiology protocol    Answer:   Yes    Order Specific Question:   What is the patient's sedation requirement?    Answer:   No Sedation    Order Specific Question:   Does the patient have a pacemaker  or implanted devices?    Answer:   No    Order Specific Question:   Use SRS Protocol?    Answer:   No    Order Specific Question:   Preferred imaging location?    Answer:   [facility] (table limit - 550 lbs)   Guardant 360    Standing Status:   Future    Number of Occurrences:   1    Standing Expiration Date:   08/02/2023   Ambulatory referral to Social Work    Referral Priority:   Routine    Referral Type:   Consultation    Referral Reason:   Specialty Services Required    Number of Visits Requested:   1    All questions were answered. The patient knows to call the clinic with any problems, questions or concerns.     Alla Feeling, NP 08/03/2022   Addendum I have seen the patient, examined her. I agree with the assessment and and plan and have edited the notes.   88 year old female with past medical history of hypertension and atrial fibrillation, presented with dyspnea and a cough.  I personally reviewed her CT and PET scan images, and discussed the cytology results from her thoracentesis, she confirmed metastatic adenocarcinoma, TTF-1 positive, consistent with lung primary.  Will obtain brain MRI with and without contrast to complete staging.  Unfortunately her disease is not curable, and overall prognosis is poor.  Her cytology sample from thoracentesis is not adequate for PD-L1 test, will refer her to pulmonology for bronchoscopy and biopsy to get tissue.  Will obtain liquid biopsy Guardant360 today, to see if she is a candidate for targeted therapy or immunotherapy.  Due to her advanced age, limited social support, she is not a good candidate for cytotoxic chemotherapy.  Patient is particularly interested in immunotherapy.  She has significant exertional dyspnea, we will schedule thoracentesis today for symptom relief.  We also discussed the role of Pleurx.  I answered all her questions.  Patient was initially scheduled with Dr. Julien Nordmann next week, but her primary care  physician requested pt to be seen [place] sooner. Patient would prefer to see Dr. Julien Nordmann, we will schedule her next visit with Dr. Earlie Server next week.   Truitt Merle MD  08/02/2022

## 2022-08-01 NOTE — Telephone Encounter (Signed)
Pt confirmed appts for tomorrow.

## 2022-08-02 ENCOUNTER — Inpatient Hospital Stay: Payer: Medicare Other | Attending: Nurse Practitioner

## 2022-08-02 ENCOUNTER — Other Ambulatory Visit: Payer: Self-pay

## 2022-08-02 ENCOUNTER — Ambulatory Visit (HOSPITAL_COMMUNITY)
Admission: RE | Admit: 2022-08-02 | Discharge: 2022-08-02 | Disposition: A | Discharge status: Home / Self Care | Payer: Medicare Other | Source: Ambulatory Visit | Attending: Nurse Practitioner | Admitting: Nurse Practitioner

## 2022-08-02 ENCOUNTER — Ambulatory Visit (HOSPITAL_COMMUNITY)
Admission: RE | Admit: 2022-08-02 | Discharge: 2022-08-02 | Disposition: A | Discharge status: Home / Self Care | Payer: Medicare Other | Source: Ambulatory Visit | Attending: Physician Assistant | Admitting: Physician Assistant

## 2022-08-02 ENCOUNTER — Inpatient Hospital Stay: Payer: Medicare Other

## 2022-08-02 ENCOUNTER — Inpatient Hospital Stay (HOSPITAL_BASED_OUTPATIENT_CLINIC_OR_DEPARTMENT_OTHER): Payer: Medicare Other | Admitting: Nurse Practitioner

## 2022-08-02 VITALS — BP 131/73

## 2022-08-02 VITALS — BP 153/73 | HR 77 | Temp 97.6°F | Resp 18 | Ht 61.0 in | Wt 103.2 lb

## 2022-08-02 DIAGNOSIS — Z87891 Personal history of nicotine dependence: Secondary | ICD-10-CM

## 2022-08-02 DIAGNOSIS — Z7901 Long term (current) use of anticoagulants: Secondary | ICD-10-CM

## 2022-08-02 DIAGNOSIS — C3491 Malignant neoplasm of unspecified part of right bronchus or lung: Secondary | ICD-10-CM

## 2022-08-02 DIAGNOSIS — J910 Malignant pleural effusion: Secondary | ICD-10-CM | POA: Insufficient documentation

## 2022-08-02 DIAGNOSIS — Z79899 Other long term (current) drug therapy: Secondary | ICD-10-CM | POA: Diagnosis not present

## 2022-08-02 DIAGNOSIS — I4891 Unspecified atrial fibrillation: Secondary | ICD-10-CM | POA: Insufficient documentation

## 2022-08-02 DIAGNOSIS — C779 Secondary and unspecified malignant neoplasm of lymph node, unspecified: Secondary | ICD-10-CM | POA: Diagnosis not present

## 2022-08-02 DIAGNOSIS — J90 Pleural effusion, not elsewhere classified: Secondary | ICD-10-CM

## 2022-08-02 DIAGNOSIS — C3490 Malignant neoplasm of unspecified part of unspecified bronchus or lung: Secondary | ICD-10-CM | POA: Diagnosis not present

## 2022-08-02 DIAGNOSIS — I10 Essential (primary) hypertension: Secondary | ICD-10-CM | POA: Insufficient documentation

## 2022-08-02 HISTORY — PX: IR THORACENTESIS ASP PLEURAL SPACE W/IMG GUIDE: IMG5380

## 2022-08-02 LAB — CBC WITH DIFFERENTIAL (CANCER CENTER ONLY)
Abs Immature Granulocytes: 0.06 10*3/uL (ref 0.00–0.07)
Basophils Absolute: 0.1 10*3/uL (ref 0.0–0.1)
Basophils Relative: 1 %
Eosinophils Absolute: 0.1 10*3/uL (ref 0.0–0.5)
Eosinophils Relative: 1 %
HCT: 40.6 % (ref 36.0–46.0)
Hemoglobin: 14 g/dL (ref 12.0–15.0)
Immature Granulocytes: 1 %
Lymphocytes Relative: 7 %
Lymphs Abs: 0.7 10*3/uL (ref 0.7–4.0)
MCH: 32.9 pg (ref 26.0–34.0)
MCHC: 34.5 g/dL (ref 30.0–36.0)
MCV: 95.3 fL (ref 80.0–100.0)
Monocytes Absolute: 1.1 10*3/uL — ABNORMAL HIGH (ref 0.1–1.0)
Monocytes Relative: 10 %
Neutro Abs: 8.2 10*3/uL — ABNORMAL HIGH (ref 1.7–7.7)
Neutrophils Relative %: 80 %
Platelet Count: 519 10*3/uL — ABNORMAL HIGH (ref 150–400)
RBC: 4.26 MIL/uL (ref 3.87–5.11)
RDW: 12.1 % (ref 11.5–15.5)
WBC Count: 10.1 10*3/uL (ref 4.0–10.5)
nRBC: 0 % (ref 0.0–0.2)

## 2022-08-02 LAB — CMP (CANCER CENTER ONLY)
ALT: 15 U/L (ref 0–44)
AST: 19 U/L (ref 15–41)
Albumin: 3.5 g/dL (ref 3.5–5.0)
Alkaline Phosphatase: 137 U/L — ABNORMAL HIGH (ref 38–126)
Anion gap: 8 (ref 5–15)
BUN: 12 mg/dL (ref 8–23)
CO2: 25 mmol/L (ref 22–32)
Calcium: 9.2 mg/dL (ref 8.9–10.3)
Chloride: 100 mmol/L (ref 98–111)
Creatinine: 0.64 mg/dL (ref 0.44–1.00)
GFR, Estimated: >60 mL/min (ref >60)
Glucose, Bld: 107 mg/dL — ABNORMAL HIGH (ref 70–99)
Potassium: 4.2 mmol/L (ref 3.5–5.1)
Sodium: 133 mmol/L — ABNORMAL LOW (ref 135–145)
Total Bilirubin: 0.4 mg/dL (ref 0.3–1.2)
Total Protein: 6.3 g/dL — ABNORMAL LOW (ref 6.5–8.1)

## 2022-08-02 MED ORDER — LIDOCAINE HCL 1 % IJ SOLN
INTRAMUSCULAR | Status: AC
  Administered 2022-08-02: 10 mL
  Filled 2022-08-02: qty 20

## 2022-08-02 NOTE — Procedures (Signed)
PROCEDURE SUMMARY:  Successful US guided right thoracentesis. Yielded 1.1L of pleural fluid. Pt tolerated procedure well. No immediate complications.  Specimen was sent for labs. CXR ordered.  EBL < 5 mL  Parilee Hally PA-C 08/02/2022 2:07 PM

## 2022-08-02 NOTE — Progress Notes (Signed)
I met with the patient today and her patient advocate/friend, Gail Jennings. Gail Jennings is the pts friend and a Clinical biochemist at [employer]. I introduced myself and explained the role of a nurse navigator and that I will assist and support the pt in any way I can. The patients primary concern during this appointment was getting a thoracentesis as soon as she can to help relieve her shortness of breath.The pt was first seen by Gail Rue, NP followed by Gail Jennings. The patient was overwhelmed with the information provided about the additional tests, imaging, appts, etc. that may be needed. The pt's friend, Gail Jennings, did a great deal of question-asking, per the desire of the patient. Gail Jennings asked about "for profit" palliative care. I explained we have a palliative care team here, but pt was hesitant to receive more information at the time. I will address this further at another time. Gail Jennings explained to the patient that she will need to have blood drawn to test for any cancer markers that may benefit from targeted therapies, and a test to see if she would benefit from immunotherapy. Pt was eager to have these tests done. Explained to the pt that after she has her thoracentesis at [facility] this afternoon, she can return to have her blood drawn for testing. Gail Jennings also ordered a brain MRI to complete staging work-up. Gail Jennings provided with the number to central scheduling so the patient can schedule the MRI.   Pt was becoming very overwhelmed by all the information provided. I gave Gail Jennings my card and let her know I would be following up with her soon.

## 2022-08-03 ENCOUNTER — Encounter: Payer: Self-pay | Admitting: Nurse Practitioner

## 2022-08-03 ENCOUNTER — Other Ambulatory Visit: Payer: Self-pay

## 2022-08-03 DIAGNOSIS — C3491 Malignant neoplasm of unspecified part of right bronchus or lung: Secondary | ICD-10-CM

## 2022-08-04 ENCOUNTER — Inpatient Hospital Stay: Payer: Medicare Other | Admitting: Licensed Clinical Social Worker

## 2022-08-04 ENCOUNTER — Telehealth: Payer: Self-pay

## 2022-08-04 ENCOUNTER — Other Ambulatory Visit: Payer: Self-pay | Admitting: Medical Oncology

## 2022-08-04 DIAGNOSIS — C3491 Malignant neoplasm of unspecified part of right bronchus or lung: Secondary | ICD-10-CM

## 2022-08-04 NOTE — Progress Notes (Signed)
[place] Work  Initial Assessment   Gail Jennings is a 88 y.o. year old female contacted by phone. Clinical Social Work was referred by medical provider for assessment of psychosocial needs.   SDOH (Social Determinants of Health) assessments performed: Yes   SDOH Screenings   Tobacco Use: Medium Risk (08/03/2022)     Distress Screen completed: No     No data to display            Family/Social Information:  Housing Arrangement: patient lives alone Family members/support persons in your life? Pt's daughter resides in [place] and her sister is in [place].  Pt is involved with her church community and prior to a couple of weeks ago was extremely active in the community as well as going to the gym daily.  Upon admitting to [facility] in early November pt met patient advocate [name] who is assisting pt w/ booking appointments and transportation as pt is not driving at this time.  Per pt [name] is assisting her with exploring home care options should she require assistance at home.  [name] is a Clinical biochemist at [employer] and connected to [employer] .   Transportation concerns: Pt was driving prior to admitting to the hospital.  At present pt is receiving transportation assistance from [place], but has friends who may be able to assist as well as needed.  Transportation to be assessed once treatment is decided upon.    Employment: Retired .  Income source: Paediatric nurse concerns: No Type of concern: None Food access concerns: no Religious or spiritual practice: Yes-[employer] Services Currently in place:  none  Coping/ Adjustment to diagnosis: Patient understands treatment plan and what happens next? Pt is currently undergoing workup to determine what treatment options will be available.  Once diagnostic testing is complete pt will meet w/ Dr. Julien Nordmann to discuss options.   Concerns about diagnosis and/or treatment: Overwhelmed by  information, How will I care for myself, and Quality of life Patient reported stressors: Anxiety/ nervousness, Adjusting to my illness, and Facing my mortality Hopes and/or priorities: Pt's priority is to complete diagnostic testing and begin treatment w/ the hope of a positive result. Patient enjoys exercise and time with family/ friends Current coping skills/ strengths: Average or above average intelligence , Capable of independent living , Scientist, research (life sciences) , Motivation for treatment/growth , Physical Health , and Supportive family/friends     SUMMARY: Current SDOH Barriers:  Limited social support  Clinical Social Work Clinical Goal(s):  Once treatment is determined pt to be reassessed for goals.  Interventions: Discussed common feeling and emotions when being diagnosed with cancer, and the importance of support during treatment Informed patient of the support team roles and support services at [facility] Provided CSW contact information and encouraged patient to call with any questions or concerns Pt verbalized appreciation of receiving contact information for CSW and will contact when she is able to assess for herself what her needs may be.   Follow Up Plan:  CSW to try to meet w/ pt following her initial appointment w/ Dr. Julien Nordmann. Patient verbalizes understanding of plan: Yes    Henriette Combs, LCSW

## 2022-08-04 NOTE — Telephone Encounter (Signed)
This afternoon I spoke with the [name], the pts friend and caregiver, at her request to go over the information that was given during the pts appointment with Cira Rue and Dr Burr Medico on 11/28. Pt was overwhelmed at the time with all the information given to her and was more focused on getting the thoracentesis she needed to help her breath easier after her appt with Dr. Burr Medico. [name] was concerned about getting in touch with a Education officer, museum and asked me what it is they do. I explained to her that Dr Burr Medico placed a referral to [facility] and the social worker will be able to explain the services they provide. She wanted to know about the biopsy the pt may need to get a tissue sample for the genetic testing, as you cannot test for PD-L1 with just blood or pleural fluid. I told her that a pulmonologist would have to evaluate her to see if she is a candidate for a bronchoscopy and that I had placed a referral to pulmonology for the pt and they should be reaching out to her to make an appointment. She inquired about palliative care and was hoping to avoid a "for profit" location/service. I told her she could ask the pts PCM, Dr Reynaldo Minium, on 12/1 about her options, but that we have a Palliative Care team here, as well. I told her I would call her on Friday to follow up with the pt's [facility] appointment. She wanted to know about setting up a "tap" (thoracentesis) for the pt close to the date of the MRI so it's easier for her to breath while laying flat. I told her that once her MRI is scheduled, I would ask Lacie about how to schedule that, and would get back to her once I was able to provide an answer. She wanted the pts MyChart set up so she could have access to it. I informed her that a scheduler can help her and I will get in touch with one to assist her with that. When I speak to her on Friday, I will let her know if this needs to be done in person with the patient there and not just over the phone. [name] denied having any  additional questions and appreciated the information I was able to give her. I told her she may reach out to me with any questions or concerns she may have.

## 2022-08-08 ENCOUNTER — Ambulatory Visit: Payer: Medicare Other | Admitting: Internal Medicine

## 2022-08-08 ENCOUNTER — Other Ambulatory Visit: Payer: Self-pay | Admitting: Nurse Practitioner

## 2022-08-08 ENCOUNTER — Other Ambulatory Visit: Payer: Medicare Other

## 2022-08-08 DIAGNOSIS — C3491 Malignant neoplasm of unspecified part of right bronchus or lung: Secondary | ICD-10-CM

## 2022-08-08 DIAGNOSIS — J910 Malignant pleural effusion: Secondary | ICD-10-CM

## 2022-08-09 ENCOUNTER — Ambulatory Visit (HOSPITAL_COMMUNITY)
Admission: RE | Admit: 2022-08-09 | Discharge: 2022-08-09 | Disposition: A | Discharge status: Home / Self Care | Payer: Medicare Other | Source: Ambulatory Visit | Attending: Nurse Practitioner | Admitting: Nurse Practitioner

## 2022-08-09 ENCOUNTER — Telehealth: Payer: Self-pay | Admitting: Internal Medicine

## 2022-08-09 ENCOUNTER — Telehealth: Payer: Self-pay

## 2022-08-09 ENCOUNTER — Ambulatory Visit (HOSPITAL_COMMUNITY): Payer: Medicare Other | Admitting: Physician Assistant

## 2022-08-09 ENCOUNTER — Ambulatory Visit (HOSPITAL_COMMUNITY)
Admission: RE | Admit: 2022-08-09 | Discharge: 2022-08-09 | Disposition: A | Discharge status: Home / Self Care | Payer: Medicare Other | Source: Ambulatory Visit | Attending: Student | Admitting: Student

## 2022-08-09 VITALS — BP 155/82

## 2022-08-09 DIAGNOSIS — C3491 Malignant neoplasm of unspecified part of right bronchus or lung: Secondary | ICD-10-CM

## 2022-08-09 DIAGNOSIS — J910 Malignant pleural effusion: Secondary | ICD-10-CM

## 2022-08-09 DIAGNOSIS — J90 Pleural effusion, not elsewhere classified: Secondary | ICD-10-CM | POA: Diagnosis not present

## 2022-08-09 HISTORY — PX: IR THORACENTESIS ASP PLEURAL SPACE W/IMG GUIDE: IMG5380

## 2022-08-09 LAB — CYTOLOGY - NON PAP

## 2022-08-09 NOTE — Progress Notes (Unsigned)
[facility] OFFICE PROGRESS NOTE  Burnard Jennings, Gail Jennings  DIAGNOSIS: Stage IV non-small cell lung cancer (T1, N2, M1a) adenocarcinoma.  The patient presented with a masslike hypermetabolic lesion in the right lower lobe and right middle lobe compatibility with primary lung malignancy. She has a malignant right pleural effusion with nodular hypermetabolic activity of the pleura consistent with pleural metastatic disease.  She also has hypermetabolic right hilar and mediastinal lymph nodes.  She was diagnosed in November 2023  Molecular studies:  -Positive for KRAS G12C  PDL1: pending  PRIOR THERAPY: None   CURRENT THERAPY: Reduced dose carboplatin for an AUC of 4 and Alimta 500 mg/m IV every 3 weeks for 2 cycles of chemotherapy in addition to nivolumab 360 mg every 3 weeks and ipilimumab milligrams per kilogram every 6 weeks.  First dose expected on 08/17/2022 pending the results of her PD-L1.  We may remove the chemotherapy if she has a high PD-L1 expression.  INTERVAL HISTORY: Gail Jennings 88 y.o. female returns to the clinic today for a follow-up visit accompanied by her advocate. The patient was first seen in the clinic last week by Dr. Burr Medico and Regan Rakers.  The patient is here to establish care with Dr. Julien Nordmann today. The patients evaluation started after she presented to the emergency room earlier November 2023 for dyspnea on exertion and cough.  Imaging showed a large pleural effusion.  Cytology showed malignant cells consistent with adenocarcinoma, compatible with primary lung malignancy.  Socially, the patient is single and lives alone.  She does not have any family locally.  She is accompanied by her advocate today.  She has a remote smoking history, having quit approximately 50 years ago and she states she was not heavy smoker. She is concerned about adverse side effects of chemotherapy.  She is very interested in  immunotherapy.  The patient has underwent thoracentesis 11/4, 11/18, 11/28 and on 12/5.  She obtained 1.2, 1.7, 1.1, and 2 L of fluid respectively from each procedure.  Her most recent thoracentesis was yesterday on 12/5 which obtained 2 L of fluid. Her conversational dyspnea and dyspnea on exertion is significantly improved since having this performed yesterday.  The patient is very reluctant to consider a Pleurx catheter.  She is concerned that this will interfere with her activities of daily living.  She is very active and goes to the gym every afternoon and is involved on multiple committees.  She is also worried about complications from a Pleurx catheter, although we discussed the risks associated with repeat thoracenteses as well.  She is wondering if she can have standing orders performed for stat/immediate thoracentesis procedures.  Since having this performed, she has some mild bilateral lower extremity swelling.   Otherwise, she she denies any fever, chills, or night sweats.  Denies any nausea, vomiting, diarrhea, or constipation.  Denies any chest pain, hemoptysis, or significant cough.  She states she only coughs "once in a while".  Denies any headache or visual changes.  The patient had molecular studies by [employer] performed showing she is positive for K-ras G12 C mutation; however, targeted treatment for this mutation is only approved in the second line setting.   She is interested in getting a flu vaccine today.    MEDICAL HISTORY: Past Medical History:  Diagnosis Date   Atrial fibrillation ([place])    Complication of anesthesia    cannot take anectine   Hypertension    Inguinal hernia  ALLERGIES:  is allergic to codeine, hydrocodone, and succinylcholine chloride.  MEDICATIONS:  Current Outpatient Medications  Medication Sig Dispense Refill   apixaban (ELIQUIS) 2.5 MG TABS tablet Take 1 tablet (2.5 mg total) by mouth 2 (two) times daily. (Patient not taking: Reported on  08/02/2022) 60 tablet 0   metoprolol tartrate (LOPRESSOR) 25 MG tablet Take 0.5 tablets (12.5 mg total) by mouth 2 (two) times daily. 60 tablet 0   No current facility-administered medications for this visit.    SURGICAL HISTORY:  Past Surgical History:  Procedure Laterality Date   ABDOMINAL HYSTERECTOMY     DENTAL SURGERY  Jan 11, 2011   bone graft and extraction   EYE SURGERY     detached retina, cryo, lens implant   INGUINAL HERNIA REPAIR  09/27/2011   Procedure: HERNIA REPAIR INGUINAL INCARCERATED;  Surgeon: Pedro Earls, Gail;  Location: [facility];  Service: General;  Laterality: Left;  Left Ingunial hernia repair with mesh   INGUINAL HERNIA REPAIR Left 10/19/2012   Procedure: LAPAROSCOPIC possible open left femoral hernia repair ;  Surgeon: Pedro Earls, Gail;  Location: [facility];  Service: General;  Laterality: Left;  Laparoscopic Left Femoral Hernia Repair with Mesh   IR THORACENTESIS ASP PLEURAL SPACE W/IMG GUIDE  07/27/2022   IR THORACENTESIS ASP PLEURAL SPACE W/IMG GUIDE  08/02/2022   MOUTH SURGERY     lt upper bone graft for implants   TONSILLECTOMY     TUBAL LIGATION      REVIEW OF SYSTEMS:   Review of Systems  Constitutional: Positive for decreased appetite and weight loss.  Negative for chills, fever and unexpected weight change.  HENT:   Negative for mouth sores, nosebleeds, sore throat and trouble swallowing.   Eyes: Negative for eye problems and icterus.  Respiratory: Improved shortness of breath.  Negative for cough, hemoptysis, and wheezing.   Cardiovascular: Mild bilateral lower extremity swelling.  Negative for chest pain Gastrointestinal: Negative for abdominal pain, constipation, diarrhea, nausea and vomiting.  Genitourinary: Negative for bladder incontinence, difficulty urinating, dysuria, frequency and hematuria.   Musculoskeletal: Negative for back pain, gait problem, neck pain and neck stiffness.  Skin: Negative for itching and rash.   Neurological: Negative for dizziness, extremity weakness, gait problem, headaches, light-headedness and seizures.  Hematological: Negative for adenopathy. Does not bruise/bleed easily.  Psychiatric/Behavioral: Negative for confusion, depression and sleep disturbance. The patient is not nervous/anxious.     PHYSICAL EXAMINATION:  There were no vitals taken for this visit.  ECOG PERFORMANCE STATUS: 1  Physical Exam  Constitutional: Oriented to person, place, and time and thin appearing female and in no distress.  HENT:  Head: Normocephalic and atraumatic.  Mouth/Throat: Oropharynx is clear and moist. No oropharyngeal exudate.  Eyes: Conjunctivae are normal. Right eye exhibits no discharge. Left eye exhibits no discharge. No scleral icterus.  Neck: Normal range of motion. Neck supple.  Cardiovascular: Normal rate, regular rhythm, normal heart sounds and intact distal pulses.   Pulmonary/Chest: Effort normal. Quiet breath sounds in right lung. No respiratory distress. No wheezes. No rales.  Abdominal: Soft. Bowel sounds are normal. Exhibits no distension and no mass. There is no tenderness.  Musculoskeletal: Normal range of motion.  Bilateral lower extremity swelling. Lymphadenopathy:    No cervical adenopathy.  Neurological: Alert and oriented to person, place, and time. Exhibits muscle wasting. Gait normal. Coordination normal.  Skin: Skin is warm and dry. No rash noted. Not diaphoretic. No erythema. No pallor.  Psychiatric: Mood, memory  and judgment normal.  Vitals reviewed.  LABORATORY DATA: Lab Results  Component Value Date   WBC 10.1 08/02/2022   HGB 14.0 08/02/2022   HCT 40.6 08/02/2022   MCV 95.3 08/02/2022   PLT 519 (H) 08/02/2022      Chemistry      Component Value Date/Time   NA 133 (L) 08/02/2022 1029   K 4.2 08/02/2022 1029   CL 100 08/02/2022 1029   CO2 25 08/02/2022 1029   BUN 12 08/02/2022 1029   CREATININE 0.64 08/02/2022 1029      Component Value  Date/Time   CALCIUM 9.2 08/02/2022 1029   ALKPHOS 137 (H) 08/02/2022 1029   AST 19 08/02/2022 1029   ALT 15 08/02/2022 1029   BILITOT 0.4 08/02/2022 1029       RADIOGRAPHIC STUDIES:  DG Chest 1 View  Result Date: 08/09/2022 CLINICAL DATA:  Pleural effusion, status post thoracentesis. EXAM: CHEST  1 VIEW COMPARISON:  08/02/2022 FINDINGS: Stable cardiac contours. Aortic tortuosity. Interval decrease in the size of a previously noted right pleural effusion, now small to moderate, with improved aeration in the right lower lung. Right basilar airspace opacities, most likely associated atelectasis. The left lung is clear. No definite pneumothorax. No focal pulmonary opacity. No pleural effusion or pneumothorax. No acute osseous abnormality. IMPRESSION: Interval decrease in the size of a previously noted right pleural effusion, now small to moderate, with improved aeration in the right lower lung. No definite pneumothorax. Electronically Signed   By: Merilyn Baba M.D.   On: 08/09/2022 13:42   IR THORACENTESIS ASP PLEURAL SPACE W/IMG GUIDE  Result Date: 08/02/2022 INDICATION: Lung cancer, recurrent pleural effusion EXAM: ULTRASOUND GUIDED RIGHT THORACENTESIS MEDICATIONS: None. COMPLICATIONS: None immediate. PROCEDURE: An ultrasound guided thoracentesis was thoroughly discussed with the patient and questions answered. The benefits, risks, alternatives and complications were also discussed. The patient understands and wishes to proceed with the procedure. Written consent was obtained. Ultrasound was performed to localize and mark an adequate pocket of fluid in the right chest. The area was then prepped and draped in the normal sterile fashion. 1% Lidocaine was used for local anesthesia. Under ultrasound guidance a 6 Fr Safe-T-Centesis catheter was introduced. Thoracentesis was performed. The catheter was removed and a dressing applied. FINDINGS: A total of approximately 1.1L of pleural fluid was removed.  Samples were sent to the laboratory as requested by the clinical team. IMPRESSION: Successful ultrasound guided right thoracentesis yielding 1.1L of pleural fluid. Electronically Signed   By: Ruthann Cancer M.D.   On: 08/02/2022 16:25   DG Chest 1 View  Result Date: 08/02/2022 CLINICAL DATA:  Post thoracentesis. EXAM: CHEST  1 VIEW COMPARISON:  Chest radiographs 07/27/2022 and 07/16/2022. PET-CT 07/29/2022. FINDINGS: 1401 hours. The heart size and mediastinal contours are stable with aortic tortuosity. Right pleural effusion appears mildly decreased in volume. Unchanged right basilar airspace opacities. No evidence of pneumothorax. The left lung is clear. IMPRESSION: Decreased right pleural effusion following thoracentesis. No evidence of pneumothorax. Electronically Signed   By: Richardean Sale M.D.   On: 08/02/2022 14:23   NM PET Image Initial (PI) Skull Base To Thigh  Result Date: 07/31/2022 CLINICAL DATA:  Initial treatment strategy for cancer. EXAM: NUCLEAR MEDICINE PET SKULL BASE TO THIGH TECHNIQUE: 5.4 mCi F-18 FDG was injected intravenously. Full-ring PET imaging was performed from the skull base to thigh after the radiotracer. CT data was obtained and used for attenuation correction and anatomic localization. Fasting blood glucose: 99 mg/dl COMPARISON:  Chest  CT dated July 12, 2022 FINDINGS: Mediastinal blood pool activity: SUV max 2.4 Liver activity: SUV max 2.7 NECK: No hypermetabolic lymph nodes in the neck. Incidental CT findings: None. CHEST: Focal masslike hypermetabolic uptake within the right lower lobe with SUV max of 16.5, difficult to measure on CT due to associated atelectasis lack of IV contrast but measures proximally 4.5 x 3.4 cm on series 4, image 81. Additional area of focal masslike hypermetabolic uptake is seen in the right middle lobe SUV max of 20.1 measuring approximately 2.5 x 1.5 cm. Loculated moderate pleural effusion with nodular hypermetabolic activity of the pleura of  the right hemithorax. Hypermetabolic activity of the posterior right chest wall. Hypermetabolic right hilar and mediastinal lymph nodes. Reference hypermetabolic and enlarged right hilar lymph node measuring 1.1 cm in short axis on series 4, image 70 with SUV max of 11.4. Reference hypermetabolic retrotracheal lymph node measuring 8 mm in short axis on series 4, image 54 with SUV max of 5.9. Incidental CT findings: Moderate atherosclerotic disease of the thoracic aorta. Scattered small solid pulmonary nodules which are below PET-CT resolution. Reference left upper lobe solid nodule measuring 3 mm on series 7, image 19, unchanged in size when compared with the prior. ABDOMEN/PELVIS: No abnormal hypermetabolic activity within the liver, pancreas, adrenal glands, or spleen. No hypermetabolic lymph nodes in the abdomen or pelvis. Incidental CT findings: Diverticulosis. Exophytic simple appearing cyst of the posterior right lobe of the liver. Pelvic right kidney. SKELETON: Focal hypermetabolic uptake of the posterior first left rib with no definite osseous lesion seen on CT. Incidental CT findings: None. IMPRESSION: 1. Focal masslike hypermetabolic uptake within the right lower lobe and right middle lobe, compatible with primary lung malignancy. 2. Loculated moderate right pleural effusion with nodular hypermetabolic activity of the pleura, consistent with pleural metastatic disease. 3. Hypermetabolic right hilar and mediastinal lymph nodes, consistent with metastatic disease. 4. Focal hypermetabolic uptake of the posterior first left rib with no definite osseous lesion seen on CT, concerning for osseous metastatic disease. 5. No evidence of metastatic disease in the abdomen or pelvis. 6. Soft tissue uptake of the right chest wall, likely due to recent thoracentesis. Electronically Signed   By: Yetta Glassman M.D.   On: 07/31/2022 16:55   IR THORACENTESIS ASP PLEURAL SPACE W/IMG GUIDE  Result Date:  07/27/2022 INDICATION: Lung cancer with recurrent right pleural effusion. Request for diagnostic and therapeutic thoracentesis. EXAM: ULTRASOUND GUIDED RIGHT THORACENTESIS MEDICATIONS: 1% lidocaine 6 mL COMPLICATIONS: None immediate. PROCEDURE: An ultrasound guided thoracentesis was thoroughly discussed with the patient and questions answered. The benefits, risks, alternatives and complications were also discussed. The patient understands and wishes to proceed with the procedure. Written consent was obtained. Ultrasound was performed to localize and mark an adequate pocket of fluid in the right chest. The area was then prepped and draped in the normal sterile fashion. 1% Lidocaine was used for local anesthesia. Under ultrasound guidance a 6 Fr Safe-T-Centesis catheter was introduced. Thoracentesis was performed. The catheter was removed and a dressing applied. FINDINGS: A total of approximately 1.7 L of amber fluid was removed. Samples were sent to the laboratory as requested by the clinical team. IMPRESSION: Successful ultrasound guided right thoracentesis yielding 1.7 L of pleural fluid. No pneumothorax on post-procedure chest x-ray. Procedure performed by: Gareth Eagle, PA-C Electronically Signed   By: Ruthann Cancer M.D.   On: 07/27/2022 16:47   DG Chest 1 View  Result Date: 07/27/2022 CLINICAL DATA:  Right pleural effusion.  Status post thoracentesis. EXAM: CHEST  1 VIEW COMPARISON:  Chest radiograph 07/16/2022 FINDINGS: The cardiomediastinal silhouette is unchanged. There is a small to moderate residual right pleural effusion with basilar atelectasis. No residual pneumothorax is evident. The left lung is clear. No acute osseous abnormality is seen. IMPRESSION: Small to moderate residual right pleural effusion with basilar atelectasis. No pneumothorax. Electronically Signed   By: Logan Bores M.D.   On: 07/27/2022 13:40   VAS [place] LOWER EXTREMITY VENOUS (DVT)  Result Date: 07/16/2022  Lower Venous DVT  Study Patient Name:  ALEXXA SABET  Date of Exam:   07/16/2022 Medical Rec #: [id number]           Accession #:    [id number] Date of Birth: 1933/08/14          Patient Gender: F Patient Age:   14 years Exam Location:  [facility] Procedure:      VAS [place] LOWER EXTREMITY VENOUS (DVT) Referring Phys: Vance Gather --------------------------------------------------------------------------------  Indications: Edema.  Risk Factors: Atrial fibrillation. Comparison Study: No prior Performing Technologist: Sharion Dove RVS  Examination Guidelines: A complete evaluation includes B-mode imaging, spectral Doppler, color Doppler, and power Doppler as needed of all accessible portions of each vessel. Bilateral testing is considered an integral part of a complete examination. Limited examinations for reoccurring indications may be performed as noted. The reflux portion of the exam is performed with the patient in reverse Trendelenburg.  +--------+---------------+---------+-----------+----------------+-------------+ RIGHT   CompressibilityPhasicitySpontaneityProperties      Thrombus                                                                 Aging         +--------+---------------+---------+-----------+----------------+-------------+ CFV     Full                               pulsatile                                                                waveforms                     +--------+---------------+---------+-----------+----------------+-------------+ SFJ     Full                                                             +--------+---------------+---------+-----------+----------------+-------------+ FV Prox Full                                                             +--------+---------------+---------+-----------+----------------+-------------+ FV Mid  Full                                                              +--------+---------------+---------+-----------+----------------+-------------+  FV      Full                                                             Distal                                                                   +--------+---------------+---------+-----------+----------------+-------------+ PFV     Full                                                             +--------+---------------+---------+-----------+----------------+-------------+ POP     Full                               pulsatile                                                                waveforms                     +--------+---------------+---------+-----------+----------------+-------------+ PTV     Full                                                             +--------+---------------+---------+-----------+----------------+-------------+ PERO    Full                                                             +--------+---------------+---------+-----------+----------------+-------------+   +--------+---------------+---------+-----------+----------------+-------------+ LEFT    CompressibilityPhasicitySpontaneityProperties      Thrombus                                                                 Aging         +--------+---------------+---------+-----------+----------------+-------------+ CFV     Full                               pulsatile  waveforms                     +--------+---------------+---------+-----------+----------------+-------------+ SFJ     Full                                                             +--------+---------------+---------+-----------+----------------+-------------+ FV Prox Full                                                             +--------+---------------+---------+-----------+----------------+-------------+ FV Mid  Full                                                              +--------+---------------+---------+-----------+----------------+-------------+ FV      Full                                                             Distal                                                                   +--------+---------------+---------+-----------+----------------+-------------+ PFV     Full                                                             +--------+---------------+---------+-----------+----------------+-------------+ POP     Full                               pulsatile                                                                waveforms                     +--------+---------------+---------+-----------+----------------+-------------+ PTV     Full                                                             +--------+---------------+---------+-----------+----------------+-------------+  PERO    Full                                                             +--------+---------------+---------+-----------+----------------+-------------+     Summary: BILATERAL: - No evidence of deep vein thrombosis seen in the lower extremities, bilaterally. -No evidence of popliteal cyst, bilaterally. RIGHT: pulsatile waveforms  LEFT: Pulsatile waveforms.  *See table(s) above for measurements and observations. Electronically signed by Monica Martinez Gail on 07/16/2022 at 1:14:13 PM.    Final    DG Chest Port 1 View  Result Date: 07/16/2022 CLINICAL DATA:  [id number] Pneumohemothorax [id number] EXAM: PORTABLE CHEST 1 VIEW COMPARISON:  07/15/2022 FINDINGS: Moderate right pneumothorax again noted, stable. Right basilar opacity with layering right effusion. Left lung clear. Heart is normal size. Aortic atherosclerosis. No acute bony abnormality. IMPRESSION: Stable moderate-sized right pneumothorax. Right effusion with right basilar atelectasis or infiltrate. No real change. Electronically Signed    By: Rolm Baptise M.D.   On: 07/16/2022 08:26   DG CHEST PORT 1 VIEW  Result Date: 07/15/2022 CLINICAL DATA:  Pleural effusion. EXAM: PORTABLE CHEST 1 VIEW COMPARISON:  July 14, 2022. FINDINGS: The heart size and mediastinal contours are within normal limits. Left lung is clear. Stable right basilar atelectasis or infiltrate is noted with associated pleural effusion. Grossly stable right apical pneumothorax. The visualized skeletal structures are unremarkable. IMPRESSION: Stable right basilar opacity as described above. Stable moderate size right apical pneumothorax. Electronically Signed   By: Marijo Conception M.D.   On: 07/15/2022 08:42   DG Chest 1 View  Result Date: 07/14/2022 CLINICAL DATA:  Follow-up pneumothorax. EXAM: CHEST  1 VIEW COMPARISON:  07/14/2022 at 6:18 a.m. FINDINGS: Unchanged appearance of right-sided pneumothorax. This measures 3.3 cm over the right apex. Previously this measured 4.0 cm. Decreased aeration to the right lung base compatible with atelectasis. Unchanged small right pleural effusion. IMPRESSION: 1. Similar size of moderate right-sided pneumothorax 2. Persistent small right pleural effusion and right lower lobe atelectasis. Electronically Signed   By: Kerby Moors M.D.   On: 07/14/2022 13:14   DG Chest Port 1 View  Result Date: 07/14/2022 CLINICAL DATA:  88 year old female with pneumothorax following thoracentesis for large right pleural effusion. Chest tube removed yesterday. EXAM: PORTABLE CHEST 1 VIEW COMPARISON:  Portable chest 07/13/2022 and earlier. FINDINGS: Portable AP upright view at 0618 hours. Stable up to moderate size right pneumothorax since yesterday following chest tube removal. Fairly small volume residual layering right pleural effusion does not appear significantly changed. Some lower lung atelectasis. Stable cardiac size and mediastinal contours. Left lung appears stable and negative. Stable visualized osseous structures. Negative visible bowel  gas. IMPRESSION: 1. Stable up to moderate size right pneumothorax since yesterday after chest tube removal. Small volume residual right pleural effusion. 2. No new cardiopulmonary abnormality. Electronically Signed   By: Genevie Ann M.D.   On: 07/14/2022 08:31   DG CHEST PORT 1 VIEW  Result Date: 07/13/2022 CLINICAL DATA:  Chest tube removal. EXAM: PORTABLE CHEST 1 VIEW COMPARISON:  Radiograph earlier today FINDINGS: Removal of right pigtail catheter. The right pneumothorax has increased in size from prior exam, currently 3.4 cm when measured from under the right first rib, previously 1.3 cm. There is likely a basilar component that is also new. There is  increasing opacity at the right lung base. Stable heart size and mediastinal contours. No acute findings in the left lung. IMPRESSION: 1. Increased size of right pneumothorax after removal of right pigtail catheter, small to moderate in size. 2. Increasing opacity at the right lung base. These results will be called to the ordering clinician or representative by the Radiologist Assistant, and communication documented in the PACS or [employer]. Electronically Signed   By: Keith Rake M.D.   On: 07/13/2022 20:06   DG Chest Port 1 View  Result Date: 07/13/2022 CLINICAL DATA:  Follow-up chest tube placement EXAM: PORTABLE CHEST 1 VIEW COMPARISON:  07/12/2022 FINDINGS: Unchanged right-sided pigtail thoracostomy tube. Small right apical pneumothorax appears unchanged. Stable cardiomediastinal contour. Asymmetric opacity within the right base is unchanged from previous exam. Left lung clear. IMPRESSION: 1. Stable position of right sided thoracostomy tube with persistent small right apical pneumothorax. 2. No change in right lower lung opacity. Electronically Signed   By: Kerby Moors M.D.   On: 07/13/2022 08:47   CT CHEST WO CONTRAST  Result Date: 07/12/2022 CLINICAL DATA:  Pleural effusion, malignancy suspected EXAM: CT CHEST WITHOUT CONTRAST  TECHNIQUE: Multidetector CT imaging of the chest was performed following the standard protocol without IV contrast. RADIATION DOSE REDUCTION: This exam was performed according to the departmental dose-optimization program which includes automated exposure control, adjustment of the mA and/or kV according to patient size and/or use of iterative reconstruction technique. COMPARISON:  Radiographs earlier today FINDINGS: Cardiovascular: Aortic and coronary artery atherosclerotic calcification. Normal heart size. No pericardial effusion. Mediastinum/Nodes: No thoracic adenopathy by size noting limitations of noncontrast exam. 1.1 cm right thyroid nodule. No follow-up recommended. Mildly patulous esophagus with some frothy fluid reaching the thoracic inlet. Lungs/Pleura: Persistent right apical and basilar small volume pneumothorax. Right pleural pigtail catheter along the right minor fissure. Small left pleural effusion. Nodular thickening of the posterior right pleura with focal masslike airspace opacity measuring 3.7 by 2.3 cm. Nodular consolidation along the right minor fissure measuring 3.9 x 1.7 cm. Scattered nodular interlobular septal thickening throughout the right lung. Patchy ground-glass opacities in the right upper and middle lobe. The left lung is clear. 3 mm pulmonary nodule in the lateral left upper lobe (series 4/image 57). Upper Abdomen: Low-density fluid collection posterior to the right hepatic lobe measures 3.8 x 2.4 cm. Musculoskeletal: No chest wall mass or evidence of osseous metastases. Subcutaneous gas along the right lower posterior chest wall. IMPRESSION: Persistent right apical and basilar pneumothorax despite thoracostomy tube. Nodular interlobular septal thickening with more focal areas of nodular consolidation along the right minor fissure and posterior right lower lobe. Findings are concerning for malignancy with lymphangitic spread. Small right pleural effusion. Possible loculated  subpulmonic effusion versus right lower lobe mass along the posterior right lower lobe. Indeterminate low-density fluid collection posterior to the right hepatic lobe. Electronically Signed   By: Placido Sou M.D.   On: 07/12/2022 21:59   DG CHEST PORT 1 VIEW  Result Date: 07/12/2022 CLINICAL DATA:  RIGHT pleural effusion EXAM: PORTABLE CHEST 1 VIEW COMPARISON:  Portable exam 0633 hours compared to 07/11/2022 FINDINGS: Pigtail RIGHT thoracostomy tube unchanged. Normal heart size, mediastinal contours, and pulmonary vascularity. Aorta Persistent small RIGHT apex pneumothorax despite thoracostomy tube. Associated mild RIGHT basilar atelectasis and questionable small sub pulmonic effusion. No acute infiltrate or acute osseous findings. IMPRESSION: Persistent small RIGHT apex pneumothorax and RIGHT basilar atelectasis with questionable small subpulmonic RIGHT pleural effusion. Electronically Signed   By: Elta Guadeloupe  Thornton Papas M.D.   On: 07/12/2022 10:01   DG Chest Port 1 View  Result Date: 07/11/2022 CLINICAL DATA:  Follow-up chest 2 EXAM: PORTABLE CHEST 1 VIEW COMPARISON:  07/10/2022 FINDINGS: Right chest tube remains in place. Small amount of pleural air at the apex persists, less than 5%. Some atelectasis persists in the right lower lung. Chronic aortic atherosclerosis. No new finding. IMPRESSION: No significant change. Right chest tube remains in place. Small amount of pleural air at the apex persists, less than 5%. Electronically Signed   By: Nelson Chimes M.D.   On: 07/11/2022 08:18   DG Chest Port 1 View  Result Date: 07/10/2022 CLINICAL DATA:  Right pleural effusion, placement of right chest tube EXAM: PORTABLE CHEST 1 VIEW COMPARISON:  Previous studies including the examination done earlier today FINDINGS: Transverse diameter of heart is slightly increased. Thoracic aorta is tortuous and ectatic. There are no signs of pulmonary edema. There is improvement in aeration of right mid and right lower lung fields  after placement of right chest tube suggesting significant interval decrease in right pleural effusion. There is small right apical pneumothorax measuring 5 mm in diameter. Right lung remains well expanded. Residual increased markings are seen in right lower lung field. IMPRESSION: There is marked interval decrease in right pleural effusion after placement of right chest tube. Small right apical pneumothorax is seen. Electronically Signed   By: Elmer Picker M.D.   On: 07/10/2022 17:25     ASSESSMENT/PLAN:  Stage IV non-small cell lung cancer (T1, N2, M1a) adenocarcinoma.  The patient presented with a masslike hypermetabolic lesion in the right lower lobe and right middle lobe compatibility with primary lung malignancy.  She also has a malignant right pleural effusion with nodular hypermetabolic activity of the pleura consistent with pleural metastatic disease.  She also has hypermetabolic right hilar and mediastinal lymph nodes.  She was diagnosed in November 2023  Molecular studies show that she is positive for K-ras G12 C mutation which can be used in the second line setting for treatment.  PDL1 expression pending and requested today on 08/10/2022.  We will also ask that cytology be obtained on upcoming thoracentesis on 12/11 in the event there is not enough cellularity to run PD-L1 on the results from 07/09/2022.  She is scheduled for a brain MRI on 08/12/2022.  I discussed with the patient and her patient advocate that this is required to complete the staging workup and I would highly recommend she keep this appointment as scheduled.  Dr. Julien Nordmann had a lengthly discussion with the patient today about her current condition and treatment options.  The patient is extremely reluctant to consider chemotherapy.  Dr. Julien Nordmann reviewed the following options with the patient.  The first option is to not undergo any treatment and consider palliative care/hospice.  The second option is to undergo treatment  with chemotherapy with carboplatin, Alimta, and immunotherapy with either Keytruda/Libtayo IV every 3 weeks.  The patient is very interested in immunotherapy. Dr. Julien Nordmann had a discussion with the patient today that the treatment options with immunotherapy depend on her PD-L1 expression.  If her PD-L1 expression is greater than 50%, she may undergo treatment with single agent chemotherapy agent with either Keytruda or Libtayo IV every 3 weeks.  If her PD-L1 expression is 1% or greater, she can be treated with combination immunotherapy with nivolumab 360 mg IV every 3 weeks and ipilimumab 1 mg/kg IV every 6 weeks.  If her PD-L1 expression is 0, she will  require 2 rounds of chemotherapy with carboplatin and Alimta in addition to nivolumab and ipilimumab.  If she has a low-level PD-L1, Dr. Julien Nordmann still strongly encouraged the patient to consider 2 cycles of chemotherapy in addition to the immunotherapy.   We expect the results of her PD-L1 in the next 7 to 10 days if able to run this on cytology from 11/4.  Dr. Julien Nordmann discussed the adverse side effects of chemotherapy including fatigue, myelosuppression, nausea, vomiting, kidney, and liver dysfunction. We discussed with her the adverse effect of the immunotherapy including but not limited to immunotherapy mediated skin rash, diarrhea, inflammation of the lung, kidney, liver, thyroid or other endocrine dysfunction  The patient wants to think about her options.  She will call us if she decides not to proceed with treatment.  Will tentatively make arrangements to start treatment next week on 08/17/2022 with carboplatin, Alimta, ipilimumab, and nivolumab.  Dr. Julien Nordmann will reduce the dose of carboplatin to an AUC of 4 and Alimta 400 mg/m2 due to tolerability concerns.  She comes back with high PD-L1 expression, we can always cancel the chemotherapy worsening of her treatment in the future.   I will arrange for the patient to have a chemoeducation class prior to  receiving her first cycle of chemotherapy.   We will arrange for the patient to have a B12 injection while in the clinic today.  Will also arrange for her to have a flu vaccine.   I sent prescriptions for 1 mg folic acid p.o. daily as well as Compazine 10 mg every 6 hours as needed for nausea.   The patient will follow-up in 2 weeks for a one-week follow-up visit after completing her first cycle of chemotherapy.  Patient is meeting with the social worker today.  Dr. Julien Nordmann and I had a lengthy discussion with the patient today about her recurrent effusions.  Dr. Julien Nordmann discussed the logistics of the Pleurx catheter with the patient.  The patient is reluctant to consider this due to the risks associated with this.  Dr. Julien Nordmann also discussed there are risks associated with repeat thoracenteses such as pneumothorax and infection.  It is unrealistic to have long-term standing orders for repeat thoracenteses as we cannot always predict the availability of interventional radiology as well as the timeframe in which her effusions recur.  Would be concerned about recurrent emergency room evaluations if unable to obtain patient thoracentesis.  Therefore, Dr. Julien Nordmann discussed this is the benefit of having a Pleurx catheter.  The patient and her advocate would be interested in at least a consultation with pulmonary medicine for possible Pleurx catheter placement before making the final decision.   The patient may be interested in seeking a second opinion.  Discussed that we are supportive if she wishes to have a second opinion.  The patient was advised to call immediately if she has any concerning symptoms in the interval. The patient voices understanding of current disease status and treatment options and is in agreement with the current care plan. All questions were answered. The patient knows to call the clinic with any problems, questions or concerns. We can certainly see the patient much sooner if  necessary    No orders of the defined types were placed in this encounter.    Vaibhav Fogleman L Addilynn Mowrer, PA-C 08/09/22  ADDENDUM: Hematology/Oncology Attending: I had a face-to-face encounter with the patient today.  I reviewed her records, lab, scan and recommended her care plan.  This is a very pleasant 88 years  old white female who is here today to establish care with me.  The patient was recently diagnosed with a stage IV non-small cell lung cancer (T1c, N2, M1 a) non-small cell lung cancer, adenocarcinoma presented with masslike hypermetabolic consolidation in the right lower lobe as well as right middle lobe in addition to right hilar and mediastinal lymphadenopathy and malignant right pleural effusion with nodular hypermetabolic pleural metastasis diagnosed in November 2023.  The patient had molecular studies that showed positive KRAS G12C mutation. She has been requiring frequent thoracentesis at regular basis and she was very reluctant to consider Pleurx catheter placement. I had a lengthy discussion with a very extended visit with the patient and her caregiver [name] about her current condition and treatment options.  I had to explain to the patient her disease condition and very details as well as the possible treatment options including palliative care and hospice versus consideration of treatment with single agent immunotherapy if she has high PD-L1 expression or a combination of low-dose and few cycles of chemotherapy with carboplatin and Alimta for 2 cycles with ipilimumab and nivolumab as an alternative if she has negative PD-L1 expression.  The patient has no interest in chemotherapy but she may consider the 2 cycles with the reduced dose. I also explained to the patient that treatment with K-ras mutation inhibitors is only available right now as a second line option. After extensive discussion the patient is considering to have her treatment with reduced dose carboplatin, Alimta,  ipilimumab and nivolumab on the calendar but she may change her mind and cancel it. I discussed with the patient the adverse effect of this treatment including but not limited to mild alopecia, myelosuppression, nausea and vomiting, peripheral neuropathy, liver or renal dysfunction as well as immunotherapy adverse effects. I also strongly encouraged the patient to consider Pleurx catheter placement for drainage of the frequent reaccumulation of the pleural effusion and she was seen by Dr. Elsworth Soho in the past and he can do this procedure for her but if he is not available, we could refer the patient to one of his partner, cardiothoracic surgery or interventional radiology for the Pleurx catheter placement.  She is interested in discussion of this option with the pulmonologist before making a decision.  She is expected to have another ultrasound-guided thoracentesis early next week. We will send the tissue block from the pleural fluid cytology for PD-L1 expression if available from the previous 1 or from the upcoming one next week.  If the PD-L1 expression is elevated over 50%, we may consider discontinuing her chemotherapy and proceed with single agent immunotherapy or a combination of ipilimumab and nivolumab if it is in the range of 1-49%. The patient was also seen by the thoracic navigator as well as Education officer, museum. We will see her back for follow-up visit 1 week after the start of her treatment. The patient was advised to call immediately if she has any other concerning symptoms in the interval. The total time spent in the appointment was 90 minutes. Disclaimer: This note was dictated with voice recognition software. Similar sounding words can inadvertently be transcribed and may be missed upon review. Eilleen Kempf, Gail

## 2022-08-09 NOTE — Procedures (Signed)
PROCEDURE SUMMARY:  Successful image-guided right thoracentesis. Yielded 2.0L of yellow fluid. Pt tolerated procedure well. No immediate complications. EBL = trace   Specimen was not sent for labs. CXR ordered.  Please see imaging section of Epic for full dictation.  Lura Em PA-C 08/09/2022 1:32 PM

## 2022-08-09 NOTE — Telephone Encounter (Signed)
Contacted patient to scheduled appointments. Patient is aware of appointments that are scheduled.   

## 2022-08-09 NOTE — Telephone Encounter (Signed)
No additional notes required for this encounter

## 2022-08-10 ENCOUNTER — Telehealth: Payer: Self-pay

## 2022-08-10 ENCOUNTER — Inpatient Hospital Stay: Payer: Medicare Other | Admitting: Licensed Clinical Social Worker

## 2022-08-10 ENCOUNTER — Inpatient Hospital Stay: Payer: Medicare Other

## 2022-08-10 ENCOUNTER — Other Ambulatory Visit: Payer: Self-pay

## 2022-08-10 ENCOUNTER — Telehealth: Payer: Self-pay | Admitting: Pulmonary Disease

## 2022-08-10 ENCOUNTER — Inpatient Hospital Stay: Payer: Medicare Other | Attending: Nurse Practitioner | Admitting: Physician Assistant

## 2022-08-10 VITALS — BP 158/79 | HR 63 | Temp 97.7°F | Resp 13 | Wt 100.6 lb

## 2022-08-10 DIAGNOSIS — Z5112 Encounter for antineoplastic immunotherapy: Secondary | ICD-10-CM | POA: Insufficient documentation

## 2022-08-10 DIAGNOSIS — C3491 Malignant neoplasm of unspecified part of right bronchus or lung: Secondary | ICD-10-CM

## 2022-08-10 DIAGNOSIS — C3431 Malignant neoplasm of lower lobe, right bronchus or lung: Secondary | ICD-10-CM | POA: Insufficient documentation

## 2022-08-10 DIAGNOSIS — I10 Essential (primary) hypertension: Secondary | ICD-10-CM

## 2022-08-10 DIAGNOSIS — Z87891 Personal history of nicotine dependence: Secondary | ICD-10-CM | POA: Diagnosis not present

## 2022-08-10 DIAGNOSIS — Z5111 Encounter for antineoplastic chemotherapy: Secondary | ICD-10-CM | POA: Diagnosis not present

## 2022-08-10 DIAGNOSIS — R599 Enlarged lymph nodes, unspecified: Secondary | ICD-10-CM

## 2022-08-10 DIAGNOSIS — Z7189 Other specified counseling: Secondary | ICD-10-CM | POA: Insufficient documentation

## 2022-08-10 DIAGNOSIS — Z23 Encounter for immunization: Secondary | ICD-10-CM | POA: Diagnosis not present

## 2022-08-10 DIAGNOSIS — J910 Malignant pleural effusion: Secondary | ICD-10-CM | POA: Insufficient documentation

## 2022-08-10 DIAGNOSIS — Z79899 Other long term (current) drug therapy: Secondary | ICD-10-CM | POA: Diagnosis not present

## 2022-08-10 DIAGNOSIS — I4891 Unspecified atrial fibrillation: Secondary | ICD-10-CM | POA: Diagnosis not present

## 2022-08-10 MED ORDER — CYANOCOBALAMIN 1000 MCG/ML IJ SOLN
1000.0000 ug | Freq: Once | INTRAMUSCULAR | Status: AC
  Administered 2022-08-10: 1000 ug via INTRAMUSCULAR
  Filled 2022-08-10: qty 1

## 2022-08-10 MED ORDER — FOLIC ACID 1 MG PO TABS: 1.0000 mg | ORAL_TABLET | Freq: Every day | ORAL | 2 refills | Status: DC

## 2022-08-10 MED ORDER — INFLUENZA VAC A&B SA ADJ QUAD 0.5 ML IM PRSY
0.5000 mL | PREFILLED_SYRINGE | Freq: Once | INTRAMUSCULAR | Status: AC
  Administered 2022-08-10: 0.5 mL via INTRAMUSCULAR
  Filled 2022-08-10: qty 0.5

## 2022-08-10 NOTE — Progress Notes (Signed)
START OFF PATHWAY REGIMEN - Non-Small Cell Lung   [id number]:Carboplatin IV D1,22 + Pemetrexed IV D1,22 + Ipilimumab IV D1 + Nivolumab IV D1,22 q42 Days x 1 Cycle Followed by Ipilimumab IV D1 + Nivolumab IV D1,22 q42 Days:   Cycle 1: A cycle is 42 days:     Nivolumab      Ipilimumab      Carboplatin      Pemetrexed    Cycles 2 and beyond: A cycle is every 42 days:     Nivolumab      Ipilimumab   **Always confirm dose/schedule in your pharmacy ordering system**  Patient Characteristics: Stage IV Metastatic, Nonsquamous, Molecular Analysis Completed, Molecular Alteration Present and Targeted Therapy Exhausted OR EGFR Exon 20+ or KRAS G12C+ or HER2+ Present and No Prior Chemo/Immunotherapy OR No Alteration Present, Initial  Chemotherapy/Immunotherapy, PS = 0, 1, BRAF/MET/KRAS/HER2 Mutation Positive, Candidate for Immunotherapy, PD-L1 Expression Positive 1-49% (TPS) / Negative / Not Tested / Awaiting Test Results and Immunotherapy Candidate Therapeutic Status: Stage IV Metastatic Histology: Nonsquamous Cell Broad Molecular Profiling Status: Molecular Analysis Completed Molecular Analysis Results: KRAS G12C Mutation Present and No Prior Chemo/Immunotherapy ECOG Performance Status: 1 Chemotherapy/Immunotherapy Line of Therapy: Initial Chemotherapy/Immunotherapy Immunotherapy Candidate Status: Candidate for Immunotherapy PD-L1 Expression Status: Awaiting Test Results Intent of Therapy: Non-Curative / Palliative Intent, Discussed with Patient

## 2022-08-10 NOTE — Telephone Encounter (Signed)
PCCM:  Please schedule appt with me next week.  Ok to work into a 15 mins slot to discuss pleurex placement   Thanks  [place], DO [facility] 08/10/2022 6:53 PM

## 2022-08-10 NOTE — Telephone Encounter (Signed)
This nurse spoke with [facility] and requested PDL1 testing to be completed on pleural fluid from Cytology specimen from 07/09/22.  No further questions or concerns at this time.

## 2022-08-10 NOTE — Progress Notes (Signed)
[facility] CSW Progress Note  Clinical Education officer, museum met with patient and her patient advocate [name] [phone]) to discuss how medical information is communicated and the necessity to have a direct contact number for the patient.   [name] is identified as pt's advocate and it is the patient's wish that booking appointments be done through her advocate.  CSW explained to pt the necessity for the medical team to have a direct telephone number to pt in order to reach her when medical information or results need to be communicated as medical results need to be reported directly to the patient.  CSW also explained the necessity for all conversations having to do with any aspect of pt's care be had with the patient as all of these conversations influence the patient's care.  It is imperative pt make informed decisions regarding treatment and the only way to ensure that is if the pt is present to receive all information.  Advanced Directives and the assignment of a Health Care Proxy also discussed.  Pt to see CSW 12/8 to complete Advanced Directives.      Henriette Combs, LCSW

## 2022-08-10 NOTE — Patient Instructions (Addendum)
Summary:  -There are two main categories of lung cancer, they are named based on the size of the cancer cell. One is called Non-Small cell lung cancer. The other type is Small Cell Lung Cancer -The sample (biopsy) that they took of your tumor was consistent with a subtype of Non-small cell lung cancer called Adenocarcinoma. This is the most common type of lung cancer.  -We covered a lot of important information at your appointment today. Here is the summary:  -The options include the following: 1) Hospice/Palliative Care: It is always an option to not pursue treatment at all.  2) The second option is 2 chemotherapy drugs + one immunotherapy drug once every 3 weeks.  3) The last option, depends on a special test called PDL1. This result comes as a number 0-100. If your number is between 1-50, you can get treatment with two immunotherapy drugs along once every 3 weeks. If your number is 50% or more, then you can take treatment with one immunotherapy medication alone. The problem is, if your number is 0 then we would have to do two rounds of mild chemo in addition to immunotherapy before you can be on immunotherapy alone. Once again, this is once every 3 weeks.  -If you are not wanting to wait for the results, Dr. Mohamed recommends either way arranging for the chemo/immunotherapy for the two rounds of chemo/immunotherapy before being on immunotherapy alone because it will control the cancer faster.   -Before your start your treatment, I would like you to attend a Chemotherapy Education Class. This involves having you sit down with one of our nurse educators. She will discuss with your one-on-one more details about your treatment as well as general information about resources here at the cancer center.  -Your treatment will be given once every 3 weeks. We will check your labs once a week for the first ~5 treatments just to make sure that important components of your blood are in an acceptable range -We will  get a CT scan after 3 treatments to check on the progress of treatment  Referrals or Imaging: -Please keep the appointment for the brain MRI on 12/8 as scheduled.   Medications: -I will send compazine to the pharmacy every 6 hours as needed for nausea and vomiting.  -Folic acid please take 1 tablet daily  -You will arrange for B12 shot today   Follow up:  -We will see you back for a follow up visit 1 week after your first treatment to see how it went and help manage any side effects of treatment that you may have   -If you need to reach us at any time, the main office number to the cancer center is [phone], when you call, ask to speak to either Cassie's or Dr. Mohamed's nurse.   

## 2022-08-11 ENCOUNTER — Ambulatory Visit (HOSPITAL_COMMUNITY): Payer: Medicare Other

## 2022-08-11 ENCOUNTER — Encounter: Payer: Self-pay | Admitting: General Practice

## 2022-08-11 NOTE — Progress Notes (Signed)
[place] Spiritual Care Note  Referred by social work as an additional layer of support. Reached Ms Lerch briefly by phone. She reports that she is feeling overwhelmed with health changes/uncertainty and appointments. We plan to meet in person when she is on campus in order to connect more.   [place], [place], [facility] Pager [phone] Voicemail [phone]

## 2022-08-11 NOTE — Telephone Encounter (Signed)
Attempted to call pt but unable to reach. Left message for her to return call. 

## 2022-08-12 ENCOUNTER — Other Ambulatory Visit: Payer: Self-pay

## 2022-08-12 ENCOUNTER — Ambulatory Visit (HOSPITAL_COMMUNITY)
Admission: RE | Admit: 2022-08-12 | Discharge: 2022-08-12 | Disposition: A | Discharge status: Home / Self Care | Payer: Medicare Other | Source: Ambulatory Visit | Attending: Nurse Practitioner | Admitting: Nurse Practitioner

## 2022-08-12 ENCOUNTER — Inpatient Hospital Stay: Payer: Medicare Other | Admitting: Licensed Clinical Social Worker

## 2022-08-12 ENCOUNTER — Telehealth: Payer: Self-pay | Admitting: Medical Oncology

## 2022-08-12 ENCOUNTER — Encounter: Payer: Self-pay | Admitting: General Practice

## 2022-08-12 ENCOUNTER — Encounter: Payer: Self-pay | Admitting: Internal Medicine

## 2022-08-12 ENCOUNTER — Other Ambulatory Visit: Payer: Self-pay | Admitting: Physician Assistant

## 2022-08-12 DIAGNOSIS — C3490 Malignant neoplasm of unspecified part of unspecified bronchus or lung: Secondary | ICD-10-CM | POA: Insufficient documentation

## 2022-08-12 DIAGNOSIS — C3491 Malignant neoplasm of unspecified part of right bronchus or lung: Secondary | ICD-10-CM | POA: Diagnosis not present

## 2022-08-12 DIAGNOSIS — Z8709 Personal history of other diseases of the respiratory system: Secondary | ICD-10-CM | POA: Diagnosis not present

## 2022-08-12 DIAGNOSIS — Z8673 Personal history of transient ischemic attack (TIA), and cerebral infarction without residual deficits: Secondary | ICD-10-CM | POA: Diagnosis not present

## 2022-08-12 DIAGNOSIS — I771 Stricture of artery: Secondary | ICD-10-CM | POA: Diagnosis not present

## 2022-08-12 MED ORDER — GADOBUTROL 1 MMOL/ML IV SOLN
4.5000 mL | Freq: Once | INTRAVENOUS | Status: AC | PRN
  Administered 2022-08-12: 4.5 mL via INTRAVENOUS

## 2022-08-12 NOTE — Telephone Encounter (Signed)
Called pt and there was no answer-LMTCB °

## 2022-08-12 NOTE — Progress Notes (Signed)
[place] Spiritual Care Note  Was able to meet Gail Jennings in person after a schedule change. Introduced Gail Jennings as part of her support team, including at treatment, which she prefers over phone. Built rapport, providing pastoral presence and information about support resources through her treatment and adjustment process.  We plan to follow up when she is on campus, and she knows to call as needed/desired.   [place], [place], [facility] Pager [phone] Voicemail [phone]

## 2022-08-12 NOTE — Progress Notes (Signed)
[facility] Social Work  Patient presented to Supportive Services  to review and complete healthcare advance directives.  Clinical Social Worker met with patient and friend [name]).  The patient designated Boykin Reaper [phone]) as their primary healthcare agent and Merrimon Sharlet [place] [phone]) as their secondary agent.  Patient also completed healthcare living will.  Pt's healthcare agent's reside out of state.  Pt's friend suggested pt give access to her healthcare agents to My Chart to ensure both are able to see her treatment plan and progress.  Pt also informed her healthcare agents can be present via speaker phone at any of her visits if she would like to include them in the conversation.  Documents were notarized and copies made for patient/family. Clinical Social Worker will send documents to medical records to be scanned into patient's chart.   Clinical Social Worker encouraged patient/family to contact with any additional questions or concerns.   Henriette Combs, [place] Worker [facility]

## 2022-08-12 NOTE — Telephone Encounter (Signed)
Received a fax today from 12/06- problem resolved on 12/6.

## 2022-08-12 NOTE — Telephone Encounter (Signed)
Appt was scheduled.

## 2022-08-12 NOTE — Progress Notes (Signed)
[place] Spiritual Care Note  Phoned briefly to let Gail Jennings know that I will miss her today due to a schedule conflict. We plan to follow up at a future appointment.   [place], [place], [facility] Pager [phone] Voicemail [phone]

## 2022-08-14 ENCOUNTER — Other Ambulatory Visit: Payer: Self-pay

## 2022-08-15 ENCOUNTER — Encounter: Payer: Self-pay | Admitting: Pulmonary Disease

## 2022-08-15 ENCOUNTER — Ambulatory Visit (INDEPENDENT_AMBULATORY_CARE_PROVIDER_SITE_OTHER): Payer: Medicare Other | Admitting: Pulmonary Disease

## 2022-08-15 ENCOUNTER — Telehealth: Payer: Self-pay | Admitting: Medical Oncology

## 2022-08-15 ENCOUNTER — Ambulatory Visit (HOSPITAL_COMMUNITY)
Admission: RE | Admit: 2022-08-15 | Discharge: 2022-08-15 | Disposition: A | Discharge status: Home / Self Care | Payer: Medicare Other | Source: Ambulatory Visit | Attending: Nurse Practitioner | Admitting: Nurse Practitioner

## 2022-08-15 ENCOUNTER — Ambulatory Visit (HOSPITAL_COMMUNITY): Payer: Medicare Other

## 2022-08-15 ENCOUNTER — Other Ambulatory Visit: Payer: Self-pay

## 2022-08-15 ENCOUNTER — Ambulatory Visit (HOSPITAL_COMMUNITY)
Admission: RE | Admit: 2022-08-15 | Discharge: 2022-08-15 | Disposition: A | Discharge status: Home / Self Care | Payer: Medicare Other | Source: Ambulatory Visit | Attending: Physician Assistant | Admitting: Physician Assistant

## 2022-08-15 ENCOUNTER — Encounter: Payer: Self-pay | Admitting: Nurse Practitioner

## 2022-08-15 VITALS — BP 142/67

## 2022-08-15 VITALS — BP 120/70 | HR 78 | Ht 61.0 in | Wt 99.4 lb

## 2022-08-15 DIAGNOSIS — C3491 Malignant neoplasm of unspecified part of right bronchus or lung: Secondary | ICD-10-CM

## 2022-08-15 DIAGNOSIS — J90 Pleural effusion, not elsewhere classified: Secondary | ICD-10-CM | POA: Diagnosis not present

## 2022-08-15 DIAGNOSIS — C3490 Malignant neoplasm of unspecified part of unspecified bronchus or lung: Secondary | ICD-10-CM | POA: Diagnosis not present

## 2022-08-15 DIAGNOSIS — R0600 Dyspnea, unspecified: Secondary | ICD-10-CM | POA: Insufficient documentation

## 2022-08-15 DIAGNOSIS — J910 Malignant pleural effusion: Secondary | ICD-10-CM

## 2022-08-15 DIAGNOSIS — Z9889 Other specified postprocedural states: Secondary | ICD-10-CM

## 2022-08-15 MED ORDER — LIDOCAINE HCL 1 % IJ SOLN
INTRAMUSCULAR | Status: AC
  Administered 2022-08-15: 10 mL
  Filled 2022-08-15: qty 20

## 2022-08-15 NOTE — Telephone Encounter (Signed)
Pt confirmed appts for 12/13. She prefers chairside education on day of tx.

## 2022-08-15 NOTE — Procedures (Signed)
PROCEDURE SUMMARY:  Successful US guided right thoracentesis. Yielded 1.3 of amber pleural fluid. Pt tolerated procedure well. No immediate complications.  Specimen not sent for labs. CXR ordered.  EBL < 5 mL  Nalla Purdy PA-C 08/15/2022 11:46 AM

## 2022-08-15 NOTE — Patient Instructions (Signed)
Thank you for visiting Dr. Valeta Harms at [facility]. Today we recommend the following:  See me after you start your chemo and let me know your thoughts on pleurex catheter placement   Return in about 4 weeks (around 09/12/2022) for with APP or Dr. Valeta Harms.    Please do your part to reduce the spread of COVID-19.

## 2022-08-15 NOTE — Progress Notes (Signed)
Synopsis: Referred in December 2023 pleural effusion, stage IV adenocarcinoma by Alla Feeling, NP  Subjective:   PATIENT ID: [name] GENDER: female DOB: [birth year], MRN: [id number]  Chief Complaint  Patient presents with   Consult    This is a 88 year old female, past medical history of atrial fibrillation on Eliquis, hypertension.  Patient is a former smoker quit 51 years ago in 1972.  Patient presented to the hospital with shortness of breath found to have a large pleural effusion and mass in the chest.  Was sent to interventional radiology for thoracentesis.  Thoracentesis revealed a cytology of the pleural fluid on 07/09/2022 consistent with adenocarcinoma of the lung.  Since her diagnosis at the beginning of the month she has had several thoracentesis due to recurrent accumulation of fluid in the pleural space.  She was referred here for consideration of Pleurx catheter placement.  We had a very long discussion today in the office greater than an hour face-to-face discussing pros and cons of Pleurx catheter versus thoracentesis for recurrent pleural effusion.  We briefly talked about pleurodesis as well.  There was also a 2 way telephone call to include patient's daughter and niece as well as the patient's friend who brought them to the office today.    Past Medical History:  Diagnosis Date   Atrial fibrillation ([place])    Complication of anesthesia    cannot take anectine   Hypertension    Inguinal hernia      Family History  Problem Relation Age of Onset   Parkinson's disease Mother    Healthy Sister      Past Surgical History:  Procedure Laterality Date   ABDOMINAL HYSTERECTOMY     DENTAL SURGERY  Jan 11, 2011   bone graft and extraction   EYE SURGERY     detached retina, cryo, lens implant   INGUINAL HERNIA REPAIR  09/27/2011   Procedure: HERNIA REPAIR INGUINAL INCARCERATED;  Surgeon: Pedro Earls, MD;  Location: [place];  Service:  General;  Laterality: Left;  Left Ingunial hernia repair with mesh   INGUINAL HERNIA REPAIR Left 10/19/2012   Procedure: LAPAROSCOPIC possible open left femoral hernia repair ;  Surgeon: Pedro Earls, MD;  Location: [facility];  Service: General;  Laterality: Left;  Laparoscopic Left Femoral Hernia Repair with Mesh   IR THORACENTESIS ASP PLEURAL SPACE W/IMG GUIDE  07/27/2022   IR THORACENTESIS ASP PLEURAL SPACE W/IMG GUIDE  08/02/2022   IR THORACENTESIS ASP PLEURAL SPACE W/IMG GUIDE  08/09/2022   MOUTH SURGERY     lt upper bone graft for implants   TONSILLECTOMY     TUBAL LIGATION      Social History   Socioeconomic History   Marital status: Divorced    Spouse name: Not on file   Number of children: 1   Years of education: Not on file   Highest education level: Not on file  Occupational History   Not on file  Tobacco Use   Smoking status: Former    Packs/day: 0.50    Years: 20.00    Total pack years: 10.00    Types: Cigarettes    Quit date: 09/21/1970    Years since quitting: 51.9   Smokeless tobacco: Never  Substance and Sexual Activity   Alcohol use: Yes    Alcohol/week: 2.0 standard drinks of alcohol    Types: 2 Glasses of wine per week    Comment: occasional   Drug use: No  Sexual activity: Not on file  Other Topics Concern   Not on file  Social History Narrative   Not on file   Social Determinants of Health   Financial Resource Strain: Not on file  Food Insecurity: Not on file  Transportation Needs: Not on file  Physical Activity: Not on file  Stress: Not on file  Social Connections: Not on file  Intimate Partner Violence: Not on file     Allergies  Allergen Reactions   Codeine Nausea And Vomiting   Hydrocodone Nausea And Vomiting   Succinylcholine Chloride     Prolonged paralysis     Outpatient Medications Prior to Visit  Medication Sig Dispense Refill   apixaban (ELIQUIS) 2.5 MG TABS tablet Take 1 tablet (2.5 mg total) by mouth 2 (two) times daily.  60 tablet 0   folic acid (FOLVITE) 1 MG tablet Take 1 tablet (1 mg total) by mouth daily. 30 tablet 2   metoprolol tartrate (LOPRESSOR) 25 MG tablet Take 0.5 tablets (12.5 mg total) by mouth 2 (two) times daily. 60 tablet 0   No facility-administered medications prior to visit.    Review of Systems  Constitutional:  Positive for malaise/fatigue and weight loss. Negative for chills and fever.  HENT:  Negative for hearing loss, sore throat and tinnitus.   Eyes:  Negative for blurred vision and double vision.  Respiratory:  Positive for shortness of breath. Negative for cough, hemoptysis, sputum production, wheezing and stridor.   Cardiovascular:  Negative for chest pain, palpitations, orthopnea, leg swelling and PND.  Gastrointestinal:  Negative for abdominal pain, constipation, diarrhea, heartburn, nausea and vomiting.  Genitourinary:  Negative for dysuria, hematuria and urgency.  Musculoskeletal:  Negative for joint pain and myalgias.  Skin:  Negative for itching and rash.  Neurological:  Negative for dizziness, tingling, weakness and headaches.  Endo/Heme/Allergies:  Negative for environmental allergies. Does not bruise/bleed easily.  Psychiatric/Behavioral:  Negative for depression. The patient is not nervous/anxious and does not have insomnia.   All other systems reviewed and are negative.    Objective:  Physical Exam Vitals reviewed.  Constitutional:      General: She is not in acute distress.    Appearance: She is well-developed.     Comments: Thin cachectic, significant muscle wasting  HENT:     Head: Normocephalic and atraumatic.  Eyes:     General: No scleral icterus.    Conjunctiva/sclera: Conjunctivae normal.     Pupils: Pupils are equal, round, and reactive to light.  Neck:     Vascular: No JVD.     Trachea: No tracheal deviation.  Cardiovascular:     Rate and Rhythm: Normal rate and regular rhythm.     Heart sounds: Normal heart sounds. No murmur  heard. Pulmonary:     Effort: Pulmonary effort is normal. No tachypnea, accessory muscle usage or respiratory distress.     Breath sounds: No stridor. No wheezing, rhonchi or rales.     Comments: Diminished breath sounds bilaterally Abdominal:     General: Bowel sounds are normal. There is no distension.     Palpations: Abdomen is soft.     Tenderness: There is no abdominal tenderness.  Musculoskeletal:        General: Deformity present. No tenderness.     Cervical back: Neck supple.     Right lower leg: Edema present.     Left lower leg: Edema present.  Lymphadenopathy:     Cervical: No cervical adenopathy.  Skin:  General: Skin is warm and dry.     Capillary Refill: Capillary refill takes less than 2 seconds.     Findings: No rash.  Neurological:     Mental Status: She is alert and oriented to person, place, and time.  Psychiatric:        Behavior: Behavior normal.      Vitals:   08/15/22 1312  BP: 120/70  Pulse: 78  SpO2: 100%  Weight: 99 lb 6.4 oz (45.1 kg)  Height: 5\' 1"  (1.549 m)   100% on RA BMI Readings from Last 3 Encounters:  08/15/22 18.78 kg/m  08/10/22 19.01 kg/m  08/02/22 19.50 kg/m   Wt Readings from Last 3 Encounters:  08/15/22 99 lb 6.4 oz (45.1 kg)  08/10/22 100 lb 9.6 oz (45.6 kg)  08/02/22 103 lb 3 oz (46.8 kg)     CBC    Component Value Date/Time   WBC 10.1 08/02/2022 1029   WBC 10.4 07/13/2022 0302   RBC 4.26 08/02/2022 1029   HGB 14.0 08/02/2022 1029   HCT 40.6 08/02/2022 1029   PLT 519 (H) 08/02/2022 1029   MCV 95.3 08/02/2022 1029   MCH 32.9 08/02/2022 1029   MCHC 34.5 08/02/2022 1029   RDW 12.1 08/02/2022 1029   LYMPHSABS 0.7 08/02/2022 1029   MONOABS 1.1 (H) 08/02/2022 1029   EOSABS 0.1 08/02/2022 1029   BASOSABS 0.1 08/02/2022 1029     Chest Imaging: Chest x-ray December 2023: 2 L of pleural fluid removed after IR.  Persistent effusion in the right chest. The patient's images have been independently reviewed by  me.    Pulmonary Functions Testing Results:     No data to display          FeNO:   Pathology:   Echocardiogram:   Heart Catheterization:     Assessment & Plan:     ICD-10-CM   1. Pleural effusion on right  J90     2. Malignant pleural effusion  J91.0     3. Primary lung adenocarcinoma, right ([place])  C34.91       Discussion:  This is a 88 year old female with stage IV adenocarcinoma of the lung, malignant pleural effusion recurrent pleural effusion on the right requiring multiple thoracentesis.  Plan: Today in the office we had a very long protracted discussion regarding the pros and cons of indwelling pleural catheter placement versus repeat thoracentesis. Patient has already had 4 thoracentesis in the past month. I recommended placement of the indwelling pleural catheter, she is however concerned about not being able to manage the tube at home alone.  Her friends are also concerned he may not be able to do this for a long period of time.  They do think they would be able to help for short-term. We would like to consider whether or not she would be approved for home health to have a home health nurse that would be able to do this 2-3 times a week.  If that was possible then she thinks that she would be able to do that at home more likely. She is also concerned about getting this location wet.  As she does enjoy taking a shower.  I did explain that it was okay to get a little bit sprinkles of water on but could not be submerged or drenched at home.  She is going to come back and see me in 3-4 weeks to make a final decision. She would like to start chemo plus immunotherapy and see  if the effusion gets any better before making a decision.    Current Outpatient Medications:    apixaban (ELIQUIS) 2.5 MG TABS tablet, Take 1 tablet (2.5 mg total) by mouth 2 (two) times daily., Disp: 60 tablet, Rfl: 0   folic acid (FOLVITE) 1 MG tablet, Take 1 tablet (1 mg total) by mouth  daily., Disp: 30 tablet, Rfl: 2   metoprolol tartrate (LOPRESSOR) 25 MG tablet, Take 0.5 tablets (12.5 mg total) by mouth 2 (two) times daily., Disp: 60 tablet, Rfl: 0  I spent 63 minutes dedicated to the care of this patient on the date of this encounter to include pre-visit review of records, face-to-face time with the patient discussing conditions above, post visit ordering of testing, clinical documentation with the electronic health record, making appropriate referrals as documented, and communicating necessary findings to members of the patients care team.   Garner Nash, DO [facility] 08/15/2022 1:23 PM

## 2022-08-15 NOTE — Telephone Encounter (Signed)
Gail Jennings had thoracentesis today and she wants to schedule one for next Monday.

## 2022-08-16 ENCOUNTER — Encounter: Payer: Self-pay | Admitting: Nurse Practitioner

## 2022-08-16 ENCOUNTER — Other Ambulatory Visit: Payer: Self-pay | Admitting: Physician Assistant

## 2022-08-16 ENCOUNTER — Other Ambulatory Visit: Payer: Self-pay

## 2022-08-16 ENCOUNTER — Other Ambulatory Visit: Payer: Medicare Other

## 2022-08-16 ENCOUNTER — Inpatient Hospital Stay (HOSPITAL_BASED_OUTPATIENT_CLINIC_OR_DEPARTMENT_OTHER): Payer: Medicare Other | Admitting: Nurse Practitioner

## 2022-08-16 DIAGNOSIS — J910 Malignant pleural effusion: Secondary | ICD-10-CM

## 2022-08-16 DIAGNOSIS — C3491 Malignant neoplasm of unspecified part of right bronchus or lung: Secondary | ICD-10-CM | POA: Diagnosis not present

## 2022-08-16 DIAGNOSIS — J90 Pleural effusion, not elsewhere classified: Secondary | ICD-10-CM

## 2022-08-16 MED ORDER — ONDANSETRON HCL 8 MG PO TABS: 8.0000 mg | ORAL_TABLET | Freq: Three times a day (TID) | ORAL | 1 refills | Status: DC | PRN

## 2022-08-16 MED ORDER — PROCHLORPERAZINE MALEATE 10 MG PO TABS: 10.0000 mg | ORAL_TABLET | Freq: Four times a day (QID) | ORAL | 1 refills | Status: DC | PRN

## 2022-08-16 MED FILL — Dexamethasone Sodium Phosphate Inj 100 MG/10ML: INTRAMUSCULAR | Qty: 1 | Status: AC

## 2022-08-16 MED FILL — Fosaprepitant Dimeglumine For IV Infusion 150 MG (Base Eq): INTRAVENOUS | Qty: 5 | Status: AC

## 2022-08-16 NOTE — Progress Notes (Signed)
**Note Gail-Identified via Obfuscation** [place]   Telephone:([phone]) [phone] Fax:(336) [phone]   Clinic Follow up Note   Patient Care Team: Burnard Bunting, MD as PCP - General (Internal Medicine) Alla Feeling, NP as Nurse Practitioner (Hematology and Oncology) 08/16/2022  I connected with Gail Jennings on 08/16/22 at  9:00 AM EST by telephone visit and verified that I am speaking with the correct person using two identifiers.   I discussed the limitations, risks, security and privacy concerns of performing an evaluation and management service by telemedicine and the availability of in-person appointments. I also discussed with the patient that there may be a patient responsible charge related to this service. The patient expressed understanding and agreed to proceed.   Other persons participating in the visit and their role in the encounter: Patient's advocate, Gail Jennings   Patient's location: Home Provider's location: [place] office   CHIEF COMPLAINT: F/up brain MRI results   CURRENT THERAPY: PENDING first line Ipi/Nivo + Carbo/Alimta  INTERVAL HISTORY: Gail Jennings presents by phone, together on the phone with Gail Jennings her patient advocate.  She had thoracentesis yesterday and feels instant relief, a little sore at the site but no other complaints.  She has been requiring thoracentesis every 7-10 days lately.  Met with Dr. Valeta Harms yesterday and discussed Pleurx, but she prefers to hold to see if treatment helps first.  She tolerated the brain MRI.  She has lost a few pounds, notes that she is going to so many appointments lately and often does not have time to eat lunch.  Trying to keep her strength up.  Denies pain, fever, or any other new specific complaints.  All other systems were reviewed with the patient and are negative.  MEDICAL HISTORY:  Past Medical History:  Diagnosis Date   Atrial fibrillation ([place])    Complication of anesthesia    cannot take anectine   Hypertension    Inguinal  hernia     SURGICAL HISTORY: Past Surgical History:  Procedure Laterality Date   ABDOMINAL HYSTERECTOMY     DENTAL SURGERY  Jan 11, 2011   bone graft and extraction   EYE SURGERY     detached retina, cryo, lens implant   INGUINAL HERNIA REPAIR  09/27/2011   Procedure: HERNIA REPAIR INGUINAL INCARCERATED;  Surgeon: Pedro Earls, MD;  Location: [place];  Service: General;  Laterality: Left;  Left Ingunial hernia repair with mesh   INGUINAL HERNIA REPAIR Left 10/19/2012   Procedure: LAPAROSCOPIC possible open left femoral hernia repair ;  Surgeon: Pedro Earls, MD;  Location: [facility];  Service: General;  Laterality: Left;  Laparoscopic Left Femoral Hernia Repair with Mesh   IR THORACENTESIS ASP PLEURAL SPACE W/IMG GUIDE  07/27/2022   IR THORACENTESIS ASP PLEURAL SPACE W/IMG GUIDE  08/02/2022   IR THORACENTESIS ASP PLEURAL SPACE W/IMG GUIDE  08/09/2022   MOUTH SURGERY     lt upper bone graft for implants   TONSILLECTOMY     TUBAL LIGATION      I have reviewed the social history and family history with the patient and they are unchanged from previous note.  ALLERGIES:  is allergic to codeine, hydrocodone, and succinylcholine chloride.  MEDICATIONS:  Current Outpatient Medications  Medication Sig Dispense Refill   apixaban (ELIQUIS) 2.5 MG TABS tablet Take 1 tablet (2.5 mg total) by mouth 2 (two) times daily. 60 tablet 0   folic acid (FOLVITE) 1 MG tablet Take 1 tablet (1 mg total) by mouth  daily. 30 tablet 2   metoprolol tartrate (LOPRESSOR) 25 MG tablet Take 0.5 tablets (12.5 mg total) by mouth 2 (two) times daily. 60 tablet 0   ondansetron (ZOFRAN) 8 MG tablet Take 1 tablet (8 mg total) by mouth every 8 (eight) hours as needed for nausea or vomiting. Start on day 3 after carboplatin. 30 tablet 1   prochlorperazine (COMPAZINE) 10 MG tablet Take 1 tablet (10 mg total) by mouth every 6 (six) hours as needed for nausea or vomiting. 30 tablet 1   No current  facility-administered medications for this visit.    PHYSICAL EXAMINATION: ECOG PERFORMANCE STATUS: 1 - Symptomatic but completely ambulatory  There were no vitals filed for this visit. There were no vitals filed for this visit.  Patient appears well over the phone.  Voice is strong, speech is clear.  Mood/affect appear normal for situation.  Cough or conversational dyspnea  LABORATORY DATA:  I have reviewed the data as listed    Latest Ref Rng & Units 08/02/2022   10:29 AM 07/13/2022    3:02 AM 07/12/2022    2:29 AM  CBC  WBC 4.0 - 10.5 K/uL 10.1  10.4  10.3   Hemoglobin 12.0 - 15.0 g/dL 14.0  11.9  11.7   Hematocrit 36.0 - 46.0 % 40.6  33.7  33.3   Platelets 150 - 400 K/uL 519  345  331         Latest Ref Rng & Units 08/02/2022   10:29 AM 07/13/2022    3:02 AM 07/12/2022    2:29 AM  CMP  Glucose 70 - 99 mg/dL 107  111  105   BUN 8 - 23 mg/dL _0 Creatinine 0.44 - 1.00 mg/dL 0.64  0.54  0.58   Sodium 135 - 145 mmol/L 133  129  128   Potassium 3.5 - 5.1 mmol/L 4.2  3.8  3.9   Chloride 98 - 111 mmol/L 100  99  99   CO2 22 - 32 mmol/L _1 Calcium 8.9 - 10.3 mg/dL 9.2  8.5  8.0   Total Protein 6.5 - 8.1 g/dL 6.3     Total Bilirubin 0.3 - 1.2 mg/dL 0.4     Alkaline Phos 38 - 126 U/L 137     AST 15 - 41 U/L 19     ALT 0 - 44 U/L 15         RADIOGRAPHIC STUDIES: I have personally reviewed the radiological images as listed and agreed with the findings in the report. US Thoracentesis Asp Pleural space w/IMG guide  Result Date: 08/15/2022 INDICATION: Primary lung adenocarcinoma, recurrent pleural effusion and dyspnea EXAM: ULTRASOUND GUIDED RIGHT THORACENTESIS MEDICATIONS: None. COMPLICATIONS: None immediate. PROCEDURE: An ultrasound guided thoracentesis was thoroughly discussed with the patient and questions answered. The benefits, risks, alternatives and complications were also discussed. The patient understands and wishes to proceed with the procedure.  Written consent was obtained. Ultrasound was performed to localize and mark an adequate pocket of fluid in the right chest. The area was then prepped and draped in the normal sterile fashion. 1% Lidocaine was used for local anesthesia. Under ultrasound guidance a 6 Fr Safe-T-Centesis catheter was introduced. Thoracentesis was performed. The catheter was removed and a dressing applied. FINDINGS: A total of approximately 1.3L of amber pleural fluid was removed. IMPRESSION: Successful ultrasound guided right thoracentesis yielding 1.3L of pleural fluid. Performed and dictated by Pasty Spillers, PA-C Electronically  Signed   By: Markus Daft M.D.   On: 08/15/2022 12:14   DG Chest 1 View  Result Date: 08/15/2022 CLINICAL DATA:  88 year old female with a history of recurrent malignant right pleural effusion. Lung cancer. Status post ultrasound-guided thoracentesis this morning. EXAM: CHEST  1 VIEW COMPARISON:  Chest radiographs 08/09/2022 and earlier. FINDINGS: PA view at 1122 hours. Underlying large lung volumes. No pneumothorax identified. Mostly layering right pleural effusion appears smaller. Small volume of fluid now tracking into the right minor fissure. Mildly improved right lung base ventilation. Stable cardiac size and mediastinal contours. Left lung appears stable. No acute osseous abnormality identified. Calcified aortic atherosclerosis. Paucity of bowel gas in the upper abdomen. IMPRESSION: 1. Decreased right pleural effusion and no pneumothorax following thoracentesis. 2. Known right lung mass.  No new cardiopulmonary abnormality. Electronically Signed   By: Genevie Ann M.D.   On: 08/15/2022 11:32     ASSESSMENT & PLAN: 88 yo female with    Adenocarcinoma of the Right lung with pleural and nodal metastasis, KRAS + G12 C -She presented with large R pleural effusion requiring chest tube, CT concerning for primary right lung cancer with nodal metastasis. Pathology from pleural fluid confirmed adenocarcinoma  c/w lung primary.  -PET scan which shows hypermetabolic R lung mass, adenopathy, and pleura c/w metastatic disease. There was hypermetabolism in a left rib without discrete osseous lesion on CT -Brain MRI is negative, I reviewed results with her today by phone  -Gail Jennings appears stable over the phone. She feels much better after thora yesterday. Will verify they obtained cytology to check PD-L1 if enough cellularity. We will also work to schedule another thora 12/18 per pt request. She understands if she develops respiratory distress over the nights/weekends she will need to come to ED. -She met with Dr. Valeta Harms 08/15/22 to discuss pleurX but prefers to hold for now, to see if treatment helps. -She transferred care to Dr. Julien Nordmann and Rubin Payor and plans to begin first line Carbo/Alimta + Ipi/Nivo 08/17/22 -I will send in anti-emetics to her pharmacy, she knows to bring them with her to chemo teaching session tomorrow -We discussed diet/nutrition and supplements and how to stay hydrated/nourished.  -We reviewed her schedule and I answered questions. -Return 08/17/22 for lab, chemo ed, and cycle 1 day 1, then toxicity f/up one week after starting treatment   Malignant right pleural effusion -Secondary to #1 -S/p thoracentesis x5 since symptom onset, last on 08/15/2022; she gets immediate relief, then re-accumulation over 7-10 days  -She met with Dr. Valeta Harms about Pleurx, pt prefers to hold for now, to see how treatment helps -Will schedule another on 08/22/22, per pt request   Afib, HTN -A-fib found on presentation to ED for lung cancer workup, not previously on anticoagulation -Tolerating Eliquis and metoprolol   Social -She was healthy, active and independent prior to diagnosis, lives alone  -She does not have family here but strong community and church support, and a patient advocate, and Gail Jennings -She has been referred to social work   Goals of care  -She understands her diseases not  operable and therefore not curable, but still treatable -The treatment goal is palliative -Full code as of 07/09/2022 hospitalization   PLAN: -Brain MRI reviewed -Confirmed with lab, pleural fluid from 08/15/22 was discarded, will or cytology on next thora  -Will schedule repeat thora 12/18 per pt request  -Rx zofran and compazine to pharmacy, she will bring to chemo ed tomorrow -Reviewed diet/nutrition -Return 08/17/22  for chemo ed and C1D1 -Toxicity f/up 1 week after first treatment    I discussed the assessment and treatment plan with the patient. The patient was provided an opportunity to ask questions and all were answered. The patient agreed with the plan and demonstrated an understanding of the instructions. The patient was advised to call back or seek an in-person evaluation if the symptoms worsen or if the condition fails to improve as anticipated.  All questions were answered. I spent 18 minutes counseling the patient non face to face. The total time spent in the appointment was 30 minutes and more than 50% was on counseling and review of test results and coordination of care.      Alla Feeling, NP 08/16/22

## 2022-08-17 ENCOUNTER — Inpatient Hospital Stay: Payer: Medicare Other

## 2022-08-17 ENCOUNTER — Other Ambulatory Visit: Payer: Self-pay

## 2022-08-17 ENCOUNTER — Other Ambulatory Visit: Payer: Self-pay | Admitting: Medical Oncology

## 2022-08-17 ENCOUNTER — Encounter: Payer: Self-pay | Admitting: Internal Medicine

## 2022-08-17 ENCOUNTER — Encounter: Payer: Self-pay | Admitting: Nurse Practitioner

## 2022-08-17 VITALS — BP 148/71 | HR 70 | Temp 97.8°F | Resp 18 | Wt 101.5 lb

## 2022-08-17 DIAGNOSIS — Z79899 Other long term (current) drug therapy: Secondary | ICD-10-CM | POA: Diagnosis not present

## 2022-08-17 DIAGNOSIS — C3431 Malignant neoplasm of lower lobe, right bronchus or lung: Secondary | ICD-10-CM | POA: Diagnosis not present

## 2022-08-17 DIAGNOSIS — J910 Malignant pleural effusion: Secondary | ICD-10-CM | POA: Diagnosis not present

## 2022-08-17 DIAGNOSIS — Z5112 Encounter for antineoplastic immunotherapy: Secondary | ICD-10-CM | POA: Diagnosis not present

## 2022-08-17 DIAGNOSIS — Z23 Encounter for immunization: Secondary | ICD-10-CM | POA: Diagnosis not present

## 2022-08-17 DIAGNOSIS — Z5111 Encounter for antineoplastic chemotherapy: Secondary | ICD-10-CM | POA: Diagnosis not present

## 2022-08-17 DIAGNOSIS — C3491 Malignant neoplasm of unspecified part of right bronchus or lung: Secondary | ICD-10-CM

## 2022-08-17 DIAGNOSIS — I878 Other specified disorders of veins: Secondary | ICD-10-CM

## 2022-08-17 LAB — CBC WITH DIFFERENTIAL (CANCER CENTER ONLY)
Abs Immature Granulocytes: 0.07 10*3/uL (ref 0.00–0.07)
Basophils Absolute: 0.1 10*3/uL (ref 0.0–0.1)
Basophils Relative: 1 %
Eosinophils Absolute: 0.1 10*3/uL (ref 0.0–0.5)
Eosinophils Relative: 1 %
HCT: 35.4 % — ABNORMAL LOW (ref 36.0–46.0)
Hemoglobin: 12.1 g/dL (ref 12.0–15.0)
Immature Granulocytes: 1 %
Lymphocytes Relative: 6 %
Lymphs Abs: 0.6 10*3/uL — ABNORMAL LOW (ref 0.7–4.0)
MCH: 31.8 pg (ref 26.0–34.0)
MCHC: 34.2 g/dL (ref 30.0–36.0)
MCV: 92.9 fL (ref 80.0–100.0)
Monocytes Absolute: 1.1 10*3/uL — ABNORMAL HIGH (ref 0.1–1.0)
Monocytes Relative: 10 %
Neutro Abs: 8.4 10*3/uL — ABNORMAL HIGH (ref 1.7–7.7)
Neutrophils Relative %: 81 %
Platelet Count: 444 10*3/uL — ABNORMAL HIGH (ref 150–400)
RBC: 3.81 MIL/uL — ABNORMAL LOW (ref 3.87–5.11)
RDW: 12.4 % (ref 11.5–15.5)
WBC Count: 10.3 10*3/uL (ref 4.0–10.5)
nRBC: 0 % (ref 0.0–0.2)

## 2022-08-17 LAB — CMP (CANCER CENTER ONLY)
ALT: 9 U/L (ref 0–44)
AST: 11 U/L — ABNORMAL LOW (ref 15–41)
Albumin: 2.8 g/dL — ABNORMAL LOW (ref 3.5–5.0)
Alkaline Phosphatase: 96 U/L (ref 38–126)
Anion gap: 7 (ref 5–15)
BUN: 13 mg/dL (ref 8–23)
CO2: 25 mmol/L (ref 22–32)
Calcium: 8.7 mg/dL — ABNORMAL LOW (ref 8.9–10.3)
Chloride: 100 mmol/L (ref 98–111)
Creatinine: 0.45 mg/dL (ref 0.44–1.00)
GFR, Estimated: >60 mL/min (ref >60)
Glucose, Bld: 114 mg/dL — ABNORMAL HIGH (ref 70–99)
Potassium: 3.8 mmol/L (ref 3.5–5.1)
Sodium: 132 mmol/L — ABNORMAL LOW (ref 135–145)
Total Bilirubin: 0.3 mg/dL (ref 0.3–1.2)
Total Protein: 5.2 g/dL — ABNORMAL LOW (ref 6.5–8.1)

## 2022-08-17 LAB — TSH: TSH: 3.121 u[IU]/mL (ref 0.350–4.500)

## 2022-08-17 LAB — GUARDANT 360

## 2022-08-17 MED ORDER — SODIUM CHLORIDE 0.9 % IV SOLN: Freq: Once | INTRAVENOUS | Status: AC

## 2022-08-17 MED ORDER — SODIUM CHLORIDE 0.9 % IV SOLN
360.0000 mg | Freq: Once | INTRAVENOUS | Status: AC
  Administered 2022-08-17: 360 mg via INTRAVENOUS
  Filled 2022-08-17: qty 24

## 2022-08-17 MED ORDER — SODIUM CHLORIDE 0.9 % IV SOLN
10.0000 mg | Freq: Once | INTRAVENOUS | Status: AC
  Administered 2022-08-17: 10 mg via INTRAVENOUS
  Filled 2022-08-17: qty 10

## 2022-08-17 MED ORDER — PALONOSETRON HCL INJECTION 0.25 MG/5ML
0.2500 mg | Freq: Once | INTRAVENOUS | Status: AC
  Administered 2022-08-17: 0.25 mg via INTRAVENOUS
  Filled 2022-08-17: qty 5

## 2022-08-17 MED ORDER — SODIUM CHLORIDE 0.9 % IV SOLN
214.0000 mg | Freq: Once | INTRAVENOUS | Status: AC
  Administered 2022-08-17: 210 mg via INTRAVENOUS
  Filled 2022-08-17: qty 21

## 2022-08-17 MED ORDER — SODIUM CHLORIDE 0.9 % IV SOLN
400.0000 mg/m2 | Freq: Once | INTRAVENOUS | Status: AC
  Administered 2022-08-17: 600 mg via INTRAVENOUS
  Filled 2022-08-17: qty 20

## 2022-08-17 MED ORDER — SODIUM CHLORIDE 0.9 % IV SOLN
150.0000 mg | Freq: Once | INTRAVENOUS | Status: AC
  Administered 2022-08-17: 150 mg via INTRAVENOUS
  Filled 2022-08-17: qty 150

## 2022-08-17 MED ORDER — FAMOTIDINE IN NACL 20-0.9 MG/50ML-% IV SOLN
20.0000 mg | Freq: Once | INTRAVENOUS | Status: AC
  Administered 2022-08-17: 20 mg via INTRAVENOUS
  Filled 2022-08-17: qty 50

## 2022-08-17 MED ORDER — SODIUM CHLORIDE 0.9 % IV SOLN
1.0000 mg/kg | Freq: Once | INTRAVENOUS | Status: AC
  Administered 2022-08-17: 50 mg via INTRAVENOUS
  Filled 2022-08-17: qty 10

## 2022-08-17 MED ORDER — DIPHENHYDRAMINE HCL 50 MG/ML IJ SOLN
25.0000 mg | Freq: Once | INTRAMUSCULAR | Status: AC
  Administered 2022-08-17: 25 mg via INTRAVENOUS
  Filled 2022-08-17: qty 1

## 2022-08-17 NOTE — Progress Notes (Signed)

## 2022-08-17 NOTE — Patient Instructions (Signed)
[facility]  Discharge Instructions: Thank you for choosing [facility] to provide your oncology and hematology care.   If you have a lab appointment with the [facility], please go directly to the [facility] and check in at the registration area.   Wear comfortable clothing and clothing appropriate for easy access to any Portacath or PICC line.   We strive to give you quality time with your provider. You may need to reschedule your appointment if you arrive late (15 or more minutes).  Arriving late affects you and other patients whose appointments are after yours.  Also, if you miss three or more appointments without notifying the office, you may be dismissed from the clinic at the provider's discretion.      For prescription refill requests, have your pharmacy contact our office and allow 72 hours for refills to be completed.    Today you received the following chemotherapy and/or immunotherapy agents     Nivolumab (Opdivo)  Ipilimumab (Yervoy) Pemetrexed (Alimta)  Carboplatin    To help prevent nausea and vomiting after your treatment, we encourage you to take your nausea medication as directed.  BELOW ARE SYMPTOMS THAT SHOULD BE REPORTED IMMEDIATELY: *FEVER GREATER THAN 100.4 F (38 C) OR HIGHER *CHILLS OR SWEATING *NAUSEA AND VOMITING THAT IS NOT CONTROLLED WITH YOUR NAUSEA MEDICATION *UNUSUAL SHORTNESS OF BREATH *UNUSUAL BRUISING OR BLEEDING *URINARY PROBLEMS (pain or burning when urinating, or frequent urination) *BOWEL PROBLEMS (unusual diarrhea, constipation, pain near the anus) TENDERNESS IN MOUTH AND THROAT WITH OR WITHOUT PRESENCE OF ULCERS (sore throat, sores in mouth, or a toothache) UNUSUAL RASH, SWELLING OR PAIN  UNUSUAL VAGINAL DISCHARGE OR ITCHING   Items with * indicate a potential emergency and should be followed up as soon as possible or go to the Emergency Department if any problems should occur.  Please show  the CHEMOTHERAPY ALERT CARD or IMMUNOTHERAPY ALERT CARD at check-in to the Emergency Department and triage nurse.  Should you have questions after your visit or need to cancel or reschedule your appointment, please contact [facility]  Dept: [phone]  and follow the prompts.  Office hours are 8:00 a.m. to 4:30 p.m. Monday - Friday. Please note that voicemails left after 4:00 p.m. may not be returned until the following business day.  We are closed weekends and major holidays. You have access to a nurse at all times for urgent questions. Please call the main number to the clinic Dept: [phone] and follow the prompts.   For any non-urgent questions, you may also contact your provider using MyChart. We now offer e-Visits for anyone 42 and older to request care online for non-urgent symptoms. For details visit [email or web address].   Also download the MyChart app! Go to the app store, search "MyChart", open the app, select [facility], and log in with your MyChart username and password.  Masks are optional in the cancer centers. If you would like for your care team to wear a mask while they are taking care of you, please let them know. You may have one support person who is at least 86 years old accompany you for your appointments.  Nivolumab (Opdivo) Injection What is this medication? NIVOLUMAB (nye VOL ue mab) treats some types of cancer. It works by helping your immune system slow or stop the spread of cancer cells. It is a monoclonal antibody. This medicine may be used for other purposes; ask your health care provider or  pharmacist if you have questions. COMMON BRAND NAME(S): Opdivo What should I tell my care team before I take this medication? They need to know if you have any of these conditions: Allogeneic stem cell transplant (uses someone else's stem cells) Autoimmune diseases, such as Crohn disease, ulcerative colitis, lupus History of chest  radiation Nervous system problems, such as Guillain-Barre syndrome or myasthenia gravis Organ transplant An unusual or allergic reaction to nivolumab, other medications, foods, dyes, or preservatives Pregnant or trying to get pregnant Breast-feeding How should I use this medication? This medication is infused into a vein. It is given in a hospital or clinic setting. A special MedGuide will be given to you before each treatment. Be sure to read this information carefully each time. Talk to your care team about the use of this medication in children. While it may be prescribed for children as young as 12 years for selected conditions, precautions do apply. Overdosage: If you think you have taken too much of this medicine contact a poison control center or emergency room at once. NOTE: This medicine is only for you. Do not share this medicine with others. What if I miss a dose? Keep appointments for follow-up doses. It is important not to miss your dose. Call your care team if you are unable to keep an appointment. What may interact with this medication? Interactions have not been studied. This list may not describe all possible interactions. Give your health care provider a list of all the medicines, herbs, non-prescription drugs, or dietary supplements you use. Also tell them if you smoke, drink alcohol, or use illegal drugs. Some items may interact with your medicine. What should I watch for while using this medication? Your condition will be monitored carefully while you are receiving this medication. You may need blood work while taking this medication. This medication may cause serious skin reactions. They can happen weeks to months after starting the medication. Contact your care team right away if you notice fevers or flu-like symptoms with a rash. The rash may be red or purple and then turn into blisters or peeling of the skin. You may also notice a red rash with swelling of the face, lips,  or lymph nodes in your neck or under your arms. Tell your care team right away if you have any change in your eyesight. Talk to your care team if you are pregnant or think you might be pregnant. A negative pregnancy test is required before starting this medication. A reliable form of contraception is recommended while taking this medication and for 5 months after the last dose. Talk to your care team about effective forms of contraception. Do not breast-feed while taking this medication and for 5 months after the last dose. What side effects may I notice from receiving this medication? Side effects that you should report to your care team as soon as possible: Allergic reactions--skin rash, itching, hives, swelling of the face, lips, tongue, or throat Dry cough, shortness of breath or trouble breathing Eye pain, redness, irritation, or discharge with blurry or decreased vision Heart muscle inflammation--unusual weakness or fatigue, shortness of breath, chest pain, fast or irregular heartbeat, dizziness, swelling of the ankles, feet, or hands Hormone gland problems--headache, sensitivity to light, unusual weakness or fatigue, dizziness, fast or irregular heartbeat, increased sensitivity to cold or heat, excessive sweating, constipation, hair loss, increased thirst or amount of urine, tremors or shaking, irritability Infusion reactions--chest pain, shortness of breath or trouble breathing, feeling faint or lightheaded Kidney  injury (glomerulonephritis)--decrease in the amount of urine, red or dark brown urine, foamy or bubbly urine, swelling of the ankles, hands, or feet Liver injury--right upper belly pain, loss of appetite, nausea, light-colored stool, dark yellow or brown urine, yellowing skin or eyes, unusual weakness or fatigue Pain, tingling, or numbness in the hands or feet, muscle weakness, change in vision, confusion or trouble speaking, loss of balance or coordination, trouble walking,  seizures Rash, fever, and swollen lymph nodes Redness, blistering, peeling, or loosening of the skin, including inside the mouth Sudden or severe stomach pain, bloody diarrhea, fever, nausea, vomiting Side effects that usually do not require medical attention (report these to your care team if they continue or are bothersome): Bone, joint, or muscle pain Diarrhea Fatigue Loss of appetite Nausea Skin rash This list may not describe all possible side effects. Call your doctor for medical advice about side effects. You may report side effects to FDA at 1-800-FDA-1088. Where should I keep my medication? This medication is given in a hospital or clinic. It will not be stored at home. NOTE: This sheet is a summary. It may not cover all possible information. If you have questions about this medicine, talk to your doctor, pharmacist, or health care provider.  2023 Elsevier/Gold Standard (2021-12-20 00:00:00)  Ipilimumab (Yervoy) Injection What is this medication? IPILIMUMAB (IP i LIM ue mab) treats some types of cancer. It works by helping your immune system slow or stop the spread of cancer cells. It is a monoclonal antibody. This medicine may be used for other purposes; ask your health care provider or pharmacist if you have questions. COMMON BRAND NAME(S): YERVOY What should I tell my care team before I take this medication? They need to know if you have any of these conditions: Allogeneic stem cell transplant (uses someone else's stem cells) Autoimmune diseases, such as Crohn disease, ulcerative colitis, lupus Nervous system problems, such as Guillain-Barre syndrome or myasthenia gravis Organ transplant An unusual or allergic reaction to ipilimumab, other medications, foods, dyes, or preservatives Pregnant or trying to get pregnant Breast-feeding How should I use this medication? This medication is infused into a vein. It is given by your care team in a hospital or clinic setting. A  special MedGuide will be given to you before each treatment. Be sure to read this information carefully each time. Talk to your care team about the use of this medication in children. While it may be prescribed for children as young as 12 years for selected conditions, precautions do apply. Overdosage: If you think you have taken too much of this medicine contact a poison control center or emergency room at once. NOTE: This medicine is only for you. Do not share this medicine with others. What if I miss a dose? Keep appointments for follow-up doses. It is important not to miss your dose. Call your care team if you are unable to keep an appointment. What may interact with this medication? Interactions are not expected. This list may not describe all possible interactions. Give your health care provider a list of all the medicines, herbs, non-prescription drugs, or dietary supplements you use. Also tell them if you smoke, drink alcohol, or use illegal drugs. Some items may interact with your medicine. What should I watch for while using this medication? Your condition will be monitored carefully while you are receiving this medication. You may need blood work while taking this medication. This medication may cause serious skin reactions. They can happen weeks to months after  starting the medication. Contact your care team right away if you notice fevers or flu-like symptoms with a rash. The rash may be red or purple and then turn into blisters or peeling of the skin. You may also notice a red rash with swelling of the face, lips, or lymph nodes in your neck or under your arms. Tell your care team right away if you have any change in your eyesight. Talk to your care team if you may be pregnant. Serious birth defects can occur if you take this medication during pregnancy and for 3 months after the last dose. You will need a negative pregnancy test before starting this medication. Contraception is  recommended while taking this medication and for 3 months after the last dose. Your care team can help you find the option that works for you. Do not breastfeed while taking this medication and for 3 months after the last dose. What side effects may I notice from receiving this medication? Side effects that you should report to your care team as soon as possible: Allergic reactions--skin rash, itching, hives, swelling of the face, lips, tongue, or throat Dry cough, shortness of breath or trouble breathing Eye pain, redness, irritation, or discharge with blurry or decreased vision Heart muscle inflammation--unusual weakness or fatigue, shortness of breath, chest pain, fast or irregular heartbeat, dizziness, swelling of the ankles, feet, or hands Hormone gland problems--headache, sensitivity to light, unusual weakness or fatigue, dizziness, fast or irregular heartbeat, increased sensitivity to cold or heat, excessive sweating, constipation, hair loss, increased thirst or amount of urine, tremors or shaking, irritability Infusion reactions--chest pain, shortness of breath or trouble breathing, feeling faint or lightheaded Kidney injury (glomerulonephritis)--decrease in the amount of urine, red or dark brown urine, foamy or bubbly urine, swelling of the ankles, hands, or feet Liver injury--right upper belly pain, loss of appetite, nausea, light-colored stool, dark yellow or brown urine, yellowing skin or eyes, unusual weakness or fatigue Pain, tingling, or numbness in the hands or feet, muscle weakness, change in vision, confusion or trouble speaking, loss of balance or coordination, trouble walking, seizures Rash, fever, and swollen lymph nodes Redness, blistering, peeling, or loosening of the skin, including inside the mouth Sudden or severe stomach pain, bloody diarrhea, fever, nausea, vomiting Side effects that usually do not require medical attention (report to your care team if they continue or are  bothersome): Bone, joint, or muscle pain Diarrhea Fatigue Loss of appetite Nausea Skin rash This list may not describe all possible side effects. Call your doctor for medical advice about side effects. You may report side effects to FDA at 1-800-FDA-1088. Where should I keep my medication? This medication is given in a hospital or clinic. It will not be stored at home. NOTE: This sheet is a summary. It may not cover all possible information. If you have questions about this medicine, talk to your doctor, pharmacist, or health care provider.  2023 Elsevier/Gold Standard (2022-01-04 00:00:00) Pemetrexed (Alimta) Injection What is this medication? PEMETREXED (PEM e TREX ed) treats some types of cancer. It works by slowing down the growth of cancer cells. This medicine may be used for other purposes; ask your health care provider or pharmacist if you have questions. COMMON BRAND NAME(S): Alimta, PEMFEXY What should I tell my care team before I take this medication? They need to know if you have any of these conditions: Infection, such as chickenpox, cold sores, or herpes Kidney disease Low blood cell levels (white cells, red cells, and platelets)  Lung or breathing disease, such as asthma Radiation therapy An unusual or allergic reaction to pemetrexed, other medications, foods, dyes, or preservatives If you or your partner are pregnant or trying to get pregnant Breast-feeding How should I use this medication? This medication is injected into a vein. It is given by your care team in a hospital or clinic setting. Talk to your care team about the use of this medication in children. Special care may be needed. Overdosage: If you think you have taken too much of this medicine contact a poison control center or emergency room at once. NOTE: This medicine is only for you. Do not share this medicine with others. What if I miss a dose? Keep appointments for follow-up doses. It is important not to  miss your dose. Call your care team if you are unable to keep an appointment. What may interact with this medication? Do not take this medication with any of the following: Live virus vaccines This medication may also interact with the following: Ibuprofen This list may not describe all possible interactions. Give your health care provider a list of all the medicines, herbs, non-prescription drugs, or dietary supplements you use. Also tell them if you smoke, drink alcohol, or use illegal drugs. Some items may interact with your medicine. What should I watch for while using this medication? Your condition will be monitored carefully while you are receiving this medication. This medication may make you feel generally unwell. This is not uncommon as chemotherapy can affect healthy cells as well as cancer cells. Report any side effects. Continue your course of treatment even though you feel ill unless your care team tells you to stop. This medication can cause serious side effects. To reduce the risk, your care team may give you other medications to take before receiving this one. Be sure to follow the directions from your care team. This medication can cause a rash or redness in areas of the body that have previously had radiation therapy. If you have had radiation therapy, tell your care team if you notice a rash in this area. This medication may increase your risk of getting an infection. Call your care team for advice if you get a fever, chills, sore throat, or other symptoms of a cold or flu. Do not treat yourself. Try to avoid being around people who are sick. Be careful brushing or flossing your teeth or using a toothpick because you may get an infection or bleed more easily. If you have any dental work done, tell your dentist you are receiving this medication. Avoid taking medications that contain aspirin, acetaminophen, ibuprofen, naproxen, or ketoprofen unless instructed by your care team. These  medications may hide a fever. Check with your care team if you have severe diarrhea, nausea, and vomiting, or if you sweat a lot. The loss of too much body fluid may make it dangerous for you to take this medication. Talk to your care team if you or your partner wish to become pregnant or think either of you might be pregnant. This medication can cause serious birth defects if taken during pregnancy and for 6 months after the last dose. A negative pregnancy test is required before starting this medication. A reliable form of contraception is recommended while taking this medication and for 6 months after the last dose. Talk to your care team about reliable forms of contraception. Do not father a child while taking this medication and for 3 months after the last dose. Use a condom  while having sex during this time period. Do not breastfeed while taking this medication and for 1 week after the last dose. This medication may cause infertility. Talk to your care team if you are concerned about your fertility. What side effects may I notice from receiving this medication? Side effects that you should report to your care team as soon as possible: Allergic reactions--skin rash, itching, hives, swelling of the face, lips, tongue, or throat Dry cough, shortness of breath or trouble breathing Infection--fever, chills, cough, sore throat, wounds that don't heal, pain or trouble when passing urine, general feeling of discomfort or being unwell Kidney injury--decrease in the amount of urine, swelling of the ankles, hands, or feet Low red blood cell level--unusual weakness or fatigue, dizziness, headache, trouble breathing Redness, blistering, peeling, or loosening of the skin, including inside the mouth Unusual bruising or bleeding Side effects that usually do not require medical attention (report to your care team if they continue or are bothersome): Fatigue Loss of appetite Nausea Vomiting This list may not  describe all possible side effects. Call your doctor for medical advice about side effects. You may report side effects to FDA at 1-800-FDA-1088. Where should I keep my medication? This medication is given in a hospital or clinic. It will not be stored at home. NOTE: This sheet is a summary. It may not cover all possible information. If you have questions about this medicine, talk to your doctor, pharmacist, or health care provider.  2023 Elsevier/Gold Standard (2021-12-27 00:00:00) Carboplatin Injection What is this medication? CARBOPLATIN (KAR boe pla tin) treats some types of cancer. It works by slowing down the growth of cancer cells. This medicine may be used for other purposes; ask your health care provider or pharmacist if you have questions. COMMON BRAND NAME(S): Paraplatin What should I tell my care team before I take this medication? They need to know if you have any of these conditions: Blood disorders Hearing problems Kidney disease Recent or ongoing radiation therapy An unusual or allergic reaction to carboplatin, cisplatin, other medications, foods, dyes, or preservatives Pregnant or trying to get pregnant Breast-feeding How should I use this medication? This medication is injected into a vein. It is given by your care team in a hospital or clinic setting. Talk to your care team about the use of this medication in children. Special care may be needed. Overdosage: If you think you have taken too much of this medicine contact a poison control center or emergency room at once. NOTE: This medicine is only for you. Do not share this medicine with others. What if I miss a dose? Keep appointments for follow-up doses. It is important not to miss your dose. Call your care team if you are unable to keep an appointment. What may interact with this medication? Medications for seizures Some antibiotics, such as amikacin, gentamicin, neomycin, streptomycin, tobramycin Vaccines This  list may not describe all possible interactions. Give your health care provider a list of all the medicines, herbs, non-prescription drugs, or dietary supplements you use. Also tell them if you smoke, drink alcohol, or use illegal drugs. Some items may interact with your medicine. What should I watch for while using this medication? Your condition will be monitored carefully while you are receiving this medication. You may need blood work while taking this medication. This medication may make you feel generally unwell. This is not uncommon, as chemotherapy can affect healthy cells as well as cancer cells. Report any side effects. Continue your course  of treatment even though you feel ill unless your care team tells you to stop. In some cases, you may be given additional medications to help with side effects. Follow all directions for their use. This medication may increase your risk of getting an infection. Call your care team for advice if you get a fever, chills, sore throat, or other symptoms of a cold or flu. Do not treat yourself. Try to avoid being around people who are sick. Avoid taking medications that contain aspirin, acetaminophen, ibuprofen, naproxen, or ketoprofen unless instructed by your care team. These medications may hide a fever. Be careful brushing or flossing your teeth or using a toothpick because you may get an infection or bleed more easily. If you have any dental work done, tell your dentist you are receiving this medication. Talk to your care team if you wish to become pregnant or think you might be pregnant. This medication can cause serious birth defects. Talk to your care team about effective forms of contraception. Do not breast-feed while taking this medication. What side effects may I notice from receiving this medication? Side effects that you should report to your care team as soon as possible: Allergic reactions--skin rash, itching, hives, swelling of the face, lips,  tongue, or throat Infection--fever, chills, cough, sore throat, wounds that don't heal, pain or trouble when passing urine, general feeling of discomfort or being unwell Low red blood cell level--unusual weakness or fatigue, dizziness, headache, trouble breathing Pain, tingling, or numbness in the hands or feet, muscle weakness, change in vision, confusion or trouble speaking, loss of balance or coordination, trouble walking, seizures Unusual bruising or bleeding Side effects that usually do not require medical attention (report to your care team if they continue or are bothersome): Hair loss Nausea Unusual weakness or fatigue Vomiting This list may not describe all possible side effects. Call your doctor for medical advice about side effects. You may report side effects to FDA at 1-800-FDA-1088. Where should I keep my medication? This medication is given in a hospital or clinic. It will not be stored at home. NOTE: This sheet is a summary. It may not cover all possible information. If you have questions about this medicine, talk to your doctor, pharmacist, or health care provider.  2023 [employer] Standard (2021-12-06 00:00:00)

## 2022-08-18 ENCOUNTER — Telehealth: Payer: Self-pay

## 2022-08-18 NOTE — Telephone Encounter (Signed)
-----   Message from Ishmael Holter, RN sent at 08/17/2022  5:46 PM EST ----- Regarding: Dr. Julien Nordmann 1st tx f/u call Dr. Julien Nordmann 1st tx f/u call. Rae Halsted, [place], [place]. Tolerated well

## 2022-08-18 NOTE — Telephone Encounter (Signed)
Gail Jennings states that she is doing fine. She is eating, drinking, and urinating well. She is experiencing some constipation. She has taken Miralax and is waiting for that to work.  She knows to call the office at [phone] if she has any questions or concerns.

## 2022-08-19 ENCOUNTER — Ambulatory Visit: Payer: Medicare Other

## 2022-08-19 ENCOUNTER — Other Ambulatory Visit: Payer: Self-pay | Admitting: Internal Medicine

## 2022-08-19 DIAGNOSIS — C3491 Malignant neoplasm of unspecified part of right bronchus or lung: Secondary | ICD-10-CM

## 2022-08-19 LAB — T4: T4, Total: 6.6 ug/dL (ref 4.5–12.0)

## 2022-08-22 ENCOUNTER — Ambulatory Visit (HOSPITAL_COMMUNITY): Admission: RE | Admit: 2022-08-22 | Payer: Medicare Other | Source: Ambulatory Visit

## 2022-08-22 ENCOUNTER — Telehealth: Payer: Self-pay | Admitting: Medical Oncology

## 2022-08-22 ENCOUNTER — Encounter: Payer: Self-pay | Admitting: Internal Medicine

## 2022-08-22 ENCOUNTER — Other Ambulatory Visit: Payer: Self-pay | Admitting: Medical Oncology

## 2022-08-22 DIAGNOSIS — C3491 Malignant neoplasm of unspecified part of right bronchus or lung: Secondary | ICD-10-CM

## 2022-08-22 NOTE — Telephone Encounter (Signed)
   Ipilimumab (YERVOY) Patient Monitoring Assessment   Is the patient experiencing any of the following general symptoms?:  [ ] Difficulty performing normal activities [ ] Feeling sluggish or cold all the time [ ] Unusual weight gain [ ] Constant or unusual headaches [ ] Feeling dizzy or faint [ ] Changes in eyesight (blurry vision, double vision, or other vision problems) [ ] Changes in mood or behavior (ex: decreased sex drive, irritability, or forgetfulness) [ ] Starting new medications (ex: steroids, other medications that lower immune response) [ x]Patient is not experiencing any of the general symptoms above.   Gastrointestinal  Patient is having 4 bowel movements each day. Started at 5 am today Is this different from baseline? [ x]Yes [ ] No-watery alternate with solid stool Are your stools watery or do they have a foul smell? [ x]Yes [ ] No-watery then normal-watery sometimes Have you seen blood in your stools? [ ] Yes [x ]No Are your stools dark, tarry, or sticky? [ ] Yes [ x]No Are you having pain or tenderness in your belly? [ ] Yes [ x]No  Skin Does your skin itch? [ ] Yes [ x]No Do you have a rash? [ ] Yes [x ]No Has your skin blistered and/or peeled? [ ] Yes [ x]No Do you have sores in your mouth? [ ] Yes [ x]No  Hepatic Has your urine been dark or tea colored? [ x]Yes [ ] No-"My urine is dark" Have you noticed that your skin or the whites of your eyes are turning yellow? [ ] Yes [ x]No Are you bleeding or bruising more easily than normal? [ ] Yes [x ]No Are you nauseous and/or vomiting? [ x]Yes [ ] No Do you have pain on the right side of your stomach? [ ] Yes [ x]No  Neurologic  Are you having unusual weakness of legs, arms, or face? [ ] Yes [ x]No Are you having numbness or tingling in your hands or feet? [ ] Yes [ x]No  Replinish fluids-Pt instructed to hydrate with Gatorade, take imodium tablets 2 after first diarrhea stool of the day then 1 tablet after each stool up to 6 /day. Eat  crackers , soup, jello, bland foods.

## 2022-08-22 NOTE — Progress Notes (Signed)
Called pt to introduce myself as her Arboriculturist and to discuss the [employer].  Pt was unavailable so I spoke to with friend Ms. Emogene Morgan and informed her of the [employer], went over what it covers and gave her the income requirement.  She will discuss it with the pt and get back to me if she would like to apply for the grant.

## 2022-08-23 ENCOUNTER — Other Ambulatory Visit: Payer: Self-pay | Admitting: Medical Oncology

## 2022-08-23 ENCOUNTER — Other Ambulatory Visit: Payer: Self-pay

## 2022-08-23 ENCOUNTER — Encounter: Payer: Self-pay | Admitting: Internal Medicine

## 2022-08-23 ENCOUNTER — Inpatient Hospital Stay (HOSPITAL_BASED_OUTPATIENT_CLINIC_OR_DEPARTMENT_OTHER): Payer: Medicare Other | Admitting: Internal Medicine

## 2022-08-23 ENCOUNTER — Inpatient Hospital Stay: Payer: Medicare Other

## 2022-08-23 VITALS — BP 111/63 | HR 71 | Temp 98.1°F | Resp 16 | Wt 105.3 lb

## 2022-08-23 DIAGNOSIS — Z5111 Encounter for antineoplastic chemotherapy: Secondary | ICD-10-CM | POA: Diagnosis not present

## 2022-08-23 DIAGNOSIS — Z79899 Other long term (current) drug therapy: Secondary | ICD-10-CM | POA: Diagnosis not present

## 2022-08-23 DIAGNOSIS — C3491 Malignant neoplasm of unspecified part of right bronchus or lung: Secondary | ICD-10-CM

## 2022-08-23 DIAGNOSIS — J910 Malignant pleural effusion: Secondary | ICD-10-CM | POA: Diagnosis not present

## 2022-08-23 DIAGNOSIS — C3431 Malignant neoplasm of lower lobe, right bronchus or lung: Secondary | ICD-10-CM | POA: Diagnosis not present

## 2022-08-23 DIAGNOSIS — Z23 Encounter for immunization: Secondary | ICD-10-CM | POA: Diagnosis not present

## 2022-08-23 DIAGNOSIS — Z5112 Encounter for antineoplastic immunotherapy: Secondary | ICD-10-CM | POA: Diagnosis not present

## 2022-08-23 LAB — CMP (CANCER CENTER ONLY)
ALT: 14 U/L (ref 0–44)
AST: 16 U/L (ref 15–41)
Albumin: 1.8 g/dL — ABNORMAL LOW (ref 3.5–5.0)
Alkaline Phosphatase: 85 U/L (ref 38–126)
Anion gap: 8 (ref 5–15)
BUN: 15 mg/dL (ref 8–23)
CO2: 25 mmol/L (ref 22–32)
Calcium: 8.1 mg/dL — ABNORMAL LOW (ref 8.9–10.3)
Chloride: 99 mmol/L (ref 98–111)
Creatinine: 0.54 mg/dL (ref 0.44–1.00)
GFR, Estimated: >60 mL/min (ref >60)
Glucose, Bld: 138 mg/dL — ABNORMAL HIGH (ref 70–99)
Potassium: 3.7 mmol/L (ref 3.5–5.1)
Sodium: 132 mmol/L — ABNORMAL LOW (ref 135–145)
Total Bilirubin: 0.3 mg/dL (ref 0.3–1.2)
Total Protein: 5.6 g/dL — ABNORMAL LOW (ref 6.5–8.1)

## 2022-08-23 LAB — CBC WITH DIFFERENTIAL (CANCER CENTER ONLY)
Abs Immature Granulocytes: 0.02 10*3/uL (ref 0.00–0.07)
Basophils Absolute: 0 10*3/uL (ref 0.0–0.1)
Basophils Relative: 0 %
Eosinophils Absolute: 0.1 10*3/uL (ref 0.0–0.5)
Eosinophils Relative: 1 %
HCT: 32.8 % — ABNORMAL LOW (ref 36.0–46.0)
Hemoglobin: 11.2 g/dL — ABNORMAL LOW (ref 12.0–15.0)
Immature Granulocytes: 0 %
Lymphocytes Relative: 4 %
Lymphs Abs: 0.3 10*3/uL — ABNORMAL LOW (ref 0.7–4.0)
MCH: 31.5 pg (ref 26.0–34.0)
MCHC: 34.1 g/dL (ref 30.0–36.0)
MCV: 92.4 fL (ref 80.0–100.0)
Monocytes Absolute: 0.1 10*3/uL (ref 0.1–1.0)
Monocytes Relative: 2 %
Neutro Abs: 6.2 10*3/uL (ref 1.7–7.7)
Neutrophils Relative %: 93 %
Platelet Count: 309 10*3/uL (ref 150–400)
RBC: 3.55 MIL/uL — ABNORMAL LOW (ref 3.87–5.11)
RDW: 12.2 % (ref 11.5–15.5)
WBC Count: 6.7 10*3/uL (ref 4.0–10.5)
nRBC: 0 % (ref 0.0–0.2)

## 2022-08-23 NOTE — Progress Notes (Signed)
[facility] [phone]   [phone]  OFFICE PROGRESS NOTE  Burnard Bunting, MD [place] [address]  DIAGNOSIS: Stage IV non-small cell lung cancer (T1, N2, M1a) adenocarcinoma.  The patient presented with a masslike hypermetabolic lesion in the right lower lobe and right middle lobe compatibility with primary lung malignancy. She has a malignant right pleural effusion with nodular hypermetabolic activity of the pleura consistent with pleural metastatic disease.  She also has hypermetabolic right hilar and mediastinal lymph nodes.  She was diagnosed in November 2023   Molecular studies:  -Positive for KRAS G12C   PDL1: 5%   PRIOR THERAPY: None    CURRENT THERAPY: Reduced dose carboplatin for an AUC of 4 and Alimta 500 mg/m IV every 3 weeks for 2 cycles of chemotherapy in addition to nivolumab 360 mg every 3 weeks and ipilimumab milligrams per kilogram every 6 weeks.  First dose expected on 08/17/2022.  Status post 1 cycle.  INTERVAL HISTORY: Gail GOSSER 88 y.o. female returns to the clinic today for follow-up visit accompanied by her friend.  Her daughter Haynes Dage as well as her niece [name] were available by phone during the visit.  The patient is feeling fine today with no concerning complaints except for the baseline shortness of breath.  She tolerated the first cycle of her treatment fairly well with 1 episode of nausea and few episodes of diarrhea improved with Imodium.  She was supposed to have ultrasound-guided right thoracentesis yesterday but she could not make it to the appointment because of the diarrhea.  She denied having any recent weight loss or night sweats.  She has no fever or chills.  She had mild swelling of the lower extremities.  She is here today for evaluation and repeat blood work.  MEDICAL HISTORY: Past Medical History:  Diagnosis Date   Atrial fibrillation ([place])    Complication of anesthesia     cannot take anectine   Hypertension    Inguinal hernia     ALLERGIES:  is allergic to codeine, hydrocodone, and succinylcholine chloride.  MEDICATIONS:  Current Outpatient Medications  Medication Sig Dispense Refill   apixaban (ELIQUIS) 2.5 MG TABS tablet Take 1 tablet (2.5 mg total) by mouth 2 (two) times daily. 60 tablet 0   folic acid (FOLVITE) 1 MG tablet Take 1 tablet (1 mg total) by mouth daily. 30 tablet 2   metoprolol tartrate (LOPRESSOR) 25 MG tablet Take 0.5 tablets (12.5 mg total) by mouth 2 (two) times daily. 60 tablet 0   ondansetron (ZOFRAN) 8 MG tablet Take 1 tablet (8 mg total) by mouth every 8 (eight) hours as needed for nausea or vomiting. Start on day 3 after carboplatin. 30 tablet 1   prochlorperazine (COMPAZINE) 10 MG tablet Take 1 tablet (10 mg total) by mouth every 6 (six) hours as needed for nausea or vomiting. 30 tablet 1   No current facility-administered medications for this visit.    SURGICAL HISTORY:  Past Surgical History:  Procedure Laterality Date   ABDOMINAL HYSTERECTOMY     DENTAL SURGERY  Jan 11, 2011   bone graft and extraction   EYE SURGERY     detached retina, cryo, lens implant   INGUINAL HERNIA REPAIR  09/27/2011   Procedure: HERNIA REPAIR INGUINAL INCARCERATED;  Surgeon: Pedro Earls, MD;  Location: [facility];  Service: General;  Laterality: Left;  Left Ingunial hernia repair with mesh   INGUINAL HERNIA REPAIR  Left 10/19/2012   Procedure: LAPAROSCOPIC possible open left femoral hernia repair ;  Surgeon: Pedro Earls, MD;  Location: [facility];  Service: General;  Laterality: Left;  Laparoscopic Left Femoral Hernia Repair with Mesh   IR THORACENTESIS ASP PLEURAL SPACE W/IMG GUIDE  07/27/2022   IR THORACENTESIS ASP PLEURAL SPACE W/IMG GUIDE  08/02/2022   IR THORACENTESIS ASP PLEURAL SPACE W/IMG GUIDE  08/09/2022   MOUTH SURGERY     lt upper bone graft for implants   TONSILLECTOMY     TUBAL LIGATION      REVIEW OF SYSTEMS:   Constitutional: positive for fatigue Eyes: negative Ears, nose, mouth, throat, and face: negative Respiratory: positive for dyspnea on exertion Cardiovascular: negative Gastrointestinal: positive for diarrhea and nausea Genitourinary:negative Integument/breast: negative Hematologic/lymphatic: negative Musculoskeletal:negative Neurological: negative Behavioral/Psych: negative Endocrine: negative Allergic/Immunologic: negative   PHYSICAL EXAMINATION: General appearance: alert, cooperative, fatigued, and no distress Head: Normocephalic, without obvious abnormality, atraumatic Neck: no adenopathy, no JVD, supple, symmetrical, trachea midline, and thyroid not enlarged, symmetric, no tenderness/mass/nodules Lymph nodes: Cervical, supraclavicular, and axillary nodes normal. Resp: diminished breath sounds RLL and dullness to percussion RLL Back: symmetric, no curvature. ROM normal. No CVA tenderness. Cardio: regular rate and rhythm, S1, S2 normal, no murmur, click, rub or gallop GI: soft, non-tender; bowel sounds normal; no masses,  no organomegaly Extremities: extremities normal, atraumatic, no cyanosis or edema Neurologic: Alert and oriented X 3, normal strength and tone. Normal symmetric reflexes. Normal coordination and gait  ECOG PERFORMANCE STATUS: 1 - Symptomatic but completely ambulatory  Blood pressure 111/63, pulse 71, temperature 98.1 F (36.7 C), temperature source Oral, resp. rate 16, weight 105 lb 4.8 oz (47.8 kg), SpO2 98 %.  LABORATORY DATA: Lab Results  Component Value Date   WBC 6.7 08/23/2022   HGB 11.2 (L) 08/23/2022   HCT 32.8 (L) 08/23/2022   MCV 92.4 08/23/2022   PLT 309 08/23/2022      Chemistry      Component Value Date/Time   NA 132 (L) 08/17/2022 1320   K 3.8 08/17/2022 1320   CL 100 08/17/2022 1320   CO2 25 08/17/2022 1320   BUN 13 08/17/2022 1320   CREATININE 0.45 08/17/2022 1320      Component Value Date/Time   CALCIUM 8.7 (L) 08/17/2022 1320    ALKPHOS 96 08/17/2022 1320   AST 11 (L) 08/17/2022 1320   ALT 9 08/17/2022 1320   BILITOT 0.3 08/17/2022 1320       RADIOGRAPHIC STUDIES: US Thoracentesis Asp Pleural space w/IMG guide  Result Date: 08/15/2022 INDICATION: Primary lung adenocarcinoma, recurrent pleural effusion and dyspnea EXAM: ULTRASOUND GUIDED RIGHT THORACENTESIS MEDICATIONS: None. COMPLICATIONS: None immediate. PROCEDURE: An ultrasound guided thoracentesis was thoroughly discussed with the patient and questions answered. The benefits, risks, alternatives and complications were also discussed. The patient understands and wishes to proceed with the procedure. Written consent was obtained. Ultrasound was performed to localize and mark an adequate pocket of fluid in the right chest. The area was then prepped and draped in the normal sterile fashion. 1% Lidocaine was used for local anesthesia. Under ultrasound guidance a 6 Fr Safe-T-Centesis catheter was introduced. Thoracentesis was performed. The catheter was removed and a dressing applied. FINDINGS: A total of approximately 1.3L of amber pleural fluid was removed. IMPRESSION: Successful ultrasound guided right thoracentesis yielding 1.3L of pleural fluid. Performed and dictated by Pasty Spillers, PA-C Electronically Signed   By: Markus Daft M.D.   On: 08/15/2022 12:14   MR Brain W  Wo Contrast  Result Date: 08/15/2022 CLINICAL DATA:  88 year old female with a history of recurrent malignant right pleural effusion. Lung cancer. Staging. EXAM: MRI HEAD WITHOUT AND WITH CONTRAST TECHNIQUE: Multiplanar, multiecho pulse sequences of the brain and surrounding structures were obtained without and with intravenous contrast. CONTRAST:  4.25m GADAVIST GADOBUTROL 1 MMOL/ML IV SOLN COMPARISON:  PET-CT 07/29/2022.  Head CT 06/17/2010. FINDINGS: Brain: No midline shift, mass effect, or evidence of intracranial mass lesion. No abnormal enhancement identified. No dural thickening. Patchy chronic  left lateral cerebellar infarct. Patchy and scattered bilateral cerebral white matter T2 and FLAIR hyperintensity compatible with white matter small vessel ischemia and more pronounced in the right corona radiata. Comparatively mild T2 heterogeneity in the bilateral deep gray nuclei. Right midbrain perivascular space suspected (normal variant). No chronic cerebral blood products. No restricted diffusion to suggest acute infarction. No ventriculomegaly, extra-axial collection or acute intracranial hemorrhage. Cervicomedullary junction and pituitary are within normal limits. Vascular: Major intracranial vascular flow voids are preserved. Generalized intracranial artery tortuosity. The major dural venous sinuses are enhancing and appear to be patent. Skull and upper cervical spine: Bulky degenerative ligamentous hypertrophy about the odontoid. Visualized bone marrow signal is within normal limits. Negative visible cervical spinal cord. Sinuses/Orbits: Postoperative changes to both globes. Otherwise negative orbits. Paranasal sinuses are well aerated. Other: Trace right mastoid air cell fluid, significance doubtful with negative visible nasopharynx. Left mastoids are clear. And other visible internal auditory structures appear normal. IMPRESSION: 1. No metastatic disease or acute intracranial abnormality. 2. Chronic ischemic disease, mostly small vessel type and mild to moderate for age. Electronically Signed   By: HGenevie AnnM.D.   On: 08/15/2022 11:38   DG Chest 1 View  Result Date: 08/15/2022 CLINICAL DATA:  88year old female with a history of recurrent malignant right pleural effusion. Lung cancer. Status post ultrasound-guided thoracentesis this morning. EXAM: CHEST  1 VIEW COMPARISON:  Chest radiographs 08/09/2022 and earlier. FINDINGS: PA view at 1122 hours. Underlying large lung volumes. No pneumothorax identified. Mostly layering right pleural effusion appears smaller. Small volume of fluid now tracking into  the right minor fissure. Mildly improved right lung base ventilation. Stable cardiac size and mediastinal contours. Left lung appears stable. No acute osseous abnormality identified. Calcified aortic atherosclerosis. Paucity of bowel gas in the upper abdomen. IMPRESSION: 1. Decreased right pleural effusion and no pneumothorax following thoracentesis. 2. Known right lung mass.  No new cardiopulmonary abnormality. Electronically Signed   By: HGenevie AnnM.D.   On: 08/15/2022 11:32   IR THORACENTESIS ASP PLEURAL SPACE W/IMG GUIDE  Result Date: 08/09/2022 INDICATION: Lung cancer, recurrent malignant right pleural effusion EXAM: ULTRASOUND GUIDED RIGHT THORACENTESIS MEDICATIONS: 7 cc 1% lidocaine COMPLICATIONS: None immediate. PROCEDURE: An ultrasound guided thoracentesis was thoroughly discussed with the patient and questions answered. The benefits, risks, alternatives and complications were also discussed. The patient understands and wishes to proceed with the procedure. Written consent was obtained. Ultrasound was performed to localize and mark an adequate pocket of fluid in the right chest. The area was then prepped and draped in the normal sterile fashion. 1% Lidocaine was used for local anesthesia. Under ultrasound guidance a 6 Fr Safe-T-Centesis catheter was introduced. Thoracentesis was performed. The catheter was removed and a dressing applied. FINDINGS: A total of approximately 2.0 L of yellow fluid was removed. Ordering provider did not request laboratory samples IMPRESSION: Successful ultrasound guided right thoracentesis yielding 2.0 L of pleural fluid. Follow-up chest x-ray revealed no evidence of pneumothorax. Read by: MRodman Key  Neysa Bonito, PA-C Electronically Signed   By: Markus Daft M.D.   On: 08/09/2022 20:12   DG Chest 1 View  Result Date: 08/09/2022 CLINICAL DATA:  Pleural effusion, status post thoracentesis. EXAM: CHEST  1 VIEW COMPARISON:  08/02/2022 FINDINGS: Stable cardiac contours. Aortic tortuosity.  Interval decrease in the size of a previously noted right pleural effusion, now small to moderate, with improved aeration in the right lower lung. Right basilar airspace opacities, most likely associated atelectasis. The left lung is clear. No definite pneumothorax. No focal pulmonary opacity. No pleural effusion or pneumothorax. No acute osseous abnormality. IMPRESSION: Interval decrease in the size of a previously noted right pleural effusion, now small to moderate, with improved aeration in the right lower lung. No definite pneumothorax. Electronically Signed   By: Merilyn Baba M.D.   On: 08/09/2022 13:42   IR THORACENTESIS ASP PLEURAL SPACE W/IMG GUIDE  Result Date: 08/02/2022 INDICATION: Lung cancer, recurrent pleural effusion EXAM: ULTRASOUND GUIDED RIGHT THORACENTESIS MEDICATIONS: None. COMPLICATIONS: None immediate. PROCEDURE: An ultrasound guided thoracentesis was thoroughly discussed with the patient and questions answered. The benefits, risks, alternatives and complications were also discussed. The patient understands and wishes to proceed with the procedure. Written consent was obtained. Ultrasound was performed to localize and mark an adequate pocket of fluid in the right chest. The area was then prepped and draped in the normal sterile fashion. 1% Lidocaine was used for local anesthesia. Under ultrasound guidance a 6 Fr Safe-T-Centesis catheter was introduced. Thoracentesis was performed. The catheter was removed and a dressing applied. FINDINGS: A total of approximately 1.1L of pleural fluid was removed. Samples were sent to the laboratory as requested by the clinical team. IMPRESSION: Successful ultrasound guided right thoracentesis yielding 1.1L of pleural fluid. Electronically Signed   By: Ruthann Cancer M.D.   On: 08/02/2022 16:25   DG Chest 1 View  Result Date: 08/02/2022 CLINICAL DATA:  Post thoracentesis. EXAM: CHEST  1 VIEW COMPARISON:  Chest radiographs 07/27/2022 and 07/16/2022.  PET-CT 07/29/2022. FINDINGS: 1401 hours. The heart size and mediastinal contours are stable with aortic tortuosity. Right pleural effusion appears mildly decreased in volume. Unchanged right basilar airspace opacities. No evidence of pneumothorax. The left lung is clear. IMPRESSION: Decreased right pleural effusion following thoracentesis. No evidence of pneumothorax. Electronically Signed   By: Richardean Sale M.D.   On: 08/02/2022 14:23   NM PET Image Initial (PI) Skull Base To Thigh  Result Date: 07/31/2022 CLINICAL DATA:  Initial treatment strategy for cancer. EXAM: NUCLEAR MEDICINE PET SKULL BASE TO THIGH TECHNIQUE: 5.4 mCi F-18 FDG was injected intravenously. Full-ring PET imaging was performed from the skull base to thigh after the radiotracer. CT data was obtained and used for attenuation correction and anatomic localization. Fasting blood glucose: 99 mg/dl COMPARISON:  Chest CT dated July 12, 2022 FINDINGS: Mediastinal blood pool activity: SUV max 2.4 Liver activity: SUV max 2.7 NECK: No hypermetabolic lymph nodes in the neck. Incidental CT findings: None. CHEST: Focal masslike hypermetabolic uptake within the right lower lobe with SUV max of 16.5, difficult to measure on CT due to associated atelectasis lack of IV contrast but measures proximally 4.5 x 3.4 cm on series 4, image 81. Additional area of focal masslike hypermetabolic uptake is seen in the right middle lobe SUV max of 20.1 measuring approximately 2.5 x 1.5 cm. Loculated moderate pleural effusion with nodular hypermetabolic activity of the pleura of the right hemithorax. Hypermetabolic activity of the posterior right chest wall. Hypermetabolic right hilar and mediastinal  lymph nodes. Reference hypermetabolic and enlarged right hilar lymph node measuring 1.1 cm in short axis on series 4, image 70 with SUV max of 11.4. Reference hypermetabolic retrotracheal lymph node measuring 8 mm in short axis on series 4, image 54 with SUV max of 5.9.  Incidental CT findings: Moderate atherosclerotic disease of the thoracic aorta. Scattered small solid pulmonary nodules which are below PET-CT resolution. Reference left upper lobe solid nodule measuring 3 mm on series 7, image 19, unchanged in size when compared with the prior. ABDOMEN/PELVIS: No abnormal hypermetabolic activity within the liver, pancreas, adrenal glands, or spleen. No hypermetabolic lymph nodes in the abdomen or pelvis. Incidental CT findings: Diverticulosis. Exophytic simple appearing cyst of the posterior right lobe of the liver. Pelvic right kidney. SKELETON: Focal hypermetabolic uptake of the posterior first left rib with no definite osseous lesion seen on CT. Incidental CT findings: None. IMPRESSION: 1. Focal masslike hypermetabolic uptake within the right lower lobe and right middle lobe, compatible with primary lung malignancy. 2. Loculated moderate right pleural effusion with nodular hypermetabolic activity of the pleura, consistent with pleural metastatic disease. 3. Hypermetabolic right hilar and mediastinal lymph nodes, consistent with metastatic disease. 4. Focal hypermetabolic uptake of the posterior first left rib with no definite osseous lesion seen on CT, concerning for osseous metastatic disease. 5. No evidence of metastatic disease in the abdomen or pelvis. 6. Soft tissue uptake of the right chest wall, likely due to recent thoracentesis. Electronically Signed   By: Yetta Glassman M.D.   On: 07/31/2022 16:55   IR THORACENTESIS ASP PLEURAL SPACE W/IMG GUIDE  Result Date: 07/27/2022 INDICATION: Lung cancer with recurrent right pleural effusion. Request for diagnostic and therapeutic thoracentesis. EXAM: ULTRASOUND GUIDED RIGHT THORACENTESIS MEDICATIONS: 1% lidocaine 6 mL COMPLICATIONS: None immediate. PROCEDURE: An ultrasound guided thoracentesis was thoroughly discussed with the patient and questions answered. The benefits, risks, alternatives and complications were also  discussed. The patient understands and wishes to proceed with the procedure. Written consent was obtained. Ultrasound was performed to localize and mark an adequate pocket of fluid in the right chest. The area was then prepped and draped in the normal sterile fashion. 1% Lidocaine was used for local anesthesia. Under ultrasound guidance a 6 Fr Safe-T-Centesis catheter was introduced. Thoracentesis was performed. The catheter was removed and a dressing applied. FINDINGS: A total of approximately 1.7 L of amber fluid was removed. Samples were sent to the laboratory as requested by the clinical team. IMPRESSION: Successful ultrasound guided right thoracentesis yielding 1.7 L of pleural fluid. No pneumothorax on post-procedure chest x-ray. Procedure performed by: Gareth Eagle, PA-C Electronically Signed   By: Ruthann Cancer M.D.   On: 07/27/2022 16:47   DG Chest 1 View  Result Date: 07/27/2022 CLINICAL DATA:  Right pleural effusion.  Status post thoracentesis. EXAM: CHEST  1 VIEW COMPARISON:  Chest radiograph 07/16/2022 FINDINGS: The cardiomediastinal silhouette is unchanged. There is a small to moderate residual right pleural effusion with basilar atelectasis. No residual pneumothorax is evident. The left lung is clear. No acute osseous abnormality is seen. IMPRESSION: Small to moderate residual right pleural effusion with basilar atelectasis. No pneumothorax. Electronically Signed   By: Logan Bores M.D.   On: 07/27/2022 13:40    ASSESSMENT AND PLAN: This is a very pleasant 88 years old white female with Stage IV non-small cell lung cancer (T1, N2, M1a) adenocarcinoma.  The patient presented with a masslike hypermetabolic lesion in the right lower lobe and right middle lobe compatibility with  primary lung malignancy. She has a malignant right pleural effusion with nodular hypermetabolic activity of the pleura consistent with pleural metastatic disease.  She also has hypermetabolic right hilar and mediastinal  lymph nodes.  She was diagnosed in November 2023.  Molecular studies were positive for KRAS G12C mutation and PD-L1 expression was 5%. The patient is currently undergoing systemic treatment with reduced dose carboplatin for AUC of 4 and Alimta 400 Mg/M2 in addition to Ipilumumab 1 Mg/KG every 6 weeks and nivolumab 360 Mg IV every 3 weeks.  The chemotherapy will be for the first 2 cycles followed by maintenance treatment with ipilimumab and nivolumab.  She started the first cycle of her treatment on August 17, 2022.  She tolerated the first cycle fairly well with no concerning adverse effect except for 1 or 2 episodes of nausea and few episodes of diarrhea improved with Imodium. The patient continues to have shortness of breath secondary to recurrent right pleural effusion. I will arrange for her to have ultrasound-guided therapeutic right thoracentesis this week and also next week. I will see her back for follow-up visit in 2 weeks for evaluation before starting cycle #2 of her treatment. For the nausea the patient will continue on Compazine on as-needed basis. For the lack of appetite and poor nutrition, she was seen by the dietitian at the cancer center and she will try some of the nutritional supplements. The patient was advised to call immediately if she has any other concerning symptoms in the interval. The patient voices understanding of current disease status and treatment options and is in agreement with the current care plan.  All questions were answered. The patient knows to call the clinic with any problems, questions or concerns. We can certainly see the patient much sooner if necessary.  The total time spent in the appointment was 30 minutes.  Disclaimer: This note was dictated with voice recognition software. Similar sounding words can inadvertently be transcribed and may not be corrected upon review.

## 2022-08-23 NOTE — Addendum Note (Signed)
Addended by: Ardeen Garland on: 08/23/2022 10:12 AM   Modules accepted: Orders

## 2022-08-24 ENCOUNTER — Ambulatory Visit (HOSPITAL_COMMUNITY)
Admission: RE | Admit: 2022-08-24 | Discharge: 2022-08-24 | Disposition: A | Discharge status: Home / Self Care | Payer: Medicare Other | Source: Ambulatory Visit | Attending: Internal Medicine | Admitting: Internal Medicine

## 2022-08-24 ENCOUNTER — Other Ambulatory Visit: Payer: Self-pay

## 2022-08-24 ENCOUNTER — Ambulatory Visit (HOSPITAL_COMMUNITY)
Admission: RE | Admit: 2022-08-24 | Discharge: 2022-08-24 | Disposition: A | Discharge status: Home / Self Care | Payer: Medicare Other | Source: Ambulatory Visit | Attending: Radiology | Admitting: Radiology

## 2022-08-24 DIAGNOSIS — C3490 Malignant neoplasm of unspecified part of unspecified bronchus or lung: Secondary | ICD-10-CM | POA: Insufficient documentation

## 2022-08-24 DIAGNOSIS — J90 Pleural effusion, not elsewhere classified: Secondary | ICD-10-CM

## 2022-08-24 DIAGNOSIS — R918 Other nonspecific abnormal finding of lung field: Secondary | ICD-10-CM | POA: Diagnosis not present

## 2022-08-24 DIAGNOSIS — Z9889 Other specified postprocedural states: Secondary | ICD-10-CM | POA: Diagnosis not present

## 2022-08-24 DIAGNOSIS — J910 Malignant pleural effusion: Secondary | ICD-10-CM | POA: Insufficient documentation

## 2022-08-24 HISTORY — PX: IR THORACENTESIS ASP PLEURAL SPACE W/IMG GUIDE: IMG5380

## 2022-08-24 MED ORDER — LIDOCAINE HCL 1 % IJ SOLN
INTRAMUSCULAR | Status: AC
  Filled 2022-08-24: qty 20

## 2022-08-24 NOTE — Procedures (Signed)
PROCEDURE SUMMARY:  Successful US guided right thoracentesis. Yielded 1.3 L amber colored fluid. Pt tolerated procedure well. No immediate complications. Fluid was loculated today, however drained well.  Specimen was not sent for labs. CXR ordered.  EBL < 1 mL  Rockney Ghee 08/24/2022 10:47 AM

## 2022-08-25 ENCOUNTER — Other Ambulatory Visit: Payer: Self-pay

## 2022-08-25 ENCOUNTER — Ambulatory Visit (HOSPITAL_COMMUNITY): Admission: RE | Admit: 2022-08-25 | Payer: Medicare Other | Source: Ambulatory Visit

## 2022-08-30 ENCOUNTER — Ambulatory Visit (HOSPITAL_COMMUNITY)
Admission: RE | Admit: 2022-08-30 | Discharge: 2022-08-30 | Disposition: A | Discharge status: Home / Self Care | Payer: Medicare Other | Source: Ambulatory Visit | Attending: Internal Medicine | Admitting: Internal Medicine

## 2022-08-30 ENCOUNTER — Encounter: Payer: Self-pay | Admitting: Nurse Practitioner

## 2022-08-30 VITALS — BP 117/48

## 2022-08-30 DIAGNOSIS — J910 Malignant pleural effusion: Secondary | ICD-10-CM | POA: Diagnosis not present

## 2022-08-30 DIAGNOSIS — F419 Anxiety disorder, unspecified: Secondary | ICD-10-CM | POA: Diagnosis not present

## 2022-08-30 DIAGNOSIS — J9601 Acute respiratory failure with hypoxia: Secondary | ICD-10-CM | POA: Diagnosis not present

## 2022-08-30 DIAGNOSIS — R0600 Dyspnea, unspecified: Secondary | ICD-10-CM | POA: Diagnosis not present

## 2022-08-30 DIAGNOSIS — R54 Age-related physical debility: Secondary | ICD-10-CM | POA: Diagnosis present

## 2022-08-30 DIAGNOSIS — R64 Cachexia: Secondary | ICD-10-CM | POA: Diagnosis present

## 2022-08-30 DIAGNOSIS — Z85110 Personal history of malignant carcinoid tumor of bronchus and lung: Secondary | ICD-10-CM | POA: Diagnosis not present

## 2022-08-30 DIAGNOSIS — I700 Atherosclerosis of aorta: Secondary | ICD-10-CM | POA: Diagnosis not present

## 2022-08-30 DIAGNOSIS — J439 Emphysema, unspecified: Secondary | ICD-10-CM | POA: Diagnosis not present

## 2022-08-30 DIAGNOSIS — E43 Unspecified severe protein-calorie malnutrition: Secondary | ICD-10-CM | POA: Diagnosis not present

## 2022-08-30 DIAGNOSIS — I480 Paroxysmal atrial fibrillation: Secondary | ICD-10-CM | POA: Diagnosis not present

## 2022-08-30 DIAGNOSIS — Z978 Presence of other specified devices: Secondary | ICD-10-CM | POA: Diagnosis not present

## 2022-08-30 DIAGNOSIS — Z515 Encounter for palliative care: Secondary | ICD-10-CM | POA: Diagnosis not present

## 2022-08-30 DIAGNOSIS — Z79899 Other long term (current) drug therapy: Secondary | ICD-10-CM | POA: Diagnosis not present

## 2022-08-30 DIAGNOSIS — J90 Pleural effusion, not elsewhere classified: Secondary | ICD-10-CM

## 2022-08-30 DIAGNOSIS — J869 Pyothorax without fistula: Secondary | ICD-10-CM | POA: Diagnosis not present

## 2022-08-30 DIAGNOSIS — C3490 Malignant neoplasm of unspecified part of unspecified bronchus or lung: Secondary | ICD-10-CM | POA: Diagnosis not present

## 2022-08-30 DIAGNOSIS — R531 Weakness: Secondary | ICD-10-CM | POA: Diagnosis not present

## 2022-08-30 DIAGNOSIS — J9811 Atelectasis: Secondary | ICD-10-CM | POA: Diagnosis not present

## 2022-08-30 DIAGNOSIS — Z7189 Other specified counseling: Secondary | ICD-10-CM | POA: Diagnosis not present

## 2022-08-30 DIAGNOSIS — C3491 Malignant neoplasm of unspecified part of right bronchus or lung: Secondary | ICD-10-CM | POA: Diagnosis not present

## 2022-08-30 DIAGNOSIS — R0602 Shortness of breath: Secondary | ICD-10-CM | POA: Diagnosis not present

## 2022-08-30 DIAGNOSIS — Z7901 Long term (current) use of anticoagulants: Secondary | ICD-10-CM | POA: Diagnosis not present

## 2022-08-30 DIAGNOSIS — J939 Pneumothorax, unspecified: Secondary | ICD-10-CM | POA: Diagnosis not present

## 2022-08-30 DIAGNOSIS — J189 Pneumonia, unspecified organism: Secondary | ICD-10-CM | POA: Diagnosis present

## 2022-08-30 DIAGNOSIS — J9801 Acute bronchospasm: Secondary | ICD-10-CM | POA: Diagnosis not present

## 2022-08-30 DIAGNOSIS — I10 Essential (primary) hypertension: Secondary | ICD-10-CM | POA: Diagnosis not present

## 2022-08-30 DIAGNOSIS — R0902 Hypoxemia: Secondary | ICD-10-CM | POA: Diagnosis not present

## 2022-08-30 DIAGNOSIS — Z66 Do not resuscitate: Secondary | ICD-10-CM | POA: Diagnosis not present

## 2022-08-30 DIAGNOSIS — Z681 Body mass index (BMI) 19 or less, adult: Secondary | ICD-10-CM | POA: Diagnosis not present

## 2022-08-30 DIAGNOSIS — Z87891 Personal history of nicotine dependence: Secondary | ICD-10-CM | POA: Diagnosis not present

## 2022-08-30 DIAGNOSIS — R000 Tachycardia, unspecified: Secondary | ICD-10-CM | POA: Diagnosis not present

## 2022-08-30 DIAGNOSIS — Z1152 Encounter for screening for COVID-19: Secondary | ICD-10-CM | POA: Diagnosis not present

## 2022-08-30 DIAGNOSIS — Z7401 Bed confinement status: Secondary | ICD-10-CM | POA: Diagnosis not present

## 2022-08-30 DIAGNOSIS — Z888 Allergy status to other drugs, medicaments and biological substances status: Secondary | ICD-10-CM | POA: Diagnosis not present

## 2022-08-30 DIAGNOSIS — I513 Intracardiac thrombosis, not elsewhere classified: Secondary | ICD-10-CM | POA: Diagnosis present

## 2022-08-30 DIAGNOSIS — Z885 Allergy status to narcotic agent status: Secondary | ICD-10-CM | POA: Diagnosis not present

## 2022-08-30 DIAGNOSIS — Z820 Family history of epilepsy and other diseases of the nervous system: Secondary | ICD-10-CM | POA: Diagnosis not present

## 2022-08-30 DIAGNOSIS — D6959 Other secondary thrombocytopenia: Secondary | ICD-10-CM | POA: Diagnosis not present

## 2022-08-30 HISTORY — PX: IR THORACENTESIS ASP PLEURAL SPACE W/IMG GUIDE: IMG5380

## 2022-08-30 MED ORDER — LIDOCAINE HCL 1 % IJ SOLN
INTRAMUSCULAR | Status: AC
  Administered 2022-08-30: 10 mL
  Filled 2022-08-30: qty 20

## 2022-08-30 NOTE — Progress Notes (Signed)
Pt underwent right therapeutic thoracentesis in IR today. Post CXR findings:  Narrative & Impression  CLINICAL DATA:  Status post right thoracentesis.   EXAM: CHEST  1 VIEW   COMPARISON:  August 24, 2022 chest x-ray   FINDINGS: There is a tiny amount of air in the right pleural space at the apex, introduced at the time of thoracentesis. This is not unexpected. The right-sided pleural effusion is smaller. Remaining effusion and opacity on the right is otherwise stable. The cardiomediastinal silhouette is stable. The left lung is clear. Smaller   IMPRESSION: 1. There is a tiny amount of air in the right pleural space at the apex, introduced at the time of thoracentesis. This is not unexpected. 2. The right-sided pleural effusion is smaller in the interval. 3. Remaining opacity and effusion on the right is stable    Discussed findings with Dr. Vernard Gambles who recommends observation for 2 hours, assess patient and then d/c home if no new symptoms.  Pt was advised of findings. She states "I feel better that I did when I came in". Pt states that she wants to go home and not be observed for 2 hours.  She denies CP, SOB, back/shoulder pain.   Pt educated that she can go home but to return to ED if she has CP, SOB, back pain or other concerning/new onset of symptoms.  Pt and friend verbalize understanding.   Narda Rutherford, AGNP-BC 08/30/2022, 2:31 PM

## 2022-08-30 NOTE — Procedures (Signed)
PROCEDURE SUMMARY:  Successful US guided therapeutic right thoracentesis. Yielded 650 cc of hazy, amber fluid. Pt tolerated procedure well. No immediate complications.  Specimen not sent for labs. CXR ordered.  EBL < 1 mL  Tyson Alias, AGNP 08/30/2022 1:37 PM

## 2022-09-02 ENCOUNTER — Emergency Department (HOSPITAL_COMMUNITY): Payer: Medicare Other

## 2022-09-02 ENCOUNTER — Inpatient Hospital Stay (HOSPITAL_COMMUNITY)
Admission: EM | Admit: 2022-09-02 | Discharge: 2022-09-16 | DRG: 177 | Disposition: A | Discharge status: Hospice | Payer: Medicare Other | Attending: Internal Medicine | Admitting: Internal Medicine

## 2022-09-02 ENCOUNTER — Other Ambulatory Visit: Payer: Self-pay | Admitting: Radiology

## 2022-09-02 ENCOUNTER — Telehealth: Payer: Self-pay

## 2022-09-02 ENCOUNTER — Encounter (HOSPITAL_COMMUNITY): Payer: Self-pay

## 2022-09-02 DIAGNOSIS — Z515 Encounter for palliative care: Secondary | ICD-10-CM

## 2022-09-02 DIAGNOSIS — Z87891 Personal history of nicotine dependence: Secondary | ICD-10-CM | POA: Diagnosis not present

## 2022-09-02 DIAGNOSIS — I480 Paroxysmal atrial fibrillation: Secondary | ICD-10-CM | POA: Diagnosis present

## 2022-09-02 DIAGNOSIS — J9801 Acute bronchospasm: Secondary | ICD-10-CM | POA: Diagnosis not present

## 2022-09-02 DIAGNOSIS — J869 Pyothorax without fistula: Secondary | ICD-10-CM | POA: Diagnosis present

## 2022-09-02 DIAGNOSIS — J90 Pleural effusion, not elsewhere classified: Secondary | ICD-10-CM | POA: Diagnosis not present

## 2022-09-02 DIAGNOSIS — E43 Unspecified severe protein-calorie malnutrition: Secondary | ICD-10-CM | POA: Diagnosis present

## 2022-09-02 DIAGNOSIS — I700 Atherosclerosis of aorta: Secondary | ICD-10-CM | POA: Diagnosis not present

## 2022-09-02 DIAGNOSIS — J189 Pneumonia, unspecified organism: Secondary | ICD-10-CM | POA: Diagnosis present

## 2022-09-02 DIAGNOSIS — Z820 Family history of epilepsy and other diseases of the nervous system: Secondary | ICD-10-CM | POA: Diagnosis not present

## 2022-09-02 DIAGNOSIS — R339 Retention of urine, unspecified: Secondary | ICD-10-CM | POA: Diagnosis not present

## 2022-09-02 DIAGNOSIS — Z888 Allergy status to other drugs, medicaments and biological substances status: Secondary | ICD-10-CM | POA: Diagnosis not present

## 2022-09-02 DIAGNOSIS — Z7401 Bed confinement status: Secondary | ICD-10-CM

## 2022-09-02 DIAGNOSIS — Z7901 Long term (current) use of anticoagulants: Secondary | ICD-10-CM

## 2022-09-02 DIAGNOSIS — R531 Weakness: Secondary | ICD-10-CM | POA: Diagnosis not present

## 2022-09-02 DIAGNOSIS — R54 Age-related physical debility: Secondary | ICD-10-CM | POA: Diagnosis present

## 2022-09-02 DIAGNOSIS — Z79899 Other long term (current) drug therapy: Secondary | ICD-10-CM

## 2022-09-02 DIAGNOSIS — R64 Cachexia: Secondary | ICD-10-CM | POA: Diagnosis present

## 2022-09-02 DIAGNOSIS — Z885 Allergy status to narcotic agent status: Secondary | ICD-10-CM | POA: Diagnosis not present

## 2022-09-02 DIAGNOSIS — J9601 Acute respiratory failure with hypoxia: Secondary | ICD-10-CM | POA: Diagnosis not present

## 2022-09-02 DIAGNOSIS — J939 Pneumothorax, unspecified: Secondary | ICD-10-CM | POA: Diagnosis not present

## 2022-09-02 DIAGNOSIS — F419 Anxiety disorder, unspecified: Secondary | ICD-10-CM | POA: Diagnosis present

## 2022-09-02 DIAGNOSIS — J439 Emphysema, unspecified: Secondary | ICD-10-CM | POA: Diagnosis not present

## 2022-09-02 DIAGNOSIS — Z66 Do not resuscitate: Secondary | ICD-10-CM | POA: Diagnosis present

## 2022-09-02 DIAGNOSIS — Z681 Body mass index (BMI) 19 or less, adult: Secondary | ICD-10-CM

## 2022-09-02 DIAGNOSIS — R000 Tachycardia, unspecified: Secondary | ICD-10-CM | POA: Diagnosis not present

## 2022-09-02 DIAGNOSIS — Z85110 Personal history of malignant carcinoid tumor of bronchus and lung: Secondary | ICD-10-CM | POA: Diagnosis not present

## 2022-09-02 DIAGNOSIS — R0600 Dyspnea, unspecified: Secondary | ICD-10-CM | POA: Diagnosis not present

## 2022-09-02 DIAGNOSIS — I513 Intracardiac thrombosis, not elsewhere classified: Secondary | ICD-10-CM | POA: Diagnosis present

## 2022-09-02 DIAGNOSIS — Z978 Presence of other specified devices: Secondary | ICD-10-CM | POA: Diagnosis not present

## 2022-09-02 DIAGNOSIS — I10 Essential (primary) hypertension: Secondary | ICD-10-CM | POA: Diagnosis present

## 2022-09-02 DIAGNOSIS — R0602 Shortness of breath: Secondary | ICD-10-CM | POA: Diagnosis present

## 2022-09-02 DIAGNOSIS — J910 Malignant pleural effusion: Secondary | ICD-10-CM | POA: Diagnosis present

## 2022-09-02 DIAGNOSIS — Z7189 Other specified counseling: Secondary | ICD-10-CM

## 2022-09-02 DIAGNOSIS — C3491 Malignant neoplasm of unspecified part of right bronchus or lung: Secondary | ICD-10-CM | POA: Diagnosis present

## 2022-09-02 DIAGNOSIS — Z1152 Encounter for screening for COVID-19: Secondary | ICD-10-CM | POA: Diagnosis not present

## 2022-09-02 DIAGNOSIS — C3490 Malignant neoplasm of unspecified part of unspecified bronchus or lung: Secondary | ICD-10-CM | POA: Diagnosis not present

## 2022-09-02 DIAGNOSIS — D6959 Other secondary thrombocytopenia: Secondary | ICD-10-CM | POA: Diagnosis not present

## 2022-09-02 DIAGNOSIS — R0902 Hypoxemia: Secondary | ICD-10-CM | POA: Diagnosis not present

## 2022-09-02 DIAGNOSIS — J9811 Atelectasis: Secondary | ICD-10-CM | POA: Diagnosis not present

## 2022-09-02 LAB — COMPREHENSIVE METABOLIC PANEL
ALT: 13 U/L (ref 0–44)
AST: 20 U/L (ref 15–41)
Albumin: 2.2 g/dL — ABNORMAL LOW (ref 3.5–5.0)
Alkaline Phosphatase: 75 U/L (ref 38–126)
Anion gap: 11 (ref 5–15)
BUN: 16 mg/dL (ref 8–23)
CO2: 24 mmol/L (ref 22–32)
Calcium: 8.5 mg/dL — ABNORMAL LOW (ref 8.9–10.3)
Chloride: 95 mmol/L — ABNORMAL LOW (ref 98–111)
Creatinine, Ser: 0.58 mg/dL (ref 0.44–1.00)
GFR, Estimated: >60 mL/min (ref >60)
Glucose, Bld: 110 mg/dL — ABNORMAL HIGH (ref 70–99)
Potassium: 4.7 mmol/L (ref 3.5–5.1)
Sodium: 130 mmol/L — ABNORMAL LOW (ref 135–145)
Total Bilirubin: 1 mg/dL (ref 0.3–1.2)
Total Protein: 6.1 g/dL — ABNORMAL LOW (ref 6.5–8.1)

## 2022-09-02 LAB — CBC WITH DIFFERENTIAL/PLATELET
Abs Immature Granulocytes: 0.12 10*3/uL — ABNORMAL HIGH (ref 0.00–0.07)
Basophils Absolute: 0 10*3/uL (ref 0.0–0.1)
Basophils Relative: 0 %
Eosinophils Absolute: 0 10*3/uL (ref 0.0–0.5)
Eosinophils Relative: 0 %
HCT: 32 % — ABNORMAL LOW (ref 36.0–46.0)
Hemoglobin: 10.6 g/dL — ABNORMAL LOW (ref 12.0–15.0)
Immature Granulocytes: 2 %
Lymphocytes Relative: 5 %
Lymphs Abs: 0.4 10*3/uL — ABNORMAL LOW (ref 0.7–4.0)
MCH: 31.1 pg (ref 26.0–34.0)
MCHC: 33.1 g/dL (ref 30.0–36.0)
MCV: 93.8 fL (ref 80.0–100.0)
Monocytes Absolute: 1.8 10*3/uL — ABNORMAL HIGH (ref 0.1–1.0)
Monocytes Relative: 24 %
Neutro Abs: 5.1 10*3/uL (ref 1.7–7.7)
Neutrophils Relative %: 69 %
Platelets: 632 10*3/uL — ABNORMAL HIGH (ref 150–400)
RBC: 3.41 MIL/uL — ABNORMAL LOW (ref 3.87–5.11)
RDW: 13.1 % (ref 11.5–15.5)
WBC: 7.4 10*3/uL (ref 4.0–10.5)
nRBC: 0 % (ref 0.0–0.2)

## 2022-09-02 LAB — PROTIME-INR
INR: 1 (ref 0.8–1.2)
INR: 1.2 (ref 0.8–1.2)
Prothrombin Time: 13.1 seconds (ref 11.4–15.2)
Prothrombin Time: 15.3 seconds — ABNORMAL HIGH (ref 11.4–15.2)

## 2022-09-02 LAB — RESP PANEL BY RT-PCR (RSV, FLU A&B, COVID)  RVPGX2
Influenza A by PCR: NEGATIVE
Influenza B by PCR: NEGATIVE
Resp Syncytial Virus by PCR: NEGATIVE
SARS Coronavirus 2 by RT PCR: NEGATIVE

## 2022-09-02 LAB — BRAIN NATRIURETIC PEPTIDE: B Natriuretic Peptide: 120.5 pg/mL — ABNORMAL HIGH (ref 0.0–100.0)

## 2022-09-02 LAB — HEPARIN LEVEL (UNFRACTIONATED): Heparin Unfractionated: 0.44 IU/mL (ref 0.30–0.70)

## 2022-09-02 MED ORDER — VANCOMYCIN HCL 1250 MG/250ML IV SOLN
1250.0000 mg | INTRAVENOUS | Status: DC
  Administered 2022-09-04 – 2022-09-06 (×2): 1250 mg via INTRAVENOUS
  Filled 2022-09-02 (×2): qty 250

## 2022-09-02 MED ORDER — VANCOMYCIN HCL IN DEXTROSE 1-5 GM/200ML-% IV SOLN
1000.0000 mg | Freq: Once | INTRAVENOUS | Status: AC
  Administered 2022-09-02: 1000 mg via INTRAVENOUS
  Filled 2022-09-02: qty 200

## 2022-09-02 MED ORDER — FOLIC ACID 1 MG PO TABS
1.0000 mg | ORAL_TABLET | Freq: Every day | ORAL | Status: DC
  Administered 2022-09-02 – 2022-09-14 (×13): 1 mg via ORAL
  Filled 2022-09-02 (×14): qty 1

## 2022-09-02 MED ORDER — PROCHLORPERAZINE MALEATE 10 MG PO TABS: 10.0000 mg | ORAL_TABLET | Freq: Four times a day (QID) | ORAL | Status: DC | PRN

## 2022-09-02 MED ORDER — SODIUM CHLORIDE 0.9 % IV SOLN
2.0000 g | Freq: Two times a day (BID) | INTRAVENOUS | Status: DC
  Administered 2022-09-03 – 2022-09-07 (×9): 2 g via INTRAVENOUS
  Filled 2022-09-02 (×9): qty 12.5

## 2022-09-02 MED ORDER — IOHEXOL 300 MG/ML  SOLN
75.0000 mL | Freq: Once | INTRAMUSCULAR | Status: AC | PRN
  Administered 2022-09-02: 75 mL via INTRAVENOUS

## 2022-09-02 MED ORDER — HEPARIN (PORCINE) 25000 UT/250ML-% IV SOLN
1100.0000 [IU]/h | INTRAVENOUS | Status: AC
  Administered 2022-09-02: 800 [IU]/h via INTRAVENOUS
  Administered 2022-09-03: 1000 [IU]/h via INTRAVENOUS
  Filled 2022-09-02 (×2): qty 250

## 2022-09-02 MED ORDER — METOPROLOL TARTRATE 12.5 MG HALF TABLET
12.5000 mg | ORAL_TABLET | Freq: Two times a day (BID) | ORAL | Status: DC
  Administered 2022-09-02 – 2022-09-04 (×5): 12.5 mg via ORAL
  Filled 2022-09-02 (×5): qty 1

## 2022-09-02 MED ORDER — HEPARIN BOLUS VIA INFUSION
3500.0000 [IU] | Freq: Once | INTRAVENOUS | Status: AC
  Administered 2022-09-02: 3500 [IU] via INTRAVENOUS
  Filled 2022-09-02: qty 3500

## 2022-09-02 MED ORDER — PIPERACILLIN-TAZOBACTAM 3.375 G IVPB 30 MIN
3.3750 g | Freq: Once | INTRAVENOUS | Status: AC
  Administered 2022-09-02: 3.375 g via INTRAVENOUS
  Filled 2022-09-02: qty 50

## 2022-09-02 NOTE — Progress Notes (Signed)
A consult was received from an ED physician for Gail Jennings per pharmacy dosing.  The patient's profile has been reviewed for ht/wt/allergies/indication/available labs.   A one time order has been placed for Vanco 1g x 1 and Zosyn 3.375g IV x 1.  Further antibiotics/pharmacy consults should be ordered by admitting physician if indicated.                        Kelen Laura S. Alford Highland, PharmD, BCPS Clinical Staff Pharmacist [email or web address] Thank you, Wayland Salinas 09/02/2022  4:25 PM

## 2022-09-02 NOTE — ED Notes (Signed)
On initial assessment, pt noted to by tachypneic mid 20's-30's and satruations 90-93%, placed on 2 liters 02

## 2022-09-02 NOTE — ED Provider Notes (Signed)
[facility] DEPT Provider Note  CSN: [id number] Arrival date & time: 09/02/22 1201  Chief Complaint(s) Shortness of Breath  HPI Gail Jennings is a 88 y.o. female with past medical history as below, significant for afib not on AC, primary lung adenocarcinoma with recurrent pleural effusion to right, hyopnatremia who presents to the ED with complaint of dib. Pt had thoracentesis via IR 12/19, did not retrieve the typical volume of fluid she will typically have drawn, it was around 661ml per caregiver at bedside, normally she has >1L removed. Has been having progressively worsening difficulty with breathing over the past week, malaise, exertional dib. No fevers or chills, no sig chest pain reported. Feels her legs are more swollen than typical. No change to her urination. Tolerating PO w/o difficulty. Last ate around 0830 this am with english muffin. No thinners. Pt had to be assisted with 3 staff members to ambulate from wheelchair to gurney. She lives alone. Friend at bedside helps to provide history.   Follows w/ Dr Julien Nordmann oncology; scheduled for port placement next week for immunotherapy. On chemo o/w  Last thora was 12/19  Past Medical History Past Medical History:  Diagnosis Date   Atrial fibrillation ([place])    Complication of anesthesia    cannot take anectine   Hypertension    Inguinal hernia    Patient Active Problem List   Diagnosis Date Noted   Empyema ([place]) 09/02/2022   Goals of care, counseling/discussion 08/10/2022   Primary lung adenocarcinoma, right ([place]) 07/17/2022   Atrial fibrillation with RVR ([place]) 07/09/2022   Respiratory distress 07/09/2022   Pleural effusion on right 07/09/2022   Essential hypertension 07/09/2022   Chronic hyponatremia 07/09/2022   Paroxysmal A-fib ([place])    History of left indirect inguinal hernia and lap repair left femoral hernia 11/22/2012   Home Medication(s) Prior to Admission medications   Medication  Sig Start Date End Date Taking? Authorizing Provider  apixaban (ELIQUIS) 2.5 MG TABS tablet Take 1 tablet (2.5 mg total) by mouth 2 (two) times daily. 07/17/22   [name], MD  folic acid (FOLVITE) 1 MG tablet Take 1 tablet (1 mg total) by mouth daily. 08/10/22   Heilingoetter, Cassandra L, PA-C  metoprolol tartrate (LOPRESSOR) 25 MG tablet Take 0.5 tablets (12.5 mg total) by mouth 2 (two) times daily. 07/17/22   [name], MD  ondansetron (ZOFRAN) 8 MG tablet Take 1 tablet (8 mg total) by mouth every 8 (eight) hours as needed for nausea or vomiting. Start on day 3 after carboplatin. Patient not taking: Reported on 08/23/2022 08/16/22   Curt Bears, MD  prochlorperazine (COMPAZINE) 10 MG tablet Take 1 tablet (10 mg total) by mouth every 6 (six) hours as needed for nausea or vomiting. 08/16/22   Curt Bears, MD  Past Surgical History Past Surgical History:  Procedure Laterality Date   ABDOMINAL HYSTERECTOMY     DENTAL SURGERY  Jan 11, 2011   bone graft and extraction   EYE SURGERY     detached retina, cryo, lens implant   INGUINAL HERNIA REPAIR  09/27/2011   Procedure: HERNIA REPAIR INGUINAL INCARCERATED;  Surgeon: Pedro Earls, MD;  Location: [facility];  Service: General;  Laterality: Left;  Left Ingunial hernia repair with mesh   INGUINAL HERNIA REPAIR Left 10/19/2012   Procedure: LAPAROSCOPIC possible open left femoral hernia repair ;  Surgeon: Pedro Earls, MD;  Location: [facility];  Service: General;  Laterality: Left;  Laparoscopic Left Femoral Hernia Repair with Mesh   IR THORACENTESIS ASP PLEURAL SPACE W/IMG GUIDE  07/27/2022   IR THORACENTESIS ASP PLEURAL SPACE W/IMG GUIDE  08/02/2022   IR THORACENTESIS ASP PLEURAL SPACE W/IMG GUIDE  08/09/2022   IR THORACENTESIS ASP PLEURAL SPACE W/IMG GUIDE  08/24/2022   IR THORACENTESIS  ASP PLEURAL SPACE W/IMG GUIDE  08/30/2022   MOUTH SURGERY     lt upper bone graft for implants   TONSILLECTOMY     TUBAL LIGATION     Family History Family History  Problem Relation Age of Onset   Parkinson's disease Mother    Healthy Sister     Social History Social History   Tobacco Use   Smoking status: Former    Packs/day: 0.50    Years: 20.00    Total pack years: 10.00    Types: Cigarettes    Quit date: 09/21/1970    Years since quitting: 51.9   Smokeless tobacco: Never  Substance Use Topics   Alcohol use: Yes    Alcohol/week: 2.0 standard drinks of alcohol    Types: 2 Glasses of wine per week    Comment: occasional   Drug use: No   Allergies Codeine, Hydrocodone, and Succinylcholine chloride  Review of Systems Review of Systems  Constitutional:  Positive for fatigue. Negative for chills and fever.  HENT:  Negative for facial swelling and trouble swallowing.   Eyes:  Negative for photophobia and visual disturbance.  Respiratory:  Positive for shortness of breath. Negative for cough.   Cardiovascular:  Positive for leg swelling. Negative for chest pain and palpitations.  Gastrointestinal:  Negative for abdominal pain, nausea and vomiting.  Endocrine: Negative for polydipsia and polyuria.  Genitourinary:  Negative for difficulty urinating and hematuria.  Musculoskeletal:  Negative for gait problem and joint swelling.  Skin:  Negative for pallor and rash.  Neurological:  Negative for syncope and headaches.  Psychiatric/Behavioral:  Negative for agitation and confusion.     Physical Exam Vital Signs  I have reviewed the triage vital signs BP 122/70   Pulse 88   Temp 97.8 F (36.6 C) (Oral)   Resp (!) 30   SpO2 92%  Physical Exam Vitals and nursing note reviewed.  Constitutional:      General: She is not in acute distress.    Appearance: Normal appearance.     Comments: frail  HENT:     Head: Normocephalic and atraumatic.     Right Ear: External ear  normal.     Left Ear: External ear normal.     Nose: Nose normal.     Mouth/Throat:     Mouth: Mucous membranes are moist.  Eyes:     General: No scleral icterus.       Right eye: No discharge.  Left eye: No discharge.  Cardiovascular:     Rate and Rhythm: Normal rate and regular rhythm.     Pulses: Normal pulses.     Heart sounds: Normal heart sounds.  Pulmonary:     Effort: Pulmonary effort is normal. Tachypnea present. No accessory muscle usage or respiratory distress.     Breath sounds: Examination of the right-upper field reveals decreased breath sounds. Examination of the right-middle field reveals decreased breath sounds. Examination of the right-lower field reveals decreased breath sounds. Decreased breath sounds present.  Abdominal:     General: Abdomen is flat.     Tenderness: There is no abdominal tenderness. There is no guarding or rebound.  Musculoskeletal:        General: Normal range of motion.     Cervical back: Normal range of motion.     Right lower leg: No edema.     Left lower leg: No edema.  Skin:    General: Skin is warm and dry.     Capillary Refill: Capillary refill takes less than 2 seconds.  Neurological:     Mental Status: She is alert and oriented to person, place, and time.     GCS: GCS eye subscore is 4. GCS verbal subscore is 5. GCS motor subscore is 6.  Psychiatric:        Mood and Affect: Mood normal.        Behavior: Behavior normal.     ED Results and Treatments Labs (all labs ordered are listed, but only abnormal results are displayed) Labs Reviewed  COMPREHENSIVE METABOLIC PANEL - Abnormal; Notable for the following components:      Result Value   Sodium 130 (*)    Chloride 95 (*)    Glucose, Bld 110 (*)    Calcium 8.5 (*)    Total Protein 6.1 (*)    Albumin 2.2 (*)    All other components within normal limits  BRAIN NATRIURETIC PEPTIDE - Abnormal; Notable for the following components:   B Natriuretic Peptide 120.5 (*)     All other components within normal limits  CBC WITH DIFFERENTIAL/PLATELET - Abnormal; Notable for the following components:   RBC 3.41 (*)    Hemoglobin 10.6 (*)    HCT 32.0 (*)    Platelets 632 (*)    Lymphs Abs 0.4 (*)    Monocytes Absolute 1.8 (*)    Abs Immature Granulocytes 0.12 (*)    All other components within normal limits  RESP PANEL BY RT-PCR (RSV, FLU A&B, COVID)  RVPGX2  PROTIME-INR  CBC WITH DIFFERENTIAL/PLATELET  HEPARIN LEVEL (UNFRACTIONATED)  PROTIME-INR  CBC                                                                                                                          Radiology [place] CHEST (PLEURAL EFFUSION)  Result Date: 09/02/2022 CLINICAL DATA:  88 year old female with recent diagnosis lung cancer recurrent right pleural effusion. The patient presents to the emergency department  shortness of breath. EXAM: CHEST ULTRASOUND COMPARISON:  08/09/2022, 08/15/2022, 08/24/2022, 08/30/2022 FINDINGS: Highly complex large right pleural effusion. Multiple echo it septations are present. Debris is visualized within multiple larger fluid pockets. IMPRESSION: Complex, septated right pleural effusion. No thoracentesis was performed at this time due to increased degree of complexity. Recommend attention on forthcoming chest CT. Ruthann Cancer, MD [facility] Radiology Electronically Signed   By: Ruthann Cancer M.D.   On: 09/02/2022 16:07   CT Chest W Contrast  Result Date: 09/02/2022 CLINICAL DATA:  Pleural effusion shortness of breath EXAM: CT CHEST WITH CONTRAST TECHNIQUE: Multidetector CT imaging of the chest was performed during intravenous contrast administration. RADIATION DOSE REDUCTION: This exam was performed according to the departmental dose-optimization program which includes automated exposure control, adjustment of the mA and/or kV according to patient size and/or use of iterative reconstruction technique. CONTRAST:   54mL OMNIPAQUE IOHEXOL 300 MG/ML  SOLN COMPARISON:  Chest x-ray 09/02/2022, PET CT 07/29/2022, chest CT 07/12/2022 FINDINGS: Cardiovascular: Moderate aortic atherosclerosis. No aneurysm. Small volume irregular free-floating mural thrombus at the aortic arch, series 2, image 34. Normal cardiac size. No pericardial effusion. Mediastinum/Nodes: Midline trachea. No thyroid mass. Mild mediastinal and right hilar nodes. Right hilar node measures 14 mm, series 2, image 67. Fluid distension of the esophagus. Lungs/Pleura: Circumferential right pleural thickening raising concern for pleural metastatic disease. Moderate loculated right pleural effusion with interval gas bubbles. Consolidation/mass at the right middle and lower lobes with additional ground-glass density in the right upper and middle lobes which could be due to superimposed infection. Emphysema. Upper Abdomen: Redemonstrated incompletely visualized fluid collection posterior to the right hepatic lobe, this measures 3.9 cm compared with 3.9 cm previously. Musculoskeletal: No acute osseous abnormality. Mild rim enhancing fluid collection within the right posterior chest wall, measuring approximately 6.2 x 1 cm, series 2, image 98, question communication with pleural space at the right 9-10 intercostal space. IMPRESSION: 1. Circumferential right pleural thickening raising concern for pleural metastatic disease. Moderate loculated right pleural effusion with interval gas bubbles, suspect for empyema. Consolidation/mass at the right middle and lower lobes slightly progressive compared to the prior PET CT. Additional ground-glass densities in the right upper and middle lobes potentially due to acute infection. 2. Rim enhancing fluid collection within the right posterior chest wall measuring approximately 6.2 x 1 cm, question communication with pleural space at the right 9-10 intercostal space. 3. Small volume irregular free-floating mural thrombus at the aortic arch,  increased risk for thromboembolic phenomenon. 4. Emphysema. 5. Redemonstrated incompletely visualized fluid collection posterior to the right hepatic lobe measuring 3.9 cm. 6. Aortic atherosclerosis. Aortic Atherosclerosis (ICD10-I70.0) and Emphysema (ICD10-J43.9). Electronically Signed   By: Donavan Foil M.D.   On: 09/02/2022 15:57   DG Chest 2 View  Result Date: 09/02/2022 CLINICAL DATA:  Shortness of breath. EXAM: CHEST - 2 VIEW COMPARISON:  August 30, 2022. FINDINGS: The heart size and mediastinal contours are within normal limits. Left lung is unremarkable. There appears to be a large loculated hydropneumothorax or possible empyema seen posteriorly in right hemithorax. Right basilar atelectasis or infiltrate is noted. The visualized skeletal structures are unremarkable. IMPRESSION: Probable large loculated right-sided hydropneumothorax or possible empyema is noted posteriorly. CT scan with intravenous contrast is recommended for further evaluation. Electronically Signed   By: Marijo Conception M.D.   On: 09/02/2022 13:05    Pertinent labs & imaging results that were available during my care of the patient were reviewed by  me and considered in my medical decision making (see MDM for details).  Medications Ordered in ED Medications  ceFEPIme (MAXIPIME) 2 g in sodium chloride 0.9 % 100 mL IVPB (has no administration in time range)  vancomycin (VANCOREADY) IVPB 1250 mg/250 mL (has no administration in time range)  iohexol (OMNIPAQUE) 300 MG/ML solution 75 mL (75 mLs Intravenous Contrast Given 09/02/22 1512)  vancomycin (VANCOCIN) IVPB 1000 mg/200 mL premix (1,000 mg Intravenous New Bag/Given 09/02/22 1714)  piperacillin-tazobactam (ZOSYN) IVPB 3.375 g (0 g Intravenous Stopped 09/02/22 1732)                                                                                                                                     Procedures Procedures  (including critical care time)  Medical Decision  Making / ED Course   MDM:  Gail Jennings is a 88 y.o. female with past medical history as above, significant for stage IV right lung adenocarcinoma (primary), as above which further complicates the presenting complaint. Pt presents to the ED with complaint of dib. The complaint involves an extensive differential diagnosis and also carries with it a high risk of complications and morbidity.  Serious etiology was considered. Ddx includes but is not limited to: In my evaluation of this patient's dyspnea my DDx includes, but is not limited to, pneumonia, pulmonary embolism, pneumothorax, pulmonary edema, metabolic acidosis, asthma, COPD, cardiac cause, anemia, anxiety, etc.   On initial assessment the patient is: she has tachypnea, no hypoxia, resting comfortably on stretcher Vital signs and nursing notes were reviewed  Pt with recent thoracentesis, she has them performed frequently. Will order XR and screening labs.  XR reviewed, on wet read appears to be sig pleural effusion to the right. Radiology recommended Ct chest w/ contrast to better delineate  >> Ct was reviewed, concern for possible empyema, loculated effusion. Likely 2/2 her malignancy to that right lung  Labs reviewed and were stable, similar to her prior. RVP neg.   Concern for empyema, d/w IR Dr Serafina Royals, was not able to complete thora 2/2 complexity of effusion. I d/w CTS Dr Kipp Brood who said pt likely not a surgical candidate given advanced malignancy. Spoke again with Dr Serafina Royals IR, will re-attempt thora tomorrow but pt will likely need a thoracostomy. At this time will plan to cover with abx for possible empyema, admit to [facility] to facilitate CTS eval for thoracostomy in event that IR is unable to complete thoracentesis in the AM. Admitted to hospitalist service. Lengthy discussion with pt/friend at bedside in regards to plan and grave nature of illness     Additional history obtained: -Additional history obtained from  caregiver -External records from outside source obtained and reviewed including: Chart review including previous notes, labs, imaging, consultation notes including prior ed visits, primary care visits, prior labs/imaging , home meds    Lab Tests: -I ordered, reviewed, and interpreted labs.   The pertinent  results include:   Labs Reviewed  COMPREHENSIVE METABOLIC PANEL - Abnormal; Notable for the following components:      Result Value   Sodium 130 (*)    Chloride 95 (*)    Glucose, Bld 110 (*)    Calcium 8.5 (*)    Total Protein 6.1 (*)    Albumin 2.2 (*)    All other components within normal limits  BRAIN NATRIURETIC PEPTIDE - Abnormal; Notable for the following components:   B Natriuretic Peptide 120.5 (*)    All other components within normal limits  CBC WITH DIFFERENTIAL/PLATELET - Abnormal; Notable for the following components:   RBC 3.41 (*)    Hemoglobin 10.6 (*)    HCT 32.0 (*)    Platelets 632 (*)    Lymphs Abs 0.4 (*)    Monocytes Absolute 1.8 (*)    Abs Immature Granulocytes 0.12 (*)    All other components within normal limits  RESP PANEL BY RT-PCR (RSV, FLU A&B, COVID)  RVPGX2  PROTIME-INR  CBC WITH DIFFERENTIAL/PLATELET  HEPARIN LEVEL (UNFRACTIONATED)  PROTIME-INR  CBC    Notable for chronic hyponatremia, hgb gradual decline from baseline  EKG   EKG Interpretation  Date/Time:  Friday September 02 2022 12:15:20 EST Ventricular Rate:  90 PR Interval:  58 QRS Duration: 94 QT Interval:  368 QTC Calculation: 451 R Axis:   81 Text Interpretation: Sinus rhythm Short PR interval Anteroseptal infarct, age indeterminate Baseline wander in lead(s) I II aVR Interpretation limited secondary to artifact Confirmed by Wynona Dove ([id number]) on 09/02/2022 12:49:46 PM         Imaging Studies ordered: I ordered imaging studies including CXR CT CHEST On my interpretation imaging demonstrates large r pleural effusion, ?empyema, ?mural thrombus I independently visualized  and interpreted imaging. I agree with the radiologist interpretation   Medicines ordered and prescription drug management: Meds ordered this encounter  Medications   iohexol (OMNIPAQUE) 300 MG/ML solution 75 mL   vancomycin (VANCOCIN) IVPB 1000 mg/200 mL premix    Order Specific Question:   Indication:    Answer:   Sepsis   piperacillin-tazobactam (ZOSYN) IVPB 3.375 g    Order Specific Question:   Antibiotic Indication:    Answer:   Sepsis   ceFEPIme (MAXIPIME) 2 g in sodium chloride 0.9 % 100 mL IVPB    Order Specific Question:   Antibiotic Indication:    Answer:   HCAP   vancomycin (VANCOREADY) IVPB 1250 mg/250 mL    Order Specific Question:   Indication:    Answer:   Pneumonia    -I have reviewed the patients home medicines and have made adjustments as needed   Consultations Obtained: I requested consultation with the CTS and IR,  and discussed lab and imaging findings as well as pertinent plan - they recommend: as above, will admit for trial thora in the AM but will likely need thoracostomy by CTS   Cardiac Monitoring: The patient was maintained on a cardiac monitor.  I personally viewed and interpreted the cardiac monitored which showed an underlying rhythm of: NSR  Social Determinants of Health:  Diagnosis or treatment significantly limited by social determinants of health: former smoker   Reevaluation: After the interventions noted above, I reevaluated the patient and found that they have stayed the same  Co morbidities that complicate the patient evaluation  Past Medical History:  Diagnosis Date   Atrial fibrillation ([place])    Complication of anesthesia    cannot take anectine  Hypertension    Inguinal hernia       Dispostion: Disposition decision including need for hospitalization was considered, and patient admitted to the hospital.    Final Clinical Impression(s) / ED Diagnoses Final diagnoses:  Pleural effusion  Adenocarcinoma of right lung ([place])   Empyema ([place])     This chart was dictated using voice recognition software.  Despite best efforts to proofread,  errors can occur which can change the documentation meaning.    Jeanell Sparrow, DO 09/02/22 1824

## 2022-09-02 NOTE — Telephone Encounter (Signed)
This nurse returned call to patients health advocate, [name], related to a message that she left for this nurse and at symptom management stating that patient is having difficulty breathing and may possibly need a thoracentesis.  This nurse advised that if patient is having difficulty breathing please take her to the ED for evaluation.  Health advocate states that patient is finally in agreement with going.  She stated that she would carry patient for evaluation and treatment.  No further questions or concerns noted at this time.

## 2022-09-02 NOTE — H&P (Addendum)
**Note Gail-Identified via Obfuscation** History and Physical    DESTRY BEZDEK DDU:[id number] DOB: 1933-07-02 DOA: 09/02/2022  Referring MD/NP/PA: EDP PCP:  Patient coming from: Home  Chief Complaint: Shortness of breath  HPI: Gail Jennings is a 88/F with history of hypertension, paroxysmal atrial fibrillation on Eliquis was diagnosed with stage IV adenocarcinoma right lung in November 23, malignant pleural effusions has required 4-5 thoracentesis in the last 6 weeks, during her last thoracentesis 3 days ago-she had decreasing output with only 600 mL drained as opposed to 1-1.5 L on prior occasions.  Last few days she had increase shortness of breath, cough, presented to the ED today for worsening symptoms. ED course: vital signs stable, without hypoxia, labs notable for sodium of 130, albumin of 2.2, hemoglobin 10.6 CT chest: Notable for right pleural wall thickening concerning for pleural metastatic disease, loculated right pleural effusion with interval gas bubbles concerning for empyema, consolidation of the right middle and lower lobes.  In addition rim-enhancing fluid collection in the right posterior chest wall with possible communication with the pleural space and in addition was noted to have irregular free-floating mural thrombus of the aortic arch suggesting increased risk of thromboembolic phenomenon and emphysema  Review of Systems: As per HPI otherwise 14 point review of systems negative.   Past Medical History:  Diagnosis Date   Atrial fibrillation ([place])    Complication of anesthesia    cannot take anectine   Hypertension    Inguinal hernia     Past Surgical History:  Procedure Laterality Date   ABDOMINAL HYSTERECTOMY     DENTAL SURGERY  Jan 11, 2011   bone graft and extraction   EYE SURGERY     detached retina, cryo, lens implant   INGUINAL HERNIA REPAIR  09/27/2011   Procedure: HERNIA REPAIR INGUINAL INCARCERATED;  Surgeon: Pedro Earls, MD;  Location: [facility];  Service:  General;  Laterality: Left;  Left Ingunial hernia repair with mesh   INGUINAL HERNIA REPAIR Left 10/19/2012   Procedure: LAPAROSCOPIC possible open left femoral hernia repair ;  Surgeon: Pedro Earls, MD;  Location: [facility];  Service: General;  Laterality: Left;  Laparoscopic Left Femoral Hernia Repair with Mesh   IR THORACENTESIS ASP PLEURAL SPACE W/IMG GUIDE  07/27/2022   IR THORACENTESIS ASP PLEURAL SPACE W/IMG GUIDE  08/02/2022   IR THORACENTESIS ASP PLEURAL SPACE W/IMG GUIDE  08/09/2022   IR THORACENTESIS ASP PLEURAL SPACE W/IMG GUIDE  08/24/2022   IR THORACENTESIS ASP PLEURAL SPACE W/IMG GUIDE  08/30/2022   MOUTH SURGERY     lt upper bone graft for implants   TONSILLECTOMY     TUBAL LIGATION       reports that she quit smoking about 51 years ago. Her smoking use included cigarettes. She has a 10.00 pack-year smoking history. She has never used smokeless tobacco. She reports current alcohol use of about 2.0 standard drinks of alcohol per week. She reports that she does not use drugs.  Allergies  Allergen Reactions   Codeine Nausea And Vomiting   Hydrocodone Nausea And Vomiting   Succinylcholine Chloride     Prolonged paralysis Brand name Anectine    Family History  Problem Relation Age of Onset   Parkinson's disease Mother    Healthy Sister      Prior to Admission medications   Medication Sig Start Date End Date Taking? Authorizing Provider  apixaban (ELIQUIS) 2.5 MG TABS tablet Take 1 tablet (2.5 mg total) by mouth 2 (two) times daily.  07/17/22   [name], MD  folic acid (FOLVITE) 1 MG tablet Take 1 tablet (1 mg total) by mouth daily. 08/10/22   Heilingoetter, Cassandra L, PA-C  metoprolol tartrate (LOPRESSOR) 25 MG tablet Take 0.5 tablets (12.5 mg total) by mouth 2 (two) times daily. 07/17/22   [name], MD  ondansetron (ZOFRAN) 8 MG tablet Take 1 tablet (8 mg total) by mouth every 8 (eight) hours as needed for nausea or vomiting. Start on day 3 after  carboplatin. Patient not taking: Reported on 08/23/2022 08/16/22   Curt Bears, MD  prochlorperazine (COMPAZINE) 10 MG tablet Take 1 tablet (10 mg total) by mouth every 6 (six) hours as needed for nausea or vomiting. 08/16/22   Curt Bears, MD    Physical Exam: Vitals:   09/02/22 1650 09/02/22 1652 09/02/22 1700 09/02/22 1730  BP:   109/82 122/70  Pulse: 87  92 88  Resp: (!) 31  (!) 24 (!) 30  Temp:  97.8 F (36.6 C)    TempSrc:  Oral    SpO2: 96%  93% 92%      Constitutional: Chronically ill cachectic elderly female sitting up in bed, AAOx3, no distress CVS: S1-S2, regular rhythm Lungs: Decreased breath sounds midway down the right lung Abdomen: Soft, nontender, bowel sounds present Extremities: 1-2+ edema Skin: Dry scaly skin both lower extremities Neuro: Moves all extremities, no localizing signs Psych: Normal insight and judgment    Labs on Admission: I have personally reviewed following labs and imaging studies  CBC: Recent Labs  Lab 09/02/22 1400  WBC 7.4  NEUTROABS 5.1  HGB 10.6*  HCT 32.0*  MCV 93.8  PLT 025*   Basic Metabolic Panel: Recent Labs  Lab 09/02/22 1319  NA 130*  K 4.7  CL 95*  CO2 24  GLUCOSE 110*  BUN 16  CREATININE 0.58  CALCIUM 8.5*   GFR: Estimated Creatinine Clearance: 37.4 mL/min (by C-G formula based on SCr of 0.58 mg/dL). Liver Function Tests: Recent Labs  Lab 09/02/22 1319  AST 20  ALT 13  ALKPHOS 75  BILITOT 1.0  PROT 6.1*  ALBUMIN 2.2*   No results for input(s): "LIPASE", "AMYLASE" in the last 168 hours. No results for input(s): "AMMONIA" in the last 168 hours. Coagulation Profile: Recent Labs  Lab 09/02/22 1319  INR 1.0   Cardiac Enzymes: No results for input(s): "CKTOTAL", "CKMB", "CKMBINDEX", "TROPONINI" in the last 168 hours. BNP (last 3 results) No results for input(s): "PROBNP" in the last 8760 hours. HbA1C: No results for input(s): "HGBA1C" in the last 72 hours. CBG: No results for  input(s): "GLUCAP" in the last 168 hours. Lipid Profile: No results for input(s): "CHOL", "HDL", "LDLCALC", "TRIG", "CHOLHDL", "LDLDIRECT" in the last 72 hours. Thyroid Function Tests: No results for input(s): "TSH", "T4TOTAL", "FREET4", "T3FREE", "THYROIDAB" in the last 72 hours. Anemia Panel: No results for input(s): "VITAMINB12", "FOLATE", "FERRITIN", "TIBC", "IRON", "RETICCTPCT" in the last 72 hours. Urine analysis: No results found for: "COLORURINE", "APPEARANCEUR", "LABSPEC", "PHURINE", "GLUCOSEU", "HGBUR", "BILIRUBINUR", "KETONESUR", "PROTEINUR", "UROBILINOGEN", "NITRITE", "LEUKOCYTESUR" Sepsis Labs: @LABRCNTIP (procalcitonin:4,lacticidven:4) ) Recent Results (from the past 240 hour(s))  Resp panel by RT-PCR (RSV, Flu A&B, Covid) Anterior Nasal Swab     Status: None   Collection Time: 09/02/22  1:22 PM   Specimen: Anterior Nasal Swab  Result Value Ref Range Status   SARS Coronavirus 2 by RT PCR NEGATIVE NEGATIVE Final    Comment: (NOTE) SARS-CoV-2 target nucleic acids are NOT DETECTED.  The SARS-CoV-2 RNA is  generally detectable in upper respiratory specimens during the acute phase of infection. The lowest concentration of SARS-CoV-2 viral copies this assay can detect is 138 copies/mL. A negative result does not preclude SARS-Cov-2 infection and should not be used as the sole basis for treatment or other patient management decisions. A negative result may occur with  improper specimen collection/handling, submission of specimen other than nasopharyngeal swab, presence of viral mutation(s) within the areas targeted by this assay, and inadequate number of viral copies(<138 copies/mL). A negative result must be combined with clinical observations, patient history, and epidemiological information. The expected result is Negative.  Fact Sheet for Patients:  EntrepreneurPulse.com.au  Fact Sheet for Healthcare Providers:   IncredibleEmployment.be  This test is no t yet approved or cleared by the [place] FDA and  has been authorized for detection and/or diagnosis of SARS-CoV-2 by FDA under an Emergency Use Authorization (EUA). This EUA will remain  in effect (meaning this test can be used) for the duration of the COVID-19 declaration under Section 564(b)(1) of the Act, 21 U.S.C.section 360bbb-3(b)(1), unless the authorization is terminated  or revoked sooner.       Influenza A by PCR NEGATIVE NEGATIVE Final   Influenza B by PCR NEGATIVE NEGATIVE Final    Comment: (NOTE) The Xpert Xpress SARS-CoV-2/FLU/RSV plus assay is intended as an aid in the diagnosis of influenza from Nasopharyngeal swab specimens and should not be used as a sole basis for treatment. Nasal washings and aspirates are unacceptable for Xpert Xpress SARS-CoV-2/FLU/RSV testing.  Fact Sheet for Patients: EntrepreneurPulse.com.au  Fact Sheet for Healthcare Providers: IncredibleEmployment.be  This test is not yet approved or cleared by the [place] FDA and has been authorized for detection and/or diagnosis of SARS-CoV-2 by FDA under an Emergency Use Authorization (EUA). This EUA will remain in effect (meaning this test can be used) for the duration of the COVID-19 declaration under Section 564(b)(1) of the Act, 21 U.S.C. section 360bbb-3(b)(1), unless the authorization is terminated or revoked.     Resp Syncytial Virus by PCR NEGATIVE NEGATIVE Final    Comment: (NOTE) Fact Sheet for Patients: EntrepreneurPulse.com.au  Fact Sheet for Healthcare Providers: IncredibleEmployment.be  This test is not yet approved or cleared by the [place] FDA and has been authorized for detection and/or diagnosis of SARS-CoV-2 by FDA under an Emergency Use Authorization (EUA). This EUA will remain in effect (meaning this test can be used) for  the duration of the COVID-19 declaration under Section 564(b)(1) of the Act, 21 U.S.C. section 360bbb-3(b)(1), unless the authorization is terminated or revoked.  Performed at [facility], [place] [address], [place], [place] [address]      Radiological Exams on Admission: [place] CHEST (PLEURAL EFFUSION)  Result Date: 09/02/2022 CLINICAL DATA:  88 year old female with recent diagnosis lung cancer recurrent right pleural effusion. The patient presents to the emergency department shortness of breath. EXAM: CHEST ULTRASOUND COMPARISON:  08/09/2022, 08/15/2022, 08/24/2022, 08/30/2022 FINDINGS: Highly complex large right pleural effusion. Multiple echo it septations are present. Debris is visualized within multiple larger fluid pockets. IMPRESSION: Complex, septated right pleural effusion. No thoracentesis was performed at this time due to increased degree of complexity. Recommend attention on forthcoming chest CT. Ruthann Cancer, MD [facility] Radiology Electronically Signed   By: Ruthann Cancer M.D.   On: 09/02/2022 16:07   CT Chest W Contrast  Result Date: 09/02/2022 CLINICAL DATA:  Pleural effusion shortness of breath EXAM: CT CHEST WITH CONTRAST TECHNIQUE: Multidetector CT  imaging of the chest was performed during intravenous contrast administration. RADIATION DOSE REDUCTION: This exam was performed according to the departmental dose-optimization program which includes automated exposure control, adjustment of the mA and/or kV according to patient size and/or use of iterative reconstruction technique. CONTRAST:  42mL OMNIPAQUE IOHEXOL 300 MG/ML  SOLN COMPARISON:  Chest x-ray 09/02/2022, PET CT 07/29/2022, chest CT 07/12/2022 FINDINGS: Cardiovascular: Moderate aortic atherosclerosis. No aneurysm. Small volume irregular free-floating mural thrombus at the aortic arch, series 2, image 34. Normal cardiac size. No pericardial effusion.  Mediastinum/Nodes: Midline trachea. No thyroid mass. Mild mediastinal and right hilar nodes. Right hilar node measures 14 mm, series 2, image 67. Fluid distension of the esophagus. Lungs/Pleura: Circumferential right pleural thickening raising concern for pleural metastatic disease. Moderate loculated right pleural effusion with interval gas bubbles. Consolidation/mass at the right middle and lower lobes with additional ground-glass density in the right upper and middle lobes which could be due to superimposed infection. Emphysema. Upper Abdomen: Redemonstrated incompletely visualized fluid collection posterior to the right hepatic lobe, this measures 3.9 cm compared with 3.9 cm previously. Musculoskeletal: No acute osseous abnormality. Mild rim enhancing fluid collection within the right posterior chest wall, measuring approximately 6.2 x 1 cm, series 2, image 98, question communication with pleural space at the right 9-10 intercostal space. IMPRESSION: 1. Circumferential right pleural thickening raising concern for pleural metastatic disease. Moderate loculated right pleural effusion with interval gas bubbles, suspect for empyema. Consolidation/mass at the right middle and lower lobes slightly progressive compared to the prior PET CT. Additional ground-glass densities in the right upper and middle lobes potentially due to acute infection. 2. Rim enhancing fluid collection within the right posterior chest wall measuring approximately 6.2 x 1 cm, question communication with pleural space at the right 9-10 intercostal space. 3. Small volume irregular free-floating mural thrombus at the aortic arch, increased risk for thromboembolic phenomenon. 4. Emphysema. 5. Redemonstrated incompletely visualized fluid collection posterior to the right hepatic lobe measuring 3.9 cm. 6. Aortic atherosclerosis. Aortic Atherosclerosis (ICD10-I70.0) and Emphysema (ICD10-J43.9). Electronically Signed   By: Donavan Foil M.D.   On:  09/02/2022 15:57   DG Chest 2 View  Result Date: 09/02/2022 CLINICAL DATA:  Shortness of breath. EXAM: CHEST - 2 VIEW COMPARISON:  August 30, 2022. FINDINGS: The heart size and mediastinal contours are within normal limits. Left lung is unremarkable. There appears to be a large loculated hydropneumothorax or possible empyema seen posteriorly in right hemithorax. Right basilar atelectasis or infiltrate is noted. The visualized skeletal structures are unremarkable. IMPRESSION: Probable large loculated right-sided hydropneumothorax or possible empyema is noted posteriorly. CT scan with intravenous contrast is recommended for further evaluation. Electronically Signed   By: Marijo Conception M.D.   On: 09/02/2022 13:05     Assessment/Plan  Loculated pleural effusion -With interval gas bubbles concerning for empyema, rim-enhancing fluid collection in the posterior chest wall,?  Fistula -Start IV vancomycin and cefepime -Do not see any benefit for repeat thoracentesis at this time -Anticipate need for 1 or more chest tubes, may need VATS/decortication, if she does not respond to chest tubes alone, she is not a great candidate for VATS/decortication at this time -TCTS Dr.Lightfoot consulted -Transfer to [facility]  Metastatic adenocarcinoma right lung Malignant pleural effusion -Followed by Dr. Earlie Server, currently on low-dose chemotherapy -Hold treatment in the setting of above infection -I think her overall prognosis is not very good, would benefit from a goals of care discussion at some point -Discussed CODE STATUS,  she is agreeable to DNR -Will add Dr. Earlie Server to treatment team  Aortic arch mural thrombus -Noted on CTchest -Increasing risk of arterial embolization -Hold apixaban, start IV heparin  Paroxysmal atrial fibrillation -In sinus rhythm at this time, continue metoprolol, hold apixaban, IV heparin as above  Essential hypertension -BP stable, continue  metoprolol  Severe protein calorie malnutrition -In the setting of metastatic cancer -Add supplements as tolerated   DVT prophylaxis: SCDs Code Status: DNR Family Communication: Discussed with patient and her friend/healthcare power of attorney [name] is now Disposition Plan: Transfer to [employer] called: TCTS Admission status: Inpatient  Domenic Polite MD [facility]   09/02/2022, 5:47 PM

## 2022-09-02 NOTE — ED Notes (Signed)
[facility] called and given report to have pt transported to [facility].

## 2022-09-02 NOTE — ED Provider Triage Note (Cosign Needed)
Emergency Medicine Provider Triage Evaluation Note  Gail Jennings , a 88 y.o. female  was evaluated in triage.  Pt complains of weakness, decreased appetite, short of breath.  Pt sent here by Dr. Earlie Server.  Pt had thoracentesis on Tuesday.  Pt reports she normally feels better afterwards but worse this time.  Review of Systems  Positive: weakness Negative: fever  Physical Exam  BP 123/78 (BP Location: Left Arm)   Pulse 89   Temp 98.3 F (36.8 C) (Oral)   Resp (!) 25   SpO2 100%  Gen:   Awake, no distress   Resp:  Normal effort  MSK:   Moves extremities without difficulty  Other:    Medical Decision Making  Medically screening exam initiated at 12:36 PM.  Appropriate orders placed.  Gail Jennings was informed that the remainder of the evaluation will be completed by another provider, this initial triage assessment does not replace that evaluation, and the importance of remaining in the ED until their evaluation is complete.     Fransico Meadow, [place] 09/02/22 1237

## 2022-09-02 NOTE — ED Triage Notes (Signed)
Pt arrived via POV, c/o SOB, worsening since last thoracentesis.

## 2022-09-02 NOTE — ED Notes (Signed)
Pt is off the floor in MRI will get vitals on return

## 2022-09-02 NOTE — Progress Notes (Signed)
ANTICOAGULATION CONSULT NOTE - Initial Consult  Pharmacy Consult for Heparin Indication: mural thrombus at the aorticarch  Allergies  Allergen Reactions   Succinylcholine Chloride Other (See Comments)    Prolonged paralysis Brand name Anectine   Codeine Nausea And Vomiting   Hydrocodone Nausea And Vomiting    Patient Measurements:   Heparin Dosing Weight: 47.8 kg  Vital Signs: Temp: 97.8 F (36.6 C) (12/29 1652) Temp Source: Oral (12/29 1652) BP: 122/70 (12/29 1730) Pulse Rate: 88 (12/29 1730)  Labs: Recent Labs    09/02/22 1319 09/02/22 1400  HGB  --  10.6*  HCT  --  32.0*  PLT  --  632*  LABPROT 13.1  --   INR 1.0  --   CREATININE 0.58  --     Estimated Creatinine Clearance: 37.4 mL/min (by C-G formula based on SCr of 0.58 mg/dL).   Medical History: Past Medical History:  Diagnosis Date   Atrial fibrillation ([place])    Complication of anesthesia    cannot take anectine   Hypertension    Inguinal hernia     Assessment: Active Problem(s): Difficulty breathing (previous thoracentesis 12/26)  PMH: hypertension, paroxysmal atrial fibrillation on Eliquis was diagnosed with stage IV adenocarcinoma right lung in November 23, malignant pleural effusions has required 4-5 thoracentesis in the last 6 weeks, inguinal hernia  Significant events:  12/26 Thoracentesis 650ml  AC/Heme: 12/29 CT: Small volume irregular free-floating mural thrombus at the aortic arch, increased risk for thromboembolic phenomenon. Start IV heparin. - h/o afib but no longer on Eliquis.  Goal of Therapy:  Heparin level 0.3-0.7 units/ml Monitor platelets by anticoagulation protocol: Yes   Plan:  Heparin bolus 3500 units Heparin infusion at 800 units/hr Check heparin level in 8 hrs Daily HL and CBC   Meia Emley S. Alford Highland, PharmD, BCPS Clinical Staff Pharmacist [email or web address] Alford Highland, [employer] 09/02/2022,7:15 PM

## 2022-09-02 NOTE — Progress Notes (Signed)
Pharmacy Antibiotic Note  Gail Jennings is a 88 y.o. female admitted on 09/02/2022 with pneumonia.  Pharmacy has been consulted for Vanco, Cefepime dosing.  Active Problem(s): Difficulty breathing (previous thoracentesis 12/26)  ID: HCAP.  - Afebrile, WBC 7.4. Scr <1. Vanco 12/29>> Cefepime 12/29>> Zosyn 12/29 x 1  Plan: Cefepime 2g IV q 12hr Vanco 1g in ED then Vancomycin 1250 mg IV Q 48 hrs. Goal AUC 400-550. Expected AUC: 512, SCr used: 0.8    Temp (24hrs), Avg:98.1 F (36.7 C), Min:97.8 F (36.6 C), Max:98.3 F (36.8 C)  Recent Labs  Lab 09/02/22 1319 09/02/22 1400  WBC  --  7.4  CREATININE 0.58  --     Estimated Creatinine Clearance: 37.4 mL/min (by C-G formula based on SCr of 0.58 mg/dL).    Allergies  Allergen Reactions   Codeine Nausea And Vomiting   Hydrocodone Nausea And Vomiting   Succinylcholine Chloride     Prolonged paralysis Brand name Anectine    Gail Jennings Gail Jennings, PharmD, BCPS Clinical Staff Pharmacist [email or web address]  Gail Jennings 09/02/2022 5:57 PM

## 2022-09-02 NOTE — ED Notes (Signed)
Spoke to Freddi Starr has given report to oncoming nurse Cheri.  Beverlyn Roux, RN

## 2022-09-02 NOTE — Telephone Encounter (Signed)
This RN spoke with [name] regarding Ms. Ohalloran possibly needing a thoracentesis sooner than 09/06/22. Per Gerlene Burdock has been having a difficult time breathing and she is unsure if the patient can wait until Tuesday. [name] was advised that if patient is in respiratory distress she needs to go to the ER, [name] verbalized understanding and stated she will contact [facility] RN on her findings.

## 2022-09-03 ENCOUNTER — Inpatient Hospital Stay (HOSPITAL_COMMUNITY): Payer: Medicare Other

## 2022-09-03 ENCOUNTER — Other Ambulatory Visit: Payer: Self-pay

## 2022-09-03 ENCOUNTER — Encounter (HOSPITAL_COMMUNITY): Payer: Self-pay | Admitting: Internal Medicine

## 2022-09-03 DIAGNOSIS — I10 Essential (primary) hypertension: Secondary | ICD-10-CM

## 2022-09-03 DIAGNOSIS — C3491 Malignant neoplasm of unspecified part of right bronchus or lung: Secondary | ICD-10-CM

## 2022-09-03 DIAGNOSIS — J869 Pyothorax without fistula: Secondary | ICD-10-CM | POA: Diagnosis not present

## 2022-09-03 LAB — CBC
HCT: 27.2 % — ABNORMAL LOW (ref 36.0–46.0)
Hemoglobin: 9 g/dL — ABNORMAL LOW (ref 12.0–15.0)
MCH: 30.7 pg (ref 26.0–34.0)
MCHC: 33.1 g/dL (ref 30.0–36.0)
MCV: 92.8 fL (ref 80.0–100.0)
Platelets: 596 10*3/uL — ABNORMAL HIGH (ref 150–400)
RBC: 2.93 MIL/uL — ABNORMAL LOW (ref 3.87–5.11)
RDW: 13.1 % (ref 11.5–15.5)
WBC: 6.9 10*3/uL (ref 4.0–10.5)
nRBC: 0 % (ref 0.0–0.2)

## 2022-09-03 LAB — HEPARIN LEVEL (UNFRACTIONATED)
Heparin Unfractionated: <0.1 IU/mL — ABNORMAL LOW (ref 0.30–0.70)
Heparin Unfractionated: 0.15 IU/mL — ABNORMAL LOW (ref 0.30–0.70)
Heparin Unfractionated: 0.25 IU/mL — ABNORMAL LOW (ref 0.30–0.70)

## 2022-09-03 LAB — SURGICAL PCR SCREEN
MRSA, PCR: NEGATIVE
Staphylococcus aureus: NEGATIVE

## 2022-09-03 MED ORDER — HEPARIN BOLUS VIA INFUSION
750.0000 [IU] | Freq: Once | INTRAVENOUS | Status: AC
  Administered 2022-09-03: 750 [IU] via INTRAVENOUS
  Filled 2022-09-03: qty 750

## 2022-09-03 MED ORDER — ALBUTEROL SULFATE (2.5 MG/3ML) 0.083% IN NEBU
2.5000 mg | INHALATION_SOLUTION | RESPIRATORY_TRACT | Status: DC | PRN
  Administered 2022-09-03 – 2022-09-04 (×3): 2.5 mg via RESPIRATORY_TRACT
  Filled 2022-09-03 (×3): qty 3

## 2022-09-03 MED ORDER — FENTANYL CITRATE (PF) 100 MCG/2ML IJ SOLN
INTRAMUSCULAR | Status: AC
  Filled 2022-09-03: qty 2

## 2022-09-03 MED ORDER — PNEUMOCOCCAL 20-VAL CONJ VACC 0.5 ML IM SUSY
0.5000 mL | PREFILLED_SYRINGE | INTRAMUSCULAR | Status: DC
  Filled 2022-09-03: qty 0.5

## 2022-09-03 MED ORDER — MIDAZOLAM HCL 2 MG/2ML IJ SOLN
INTRAMUSCULAR | Status: AC
  Filled 2022-09-03: qty 2

## 2022-09-03 MED ORDER — SODIUM CHLORIDE 0.9 % IV SOLN: INTRAVENOUS | Status: DC

## 2022-09-03 MED ORDER — HEPARIN BOLUS VIA INFUSION
1500.0000 [IU] | Freq: Once | INTRAVENOUS | Status: AC
  Administered 2022-09-03: 1500 [IU] via INTRAVENOUS
  Filled 2022-09-03: qty 1500

## 2022-09-03 MED ORDER — BENZONATATE 100 MG PO CAPS
200.0000 mg | ORAL_CAPSULE | Freq: Three times a day (TID) | ORAL | Status: DC
  Administered 2022-09-03 – 2022-09-15 (×33): 200 mg via ORAL
  Filled 2022-09-03 (×34): qty 2

## 2022-09-03 NOTE — Progress Notes (Signed)
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  Allergies  Allergen Reactions   Succinylcholine Chloride Other (See Comments)    Prolonged paralysis Brand name Anectine   Codeine Nausea And Vomiting   Hydrocodone Nausea And Vomiting    Patient Measurements: Height: 5\' 4"  (162.6 cm) Weight: 48.7 kg (107 lb 5.8 oz) IBW/kg (Calculated) : 54.7 Heparin Dosing Weight: 48.7 kg  Vital Signs: Temp: 98.8 F (37.1 C) (12/30 0607) Temp Source: Oral (12/30 0607) BP: 119/77 (12/30 0607) Pulse Rate: 88 (12/30 0607)  Labs: Recent Labs    09/02/22 1319 09/02/22 1400 09/02/22 2121 09/03/22 0356  HGB  --  10.6*  --  9.0*  HCT  --  32.0*  --  27.2*  PLT  --  632*  --  596*  LABPROT 13.1  --  15.3*  --   INR 1.0  --  1.2  --   HEPARINUNFRC  --   --  0.44 <0.10*  CREATININE 0.58  --   --   --     Estimated Creatinine Clearance: 38.1 mL/min (by C-G formula based on SCr of 0.58 mg/dL).   Medications:  Scheduled:   folic acid  1 mg Oral Daily   metoprolol tartrate  12.5 mg Oral BID   [START ON 09/04/2022] pneumococcal 20-valent conjugate vaccine  0.5 mL Intramuscular Tomorrow-1000   Infusions:   ceFEPime (MAXIPIME) IV 2 g (09/03/22 0053)   heparin 900 Units/hr (09/03/22 0457)   [START ON 09/04/2022] vancomycin      Assessment: 42 yoF presenting with difficulty breathing, previous thoracentesis 12/26. PMH significant for HTN, paroxysmal Afib (no longer on Eliquis), diagnosed with stage IV adenocarcinoma of right lung in November 23, malignant pleural effusions has required 4-5 thoracentesis in the last 6 weeks, inguinal hernia. 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 Pharmacy consulted to start IV heparin.   Heparin level today is subtherapeutic at 0.15, on 900 units/hr. Hgb 9, plt 596. No line  issues or signs/symptoms of bleeding noted per RN.  Goal of Therapy:  Heparin level 0.3-0.7 units/ml Monitor platelets by anticoagulation protocol: Yes   Plan:  IV heparin 1500 units bolus x 1.  Increase IV heparin to 1000 units/hr. Check ~8 hr heparin level.  Daily CBC, heparin level. Monitor for signs/symptoms of bleeding.   Vance Peper, PharmD PGY-2 Pharmacy Resident Phone [phone] 09/03/2022 7:19 AM   Please check AMION for all [place] phone numbers After 10:00 PM, call [place] [phone]

## 2022-09-03 NOTE — Progress Notes (Addendum)
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  Allergies  Allergen Reactions   Succinylcholine Chloride Other (See Comments)    Prolonged paralysis Brand name Anectine   Codeine Nausea And Vomiting   Hydrocodone Nausea And Vomiting    Patient Measurements: Height: 5\' 4"  (162.6 cm) Weight: 48.7 kg (107 lb 5.8 oz) IBW/kg (Calculated) : 54.7 Heparin Dosing Weight: 48.7 kg  Vital Signs: Temp: 98.2 F (36.8 C) (12/30 2024) Temp Source: Oral (12/30 2024) BP: 150/81 (12/30 2024) Pulse Rate: 98 (12/30 2024)  Labs: Recent Labs    09/02/22 1319 09/02/22 1400 09/02/22 2121 09/02/22 2121 09/03/22 0356 09/03/22 1300 09/03/22 2154  HGB  --  10.6*  --   --  9.0*  --   --   HCT  --  32.0*  --   --  27.2*  --   --   PLT  --  632*  --   --  596*  --   --   LABPROT 13.1  --  15.3*  --   --   --   --   INR 1.0  --  1.2  --   --   --   --   HEPARINUNFRC  --   --  0.44   < > <0.10* 0.15* 0.25*  CREATININE 0.58  --   --   --   --   --   --    < > = values in this interval not displayed.     Estimated Creatinine Clearance: 38.1 mL/min (by C-G formula based on SCr of 0.58 mg/dL).   Medications:  Scheduled:   benzonatate  200 mg Oral TID   fentaNYL       folic acid  1 mg Oral Daily   metoprolol tartrate  12.5 mg Oral BID   midazolam       [START ON 09/04/2022] pneumococcal 20-valent conjugate vaccine  0.5 mL Intramuscular Tomorrow-1000   Infusions:   ceFEPime (MAXIPIME) IV 2 g (09/03/22 1303)   heparin 1,000 Units/hr (09/03/22 1635)   [START ON 09/04/2022] vancomycin      Assessment: Gail Jennings presenting with difficulty breathing, previous thoracentesis 12/26. PMH significant for HTN, paroxysmal Afib (no longer on Eliquis), diagnosed with stage IV adenocarcinoma of right lung in November 23, malignant pleural effusions has required 4-5 thoracentesis in the last 6 weeks, inguinal  hernia. 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 Pharmacy consulted to start IV heparin.   Heparin level remains subtherapeutic (0.25) on 1000 units/hr. No line issues or signs/symptoms of bleeding noted per RN.  RN states that heparin to be turned off 12/31 at 0500 for 0900 procedure.  Goal of Therapy:  Heparin level 0.3-0.7 units/ml Monitor platelets by anticoagulation protocol: Yes   Plan:  IV heparin 750 units bolus Increase IV heparin to 1100 units/hr - will add end time of 0500 per RN instructions Will f/u heparin restart post-procedure  Sherlon Handing, PharmD, BCPS Please see amion for complete clinical pharmacist phone list 09/03/2022 10:32 PM

## 2022-09-03 NOTE — Progress Notes (Signed)
Heard patient coughing so hard that cause her to have wheezing, MD made aware. IVF stopped. Patient saturating ok, no change of LOC. Will continue to monitor.

## 2022-09-03 NOTE — Consult Note (Signed)
Chief Complaint: Complex pleural effusion right.. Request is for right sided chest tube.  Supervising Physician: Ruthann Cancer  Patient Status: [facility] - In-pt  History of Present Illness: Gail Jennings is a 88 y.o. female inpatient.  History of atrial fibrillation (on Eliquis), hypertension, adenocarcinoma of the right lung with malignant pleural effusion.  Patient has had several thoracentesis.  The most recent being August 30, 2022 with 650 mL of amber-colored fluid removed.  Patient presented to the ED at [facility] on September 02, 2022 with increased shortness of breath.  Found to have a complex right-sided pleural effusion. CT chest from September 02, 2022 reads moderate loculated right pleural effusion with interval gas bubbles, suspect for empyema.  Team is requesting a chest tube for thrombolytics administration.  Patient alert and laying in bed,calm. Friend at bedside. Endorses SHOB. Denies any fevers, headache, chest pain, cough, abdominal pain, nausea, vomiting or bleeding.    Past Medical History:  Diagnosis Date   Atrial fibrillation ([place])    Complication of anesthesia    cannot take anectine   Hypertension    Inguinal hernia     Past Surgical History:  Procedure Laterality Date   ABDOMINAL HYSTERECTOMY     DENTAL SURGERY  Jan 11, 2011   bone graft and extraction   EYE SURGERY     detached retina, cryo, lens implant   INGUINAL HERNIA REPAIR  09/27/2011   Procedure: HERNIA REPAIR INGUINAL INCARCERATED;  Surgeon: Pedro Earls, MD;  Location: [place];  Service: General;  Laterality: Left;  Left Ingunial hernia repair with mesh   INGUINAL HERNIA REPAIR Left 10/19/2012   Procedure: LAPAROSCOPIC possible open left femoral hernia repair ;  Surgeon: Pedro Earls, MD;  Location: [facility];  Service: General;  Laterality: Left;  Laparoscopic Left Femoral Hernia Repair with Mesh   IR THORACENTESIS ASP PLEURAL SPACE W/IMG GUIDE  07/27/2022   IR  THORACENTESIS ASP PLEURAL SPACE W/IMG GUIDE  08/02/2022   IR THORACENTESIS ASP PLEURAL SPACE W/IMG GUIDE  08/09/2022   IR THORACENTESIS ASP PLEURAL SPACE W/IMG GUIDE  08/24/2022   IR THORACENTESIS ASP PLEURAL SPACE W/IMG GUIDE  08/30/2022   MOUTH SURGERY     lt upper bone graft for implants   TONSILLECTOMY     TUBAL LIGATION      Allergies: Succinylcholine chloride, Codeine, and Hydrocodone  Medications: Prior to Admission medications   Medication Sig Start Date End Date Taking? Authorizing Provider  folic acid (FOLVITE) 1 MG tablet Take 1 tablet (1 mg total) by mouth daily. 08/10/22  Yes Heilingoetter, Cassandra L, PA-C  metoprolol tartrate (LOPRESSOR) 25 MG tablet Take 0.5 tablets (12.5 mg total) by mouth 2 (two) times daily. 07/17/22  Yes [name], MD  ondansetron (ZOFRAN) 8 MG tablet Take 1 tablet (8 mg total) by mouth every 8 (eight) hours as needed for nausea or vomiting. Start on day 3 after carboplatin. 08/16/22  Yes Curt Bears, MD  prochlorperazine (COMPAZINE) 10 MG tablet Take 1 tablet (10 mg total) by mouth every 6 (six) hours as needed for nausea or vomiting. 08/16/22  Yes Curt Bears, MD  apixaban (ELIQUIS) 2.5 MG TABS tablet Take 1 tablet (2.5 mg total) by mouth 2 (two) times daily. Patient not taking: Reported on 09/02/2022 07/17/22   [name], MD     Family History  Problem Relation Age of Onset   Parkinson's disease Mother    Healthy Sister     Social History  Socioeconomic History   Marital status: Divorced    Spouse name: Not on file   Number of children: 1   Years of education: Not on file   Highest education level: Not on file  Occupational History   Not on file  Tobacco Use   Smoking status: Former    Packs/day: 0.50    Years: 20.00    Total pack years: 10.00    Types: Cigarettes    Quit date: 09/21/1970    Years since quitting: 51.9   Smokeless tobacco: Never  Substance and Sexual Activity   Alcohol use: Yes    Alcohol/week:  2.0 standard drinks of alcohol    Types: 2 Glasses of wine per week    Comment: occasional   Drug use: No   Sexual activity: Not on file  Other Topics Concern   Not on file  Social History Narrative   Not on file   Social Determinants of Health   Financial Resource Strain: Not on file  Food Insecurity: No Food Insecurity (09/03/2022)   Hunger Vital Sign    Worried About Running Out of Food in the Last Year: Never true    Ran Out of Food in the Last Year: Never true  Transportation Needs: No Transportation Needs (09/03/2022)   PRAPARE - Hydrologist (Medical): No    Lack of Transportation (Non-Medical): No  Physical Activity: Not on file  Stress: Not on file  Social Connections: Not on file     Review of Systems: A 12 point ROS discussed and pertinent positives are indicated in the HPI above.  All other systems are negative.  Review of Systems  Constitutional:  Negative for fatigue and fever.  HENT:  Negative for congestion.   Respiratory:  Negative for cough and shortness of breath.   Gastrointestinal:  Negative for abdominal pain, diarrhea, nausea and vomiting.    Vital Signs: BP 119/77 (BP Location: Right Arm)   Pulse 88   Temp 98.8 F (37.1 C) (Oral)   Resp (!) 21   Ht 5\' 4"  (1.626 m)   Wt 107 lb 5.8 oz (48.7 kg)   SpO2 96%   BMI 18.43 kg/m    Physical Exam Vitals and nursing note reviewed.  Constitutional:      Appearance: She is well-developed.  HENT:     Head: Normocephalic and atraumatic.  Eyes:     Conjunctiva/sclera: Conjunctivae normal.  Cardiovascular:     Rate and Rhythm: Normal rate and regular rhythm.  Pulmonary:     Effort: Pulmonary effort is normal.     Breath sounds: Decreased breath sounds present.     Comments: On O2 via [place] Musculoskeletal:        General: Normal range of motion.     Cervical back: Normal range of motion.  Skin:    General: Skin is warm and dry.  Neurological:     Mental Status: She  is alert and oriented to person, place, and time.  Psychiatric:        Mood and Affect: Mood normal.        Behavior: Behavior normal.     Imaging: [place] CHEST (PLEURAL EFFUSION)  Result Date: 09/02/2022 CLINICAL DATA:  88 year old female with recent diagnosis lung cancer recurrent right pleural effusion. The patient presents to the emergency department shortness of breath. EXAM: CHEST ULTRASOUND COMPARISON:  08/09/2022, 08/15/2022, 08/24/2022, 08/30/2022 FINDINGS: Highly complex large right pleural effusion. Multiple echo it septations are present. Debris is  visualized within multiple larger fluid pockets. IMPRESSION: Complex, septated right pleural effusion. No thoracentesis was performed at this time due to increased degree of complexity. Recommend attention on forthcoming chest CT. Ruthann Cancer, MD [facility] Electronically Signed   By: Ruthann Cancer M.D.   On: 09/02/2022 16:07   CT Chest W Contrast  Result Date: 09/02/2022 CLINICAL DATA:  Pleural effusion shortness of breath EXAM: CT CHEST WITH CONTRAST TECHNIQUE: Multidetector CT imaging of the chest was performed during intravenous contrast administration. RADIATION DOSE REDUCTION: This exam was performed according to the departmental dose-optimization program which includes automated exposure control, adjustment of the mA and/or kV according to patient size and/or use of iterative reconstruction technique. CONTRAST:  58mL OMNIPAQUE IOHEXOL 300 MG/ML  SOLN COMPARISON:  Chest x-ray 09/02/2022, PET CT 07/29/2022, chest CT 07/12/2022 FINDINGS: Cardiovascular: Moderate aortic atherosclerosis. No aneurysm. Small volume irregular free-floating mural thrombus at the aortic arch, series 2, image 34. Normal cardiac size. No pericardial effusion. Mediastinum/Nodes: Midline trachea. No thyroid mass. Mild mediastinal and right hilar nodes. Right hilar node measures 14 mm, series 2, image 67. Fluid  distension of the esophagus. Lungs/Pleura: Circumferential right pleural thickening raising concern for pleural metastatic disease. Moderate loculated right pleural effusion with interval gas bubbles. Consolidation/mass at the right middle and lower lobes with additional ground-glass density in the right upper and middle lobes which could be due to superimposed infection. Emphysema. Upper Abdomen: Redemonstrated incompletely visualized fluid collection posterior to the right hepatic lobe, this measures 3.9 cm compared with 3.9 cm previously. Musculoskeletal: No acute osseous abnormality. Mild rim enhancing fluid collection within the right posterior chest wall, measuring approximately 6.2 x 1 cm, series 2, image 98, question communication with pleural space at the right 9-10 intercostal space. IMPRESSION: 1. Circumferential right pleural thickening raising concern for pleural metastatic disease. Moderate loculated right pleural effusion with interval gas bubbles, suspect for empyema. Consolidation/mass at the right middle and lower lobes slightly progressive compared to the prior PET CT. Additional ground-glass densities in the right upper and middle lobes potentially due to acute infection. 2. Rim enhancing fluid collection within the right posterior chest wall measuring approximately 6.2 x 1 cm, question communication with pleural space at the right 9-10 intercostal space. 3. Small volume irregular free-floating mural thrombus at the aortic arch, increased risk for thromboembolic phenomenon. 4. Emphysema. 5. Redemonstrated incompletely visualized fluid collection posterior to the right hepatic lobe measuring 3.9 cm. 6. Aortic atherosclerosis. Aortic Atherosclerosis (ICD10-I70.0) and Emphysema (ICD10-J43.9). Electronically Signed   By: Donavan Foil M.D.   On: 09/02/2022 15:57   DG Chest 2 View  Result Date: 09/02/2022 CLINICAL DATA:  Shortness of breath. EXAM: CHEST - 2 VIEW COMPARISON:  August 30, 2022.  FINDINGS: The heart size and mediastinal contours are within normal limits. Left lung is unremarkable. There appears to be a large loculated hydropneumothorax or possible empyema seen posteriorly in right hemithorax. Right basilar atelectasis or infiltrate is noted. The visualized skeletal structures are unremarkable. IMPRESSION: Probable large loculated right-sided hydropneumothorax or possible empyema is noted posteriorly. CT scan with intravenous contrast is recommended for further evaluation. Electronically Signed   By: Marijo Conception M.D.   On: 09/02/2022 13:05   IR THORACENTESIS ASP PLEURAL SPACE W/IMG GUIDE  Result Date: 08/30/2022 INDICATION: History of lung cancer with recurrent right pleural effusion. Request for therapeutic right thoracentesis. EXAM: ULTRASOUND GUIDED THERAPEUTIC RIGHT THORACENTESIS MEDICATIONS: 10 mL 1 % lidocaine COMPLICATIONS: Possible small apical pneumothorax on postprocedure  radiograph; patient remained asymptomatic. SIR level A: No therapy, no consequence. PROCEDURE: An ultrasound guided thoracentesis was thoroughly discussed with the patient and questions answered. The benefits, risks, alternatives and complications were also discussed. The patient understands and wishes to proceed with the procedure. Written consent was obtained. Ultrasound was performed to localize and mark an adequate pocket of fluid in the right chest. The area was then prepped and draped in the normal sterile fashion. 1% Lidocaine was used for local anesthesia. Under ultrasound guidance a 6 Fr Safe-T-Centesis catheter was introduced. Thoracentesis was performed. The catheter was removed and a dressing applied. FINDINGS: A total of approximately 650 cc of hazy, amber fluid was removed. IMPRESSION: Successful ultrasound guided right thoracentesis yielding 650 cc of pleural fluid. Read by: Narda Rutherford, AGNP-BC Electronically Signed   By: Lucrezia Europe M.D.   On: 08/30/2022 14:57   DG Chest 1 View  Result  Date: 08/30/2022 CLINICAL DATA:  Status post right thoracentesis. EXAM: CHEST  1 VIEW COMPARISON:  August 24, 2022 chest x-ray FINDINGS: There is a tiny amount of air in the right pleural space at the apex, introduced at the time of thoracentesis. This is not unexpected. The right-sided pleural effusion is smaller. Remaining effusion and opacity on the right is otherwise stable. The cardiomediastinal silhouette is stable. The left lung is clear. Smaller IMPRESSION: 1. There is a tiny amount of air in the right pleural space at the apex, introduced at the time of thoracentesis. This is not unexpected. 2. The right-sided pleural effusion is smaller in the interval. 3. Remaining opacity and effusion on the right is stable. These results will be called to the ordering clinician or representative by the Radiologist Assistant, and communication documented in the PACS or [employer]. Electronically Signed   By: Dorise Bullion III M.D.   On: 08/30/2022 14:01   IR THORACENTESIS ASP PLEURAL SPACE W/IMG GUIDE  Result Date: 08/24/2022 INDICATION: Patient with history of lung cancer and recurrent right pleural effusions, request received for right thoracentesis. EXAM: ULTRASOUND GUIDED RIGHT THORACENTESIS MEDICATIONS: Local lidocaine 1% only. COMPLICATIONS: None immediate. PROCEDURE: An ultrasound guided thoracentesis was thoroughly discussed with the patient and questions answered. The benefits, risks, alternatives and complications were also discussed. The patient understands and wishes to proceed with the procedure. Written consent was obtained. Ultrasound was performed to localize and mark an adequate pocket of fluid in the right chest. The area was then prepped and draped in the normal sterile fashion. 1% Lidocaine was used for local anesthesia. Under ultrasound guidance a 19 gauge, 7-cm, Yueh catheter was introduced. Thoracentesis was performed. The catheter was removed and a dressing applied. FINDINGS: A  total of approximately 1.3 L of amber colored fluid was removed. IMPRESSION: Successful ultrasound guided right thoracentesis yielding 1.3 L of pleural fluid. This exam was performed by Tsosie Billing PA-C, and was supervised and interpreted by Dr. Denna Haggard. Electronically Signed   By: Albin Felling M.D.   On: 08/24/2022 12:43   DG Chest 1 View  Result Date: 08/24/2022 CLINICAL DATA:  Post thoracentesis in a female at age 69. Reportedly removal of 1.2 L from the RIGHT chest. EXAM: CHEST  1 VIEW COMPARISON:  August 15, 2022. FINDINGS: Cardiomediastinal contours and hilar structures are stable. Persistent RIGHT basilar airspace disease, airspace process appears potentially increased, pleural fluid the similar to slightly increased as well compared to December 11th. Lung mass not well evaluated. No sign of pneumothorax.  LEFT chest is clear. On limited assessment no acute  skeletal process. IMPRESSION: Increased airspace component in the RIGHT lung base, RIGHT lung mass not well evaluated. Little change in the pleural fluid in the RIGHT lung base since August 15, 2022 potentially slightly increased. No pneumothorax. Electronically Signed   By: Zetta Bills M.D.   On: 08/24/2022 11:17   US Thoracentesis Asp Pleural space w/IMG guide  Result Date: 08/15/2022 INDICATION: Primary lung adenocarcinoma, recurrent pleural effusion and dyspnea EXAM: ULTRASOUND GUIDED RIGHT THORACENTESIS MEDICATIONS: None. COMPLICATIONS: None immediate. PROCEDURE: An ultrasound guided thoracentesis was thoroughly discussed with the patient and questions answered. The benefits, risks, alternatives and complications were also discussed. The patient understands and wishes to proceed with the procedure. Written consent was obtained. Ultrasound was performed to localize and mark an adequate pocket of fluid in the right chest. The area was then prepped and draped in the normal sterile fashion. 1% Lidocaine was used for local anesthesia.  Under ultrasound guidance a 6 Fr Safe-T-Centesis catheter was introduced. Thoracentesis was performed. The catheter was removed and a dressing applied. FINDINGS: A total of approximately 1.3L of amber pleural fluid was removed. IMPRESSION: Successful ultrasound guided right thoracentesis yielding 1.3L of pleural fluid. Performed and dictated by Pasty Spillers, PA-C Electronically Signed   By: Markus Daft M.D.   On: 08/15/2022 12:14   MR Brain W Wo Contrast  Result Date: 08/15/2022 CLINICAL DATA:  88 year old female with a history of recurrent malignant right pleural effusion. Lung cancer. Staging. EXAM: MRI HEAD WITHOUT AND WITH CONTRAST TECHNIQUE: Multiplanar, multiecho pulse sequences of the brain and surrounding structures were obtained without and with intravenous contrast. CONTRAST:  4.32mL GADAVIST GADOBUTROL 1 MMOL/ML IV SOLN COMPARISON:  PET-CT 07/29/2022.  Head CT 06/17/2010. FINDINGS: Brain: No midline shift, mass effect, or evidence of intracranial mass lesion. No abnormal enhancement identified. No dural thickening. Patchy chronic left lateral cerebellar infarct. Patchy and scattered bilateral cerebral white matter T2 and FLAIR hyperintensity compatible with white matter small vessel ischemia and more pronounced in the right corona radiata. Comparatively mild T2 heterogeneity in the bilateral deep gray nuclei. Right midbrain perivascular space suspected (normal variant). No chronic cerebral blood products. No restricted diffusion to suggest acute infarction. No ventriculomegaly, extra-axial collection or acute intracranial hemorrhage. Cervicomedullary junction and pituitary are within normal limits. Vascular: Major intracranial vascular flow voids are preserved. Generalized intracranial artery tortuosity. The major dural venous sinuses are enhancing and appear to be patent. Skull and upper cervical spine: Bulky degenerative ligamentous hypertrophy about the odontoid. Visualized bone marrow signal is  within normal limits. Negative visible cervical spinal cord. Sinuses/Orbits: Postoperative changes to both globes. Otherwise negative orbits. Paranasal sinuses are well aerated. Other: Trace right mastoid air cell fluid, significance doubtful with negative visible nasopharynx. Left mastoids are clear. And other visible internal auditory structures appear normal. IMPRESSION: 1. No metastatic disease or acute intracranial abnormality. 2. Chronic ischemic disease, mostly small vessel type and mild to moderate for age. Electronically Signed   By: Genevie Ann M.D.   On: 08/15/2022 11:38   DG Chest 1 View  Result Date: 08/15/2022 CLINICAL DATA:  88 year old female with a history of recurrent malignant right pleural effusion. Lung cancer. Status post ultrasound-guided thoracentesis this morning. EXAM: CHEST  1 VIEW COMPARISON:  Chest radiographs 08/09/2022 and earlier. FINDINGS: PA view at 1122 hours. Underlying large lung volumes. No pneumothorax identified. Mostly layering right pleural effusion appears smaller. Small volume of fluid now tracking into the right minor fissure. Mildly improved right lung base ventilation. Stable cardiac size and mediastinal  contours. Left lung appears stable. No acute osseous abnormality identified. Calcified aortic atherosclerosis. Paucity of bowel gas in the upper abdomen. IMPRESSION: 1. Decreased right pleural effusion and no pneumothorax following thoracentesis. 2. Known right lung mass.  No new cardiopulmonary abnormality. Electronically Signed   By: Genevie Ann M.D.   On: 08/15/2022 11:32   IR THORACENTESIS ASP PLEURAL SPACE W/IMG GUIDE  Result Date: 08/09/2022 INDICATION: Lung cancer, recurrent malignant right pleural effusion EXAM: ULTRASOUND GUIDED RIGHT THORACENTESIS MEDICATIONS: 7 cc 1% lidocaine COMPLICATIONS: None immediate. PROCEDURE: An ultrasound guided thoracentesis was thoroughly discussed with the patient and questions answered. The benefits, risks, alternatives and  complications were also discussed. The patient understands and wishes to proceed with the procedure. Written consent was obtained. Ultrasound was performed to localize and mark an adequate pocket of fluid in the right chest. The area was then prepped and draped in the normal sterile fashion. 1% Lidocaine was used for local anesthesia. Under ultrasound guidance a 6 Fr Safe-T-Centesis catheter was introduced. Thoracentesis was performed. The catheter was removed and a dressing applied. FINDINGS: A total of approximately 2.0 L of yellow fluid was removed. Ordering provider did not request laboratory samples IMPRESSION: Successful ultrasound guided right thoracentesis yielding 2.0 L of pleural fluid. Follow-up chest x-ray revealed no evidence of pneumothorax. Read by: Reatha Armour, PA-C Electronically Signed   By: Markus Daft M.D.   On: 08/09/2022 20:12   DG Chest 1 View  Result Date: 08/09/2022 CLINICAL DATA:  Pleural effusion, status post thoracentesis. EXAM: CHEST  1 VIEW COMPARISON:  08/02/2022 FINDINGS: Stable cardiac contours. Aortic tortuosity. Interval decrease in the size of a previously noted right pleural effusion, now small to moderate, with improved aeration in the right lower lung. Right basilar airspace opacities, most likely associated atelectasis. The left lung is clear. No definite pneumothorax. No focal pulmonary opacity. No pleural effusion or pneumothorax. No acute osseous abnormality. IMPRESSION: Interval decrease in the size of a previously noted right pleural effusion, now small to moderate, with improved aeration in the right lower lung. No definite pneumothorax. Electronically Signed   By: Merilyn Baba M.D.   On: 08/09/2022 13:42    Labs:  CBC: Recent Labs    08/17/22 1320 08/23/22 0813 09/02/22 1400 09/03/22 0356  WBC 10.3 6.7 7.4 6.9  HGB 12.1 11.2* 10.6* 9.0*  HCT 35.4* 32.8* 32.0* 27.2*  PLT 444* 309 632* 596*    COAGS: Recent Labs    09/02/22 1319 09/02/22 2121   INR 1.0 1.2    BMP: Recent Labs    08/02/22 1029 08/17/22 1320 08/23/22 0813 09/02/22 1319  NA 133* 132* 132* 130*  K 4.2 3.8 3.7 4.7  CL 100 100 99 95*  CO2 25 25 25 24   GLUCOSE 107* 114* 138* 110*  BUN 12 13 15 16   CALCIUM 9.2 8.7* 8.1* 8.5*  CREATININE 0.64 0.45 0.54 0.58  GFRNONAA >60 >60 >60 >60    LIVER FUNCTION TESTS: Recent Labs    08/02/22 1029 08/17/22 1320 08/23/22 0813 09/02/22 1319  BILITOT 0.4 0.3 0.3 1.0  AST 19 11* 16 20  ALT 15 9 14 13   ALKPHOS 137* 96 85 75  PROT 6.3* 5.2* 5.6* 6.1*  ALBUMIN 3.5 2.8* 1.8* 2.2*    Assessment and Plan:  88 y.o. female inpatient.  History of atrial fibrillation (on Eliquis), hypertension, adenocarcinoma of the right lung with malignant pleural effusion.  Patient has had several thoracentesis.  The most recent being August 30, 2022 with 650  mL of amber-colored fluid removed.  Patient presented to the ED at [facility] on September 02, 2022 with increased shortness of breath.  Found to have a complex right-sided pleural effusion. CT chest from September 02, 2022 reads moderate loculated right pleural effusion with interval gas bubbles, suspect for empyema.  Team is requesting a chest tube for thrombolytics administration.   Patient is on heparin gtt. Last dose of eliquis on 12.29.23.  Sodium 130. All other labs are within acceptable parameters. Allergies include succinylcholine, codeine, hydrocodone.  IR consulted for possible right sided chest tube. Case has been reviewed and procedure approved by Dr. Serafina Royals.  Patient tentatively scheduled for 12.31.23.  Team instructed to: Keep Patient to be NPO after midnight Hold heparin gtt @ 0500. RN made aware  IR will call patient when ready.  Risks and benefits discussed with the patient including bleeding, infection, damage to adjacent structures, bowel perforation/fistula connection, and sepsis.  All of the patient's questions were answered, patient is agreeable to  proceed. Consent signed and in chart.   Thank you for this interesting consult.  I greatly enjoyed meeting Gail Jennings and look forward to participating in their care.  A copy of this report was sent to the requesting provider on this date.  Electronically Signed: Jacqualine Mau, NP 09/03/2022, 2:20 PM   I spent a total of 40 Minutes    in face to face in clinical consultation, greater than 50% of which was counseling/coordinating care for right sided chest tube placement

## 2022-09-03 NOTE — Progress Notes (Signed)
PROGRESS NOTE    Gail Jennings  NID:[id number] DOB: 1933/06/23 DOA: 09/02/2022 PCP: Burnard Bunting, MD   Brief Narrative: No notes on file   Assessment and Plan:  Loculated pleural effusion Located in right lung. Concern for empyema on imaging. -IR consult for chest tube -Continue Vancomycin and Cefepime  Metastatic adenocarcinoma of right lung Malignant pleural effusion Patient follows with oncology, Dr. Julien Nordmann and receives low-dose chemotherapy. Treatment held secondary to acute infection.   Aortic arch mural thrombus Noted on CT chest imaging. Patient is already on anticoagulation for paroxysmal atrial fibrillation. -Continue Heparin IV  Paroxysmal atrial fibrillation Currently in sinus rhythm. On metoprolol and Eliquis. Eliquis transitioned to heparin while inpatient secondary to need for procedures. -Continue metoprolol and Heparin IV  Primary hypertension -Continue metoprolol  Severe protein malnutrition Secondary to cancer   DVT prophylaxis: Heparin IV Code Status:   Code Status: DNR Family Communication: Healthcare advocate at bedside, daughter and niece on telephone Disposition Plan: Discharge pending continued management of likely empyema   Consultants:  Interventional radiology  Procedures:  None  Antimicrobials: Vancomycin Cefepime    Subjective: Patient reports some dyspnea and cough. Hungry.  Objective: BP 119/77 (BP Location: Right Arm)   Pulse 88   Temp 98.8 F (37.1 C) (Oral)   Resp (!) 21   Ht 5\' 4"  (1.626 m)   Wt 48.7 kg   SpO2 96%   BMI 18.43 kg/m   Examination:  General exam: Appears calm and comfortable. Cachectic.  Respiratory system: diminished lung fields in right mid/lower quadrant. Slight tachypnea. Cardiovascular system: S1 & S2 heard, RRR. No murmurs, rubs, gallops or clicks. Gastrointestinal system: Abdomen is nondistended, soft and nontender. Normal bowel sounds heard. Central nervous system: Alert  and oriented. No focal neurological deficits. Musculoskeletal: BLE edema. No calf tenderness Skin: No cyanosis. No rashes Psychiatry: Judgement and insight appear normal. Mood & affect appropriate.    Data Reviewed: I have personally reviewed following labs and imaging studies  CBC Lab Results  Component Value Date   WBC 6.9 09/03/2022   RBC 2.93 (L) 09/03/2022   HGB 9.0 (L) 09/03/2022   HCT 27.2 (L) 09/03/2022   MCV 92.8 09/03/2022   MCH 30.7 09/03/2022   PLT 596 (H) 09/03/2022   MCHC 33.1 09/03/2022   RDW 13.1 09/03/2022   LYMPHSABS 0.4 (L) 09/02/2022   MONOABS 1.8 (H) 09/02/2022   EOSABS 0.0 09/02/2022   BASOSABS 0.0 14/43/1540     Last metabolic panel Lab Results  Component Value Date   NA 130 (L) 09/02/2022   K 4.7 09/02/2022   CL 95 (L) 09/02/2022   CO2 24 09/02/2022   BUN 16 09/02/2022   CREATININE 0.58 09/02/2022   GLUCOSE 110 (H) 09/02/2022   GFRNONAA >60 09/02/2022   GFRAA >60 08/16/2019   CALCIUM 8.5 (L) 09/02/2022   PROT 6.1 (L) 09/02/2022   ALBUMIN 2.2 (L) 09/02/2022   BILITOT 1.0 09/02/2022   ALKPHOS 75 09/02/2022   AST 20 09/02/2022   ALT 13 09/02/2022   ANIONGAP 11 09/02/2022    GFR: Estimated Creatinine Clearance: 38.1 mL/min (by C-G formula based on SCr of 0.58 mg/dL).  Recent Results (from the past 240 hour(s))  Resp panel by RT-PCR (RSV, Flu A&B, Covid) Anterior Nasal Swab     Status: None   Collection Time: 09/02/22  1:22 PM   Specimen: Anterior Nasal Swab  Result Value Ref Range Status   SARS Coronavirus 2 by RT PCR NEGATIVE NEGATIVE Final  Comment: (NOTE) SARS-CoV-2 target nucleic acids are NOT DETECTED.  The SARS-CoV-2 RNA is generally detectable in upper respiratory specimens during the acute phase of infection. The lowest concentration of SARS-CoV-2 viral copies this assay can detect is 138 copies/mL. A negative result does not preclude SARS-Cov-2 infection and should not be used as the sole basis for treatment or other  patient management decisions. A negative result may occur with  improper specimen collection/handling, submission of specimen other than nasopharyngeal swab, presence of viral mutation(s) within the areas targeted by this assay, and inadequate number of viral copies(<138 copies/mL). A negative result must be combined with clinical observations, patient history, and epidemiological information. The expected result is Negative.  Fact Sheet for Patients:  EntrepreneurPulse.com.au  Fact Sheet for Healthcare Providers:  IncredibleEmployment.be  This test is no t yet approved or cleared by the [place] FDA and  has been authorized for detection and/or diagnosis of SARS-CoV-2 by FDA under an Emergency Use Authorization (EUA). This EUA will remain  in effect (meaning this test can be used) for the duration of the COVID-19 declaration under Section 564(b)(1) of the Act, 21 U.S.C.section 360bbb-3(b)(1), unless the authorization is terminated  or revoked sooner.       Influenza A by PCR NEGATIVE NEGATIVE Final   Influenza B by PCR NEGATIVE NEGATIVE Final    Comment: (NOTE) The Xpert Xpress SARS-CoV-2/FLU/RSV plus assay is intended as an aid in the diagnosis of influenza from Nasopharyngeal swab specimens and should not be used as a sole basis for treatment. Nasal washings and aspirates are unacceptable for Xpert Xpress SARS-CoV-2/FLU/RSV testing.  Fact Sheet for Patients: EntrepreneurPulse.com.au  Fact Sheet for Healthcare Providers: IncredibleEmployment.be  This test is not yet approved or cleared by the [place] FDA and has been authorized for detection and/or diagnosis of SARS-CoV-2 by FDA under an Emergency Use Authorization (EUA). This EUA will remain in effect (meaning this test can be used) for the duration of the COVID-19 declaration under Section 564(b)(1) of the Act, 21 U.S.C. section  360bbb-3(b)(1), unless the authorization is terminated or revoked.     Resp Syncytial Virus by PCR NEGATIVE NEGATIVE Final    Comment: (NOTE) Fact Sheet for Patients: EntrepreneurPulse.com.au  Fact Sheet for Healthcare Providers: IncredibleEmployment.be  This test is not yet approved or cleared by the [place] FDA and has been authorized for detection and/or diagnosis of SARS-CoV-2 by FDA under an Emergency Use Authorization (EUA). This EUA will remain in effect (meaning this test can be used) for the duration of the COVID-19 declaration under Section 564(b)(1) of the Act, 21 U.S.C. section 360bbb-3(b)(1), unless the authorization is terminated or revoked.  Performed at [facility], [place] [address], [place], [place] [address]   Surgical PCR screen     Status: None   Collection Time: 09/03/22  6:03 AM   Specimen: Nasal Mucosa; Nasal Swab  Result Value Ref Range Status   MRSA, PCR NEGATIVE NEGATIVE Final   Staphylococcus aureus NEGATIVE NEGATIVE Final    Comment: (NOTE) The Xpert SA Assay (FDA approved for NASAL specimens in patients 71 years of age and older), is one component of a comprehensive surveillance program. It is not intended to diagnose infection nor to guide or monitor treatment. Performed at [facility], [place] [address], [place], [place] [address]       Radiology Studies: [place] CHEST (PLEURAL EFFUSION)  Result Date: 09/02/2022 CLINICAL DATA:  88 year old female with recent diagnosis lung cancer recurrent right pleural effusion.  The patient presents to the emergency department shortness of breath. EXAM: CHEST ULTRASOUND COMPARISON:  08/09/2022, 08/15/2022, 08/24/2022, 08/30/2022 FINDINGS: Highly complex large right pleural effusion. Multiple echo it septations are present. Debris is visualized within multiple larger fluid pockets. IMPRESSION: Complex, septated right pleural effusion. No thoracentesis  was performed at this time due to increased degree of complexity. Recommend attention on forthcoming chest CT. Ruthann Cancer, MD [facility] Electronically Signed   By: Ruthann Cancer M.D.   On: 09/02/2022 16:07   CT Chest W Contrast  Result Date: 09/02/2022 CLINICAL DATA:  Pleural effusion shortness of breath EXAM: CT CHEST WITH CONTRAST TECHNIQUE: Multidetector CT imaging of the chest was performed during intravenous contrast administration. RADIATION DOSE REDUCTION: This exam was performed according to the departmental dose-optimization program which includes automated exposure control, adjustment of the mA and/or kV according to patient size and/or use of iterative reconstruction technique. CONTRAST:  63mL OMNIPAQUE IOHEXOL 300 MG/ML  SOLN COMPARISON:  Chest x-ray 09/02/2022, PET CT 07/29/2022, chest CT 07/12/2022 FINDINGS: Cardiovascular: Moderate aortic atherosclerosis. No aneurysm. Small volume irregular free-floating mural thrombus at the aortic arch, series 2, image 34. Normal cardiac size. No pericardial effusion. Mediastinum/Nodes: Midline trachea. No thyroid mass. Mild mediastinal and right hilar nodes. Right hilar node measures 14 mm, series 2, image 67. Fluid distension of the esophagus. Lungs/Pleura: Circumferential right pleural thickening raising concern for pleural metastatic disease. Moderate loculated right pleural effusion with interval gas bubbles. Consolidation/mass at the right middle and lower lobes with additional ground-glass density in the right upper and middle lobes which could be due to superimposed infection. Emphysema. Upper Abdomen: Redemonstrated incompletely visualized fluid collection posterior to the right hepatic lobe, this measures 3.9 cm compared with 3.9 cm previously. Musculoskeletal: No acute osseous abnormality. Mild rim enhancing fluid collection within the right posterior chest wall, measuring approximately  6.2 x 1 cm, series 2, image 98, question communication with pleural space at the right 9-10 intercostal space. IMPRESSION: 1. Circumferential right pleural thickening raising concern for pleural metastatic disease. Moderate loculated right pleural effusion with interval gas bubbles, suspect for empyema. Consolidation/mass at the right middle and lower lobes slightly progressive compared to the prior PET CT. Additional ground-glass densities in the right upper and middle lobes potentially due to acute infection. 2. Rim enhancing fluid collection within the right posterior chest wall measuring approximately 6.2 x 1 cm, question communication with pleural space at the right 9-10 intercostal space. 3. Small volume irregular free-floating mural thrombus at the aortic arch, increased risk for thromboembolic phenomenon. 4. Emphysema. 5. Redemonstrated incompletely visualized fluid collection posterior to the right hepatic lobe measuring 3.9 cm. 6. Aortic atherosclerosis. Aortic Atherosclerosis (ICD10-I70.0) and Emphysema (ICD10-J43.9). Electronically Signed   By: Donavan Foil M.D.   On: 09/02/2022 15:57   DG Chest 2 View  Result Date: 09/02/2022 CLINICAL DATA:  Shortness of breath. EXAM: CHEST - 2 VIEW COMPARISON:  August 30, 2022. FINDINGS: The heart size and mediastinal contours are within normal limits. Left lung is unremarkable. There appears to be a large loculated hydropneumothorax or possible empyema seen posteriorly in right hemithorax. Right basilar atelectasis or infiltrate is noted. The visualized skeletal structures are unremarkable. IMPRESSION: Probable large loculated right-sided hydropneumothorax or possible empyema is noted posteriorly. CT scan with intravenous contrast is recommended for further evaluation. Electronically Signed   By: Marijo Conception M.D.   On: 09/02/2022 13:05      LOS: 1 day    Cordelia Poche,  MD [facility] 09/03/2022, 1:20 PM   If 7PM-7AM, please contact  night-coverage [email or web address]

## 2022-09-03 NOTE — Plan of Care (Signed)

## 2022-09-03 NOTE — Progress Notes (Signed)
[place] for Heparin Indication: mural thrombus at the aorticarch  Allergies  Allergen Reactions   Succinylcholine Chloride Other (See Comments)    Prolonged paralysis Brand name Anectine   Codeine Nausea And Vomiting   Hydrocodone Nausea And Vomiting    Patient Measurements: Height: 5\' 4"  (162.6 cm) Weight: 48.7 kg (107 lb 5.8 oz) IBW/kg (Calculated) : 54.7 Heparin Dosing Weight: 47.8 kg  Vital Signs: Temp: 98.7 F (37.1 C) (12/30 0132) Temp Source: Oral (12/30 0132) BP: 124/66 (12/30 0132) Pulse Rate: 86 (12/30 0132)  Labs: Recent Labs    09/02/22 1319 09/02/22 1400 09/02/22 2121 09/03/22 0356  HGB  --  10.6*  --  9.0*  HCT  --  32.0*  --  27.2*  PLT  --  632*  --  596*  LABPROT 13.1  --  15.3*  --   INR 1.0  --  1.2  --   HEPARINUNFRC  --   --  0.44 <0.10*  CREATININE 0.58  --   --   --      Estimated Creatinine Clearance: 38.1 mL/min (by C-G formula based on SCr of 0.58 mg/dL).   Medical History: Past Medical History:  Diagnosis Date   Atrial fibrillation ([place])    Complication of anesthesia    cannot take anectine   Hypertension    Inguinal hernia     Assessment: Active Problem(s): Difficulty breathing (previous thoracentesis 12/26)  PMH: hypertension, paroxysmal atrial fibrillation on Eliquis was diagnosed with stage IV adenocarcinoma right lung in November 23, malignant pleural effusions has required 4-5 thoracentesis in the last 6 weeks, inguinal hernia  Significant events:  12/26 Thoracentesis 636ml  AC/Heme: 12/29 CT: Small volume irregular free-floating mural thrombus at the aortic arch, increased risk for thromboembolic phenomenon. Start IV heparin. - h/o afib but no longer on Eliquis.  12/30 AM update:  Heparin level low  Goal of Therapy:  Heparin level 0.3-0.7 units/ml Monitor platelets by anticoagulation protocol: Yes   Plan:  Heparin 1500 units bolus Inc heparin to 900 units/hr 1300  heparin level  Narda Bonds, PharmD, BCPS Clinical Pharmacist Phone: [phone]

## 2022-09-04 ENCOUNTER — Inpatient Hospital Stay (HOSPITAL_COMMUNITY): Payer: Medicare Other

## 2022-09-04 DIAGNOSIS — I480 Paroxysmal atrial fibrillation: Secondary | ICD-10-CM

## 2022-09-04 DIAGNOSIS — J869 Pyothorax without fistula: Secondary | ICD-10-CM | POA: Diagnosis not present

## 2022-09-04 DIAGNOSIS — C3491 Malignant neoplasm of unspecified part of right bronchus or lung: Secondary | ICD-10-CM | POA: Diagnosis not present

## 2022-09-04 DIAGNOSIS — I10 Essential (primary) hypertension: Secondary | ICD-10-CM | POA: Diagnosis not present

## 2022-09-04 LAB — CBC
HCT: 31.5 % — ABNORMAL LOW (ref 36.0–46.0)
Hemoglobin: 10.6 g/dL — ABNORMAL LOW (ref 12.0–15.0)
MCH: 30.8 pg (ref 26.0–34.0)
MCHC: 33.7 g/dL (ref 30.0–36.0)
MCV: 91.6 fL (ref 80.0–100.0)
Platelets: 777 10*3/uL — ABNORMAL HIGH (ref 150–400)
RBC: 3.44 MIL/uL — ABNORMAL LOW (ref 3.87–5.11)
RDW: 13.1 % (ref 11.5–15.5)
WBC: 8.9 10*3/uL (ref 4.0–10.5)
nRBC: 0 % (ref 0.0–0.2)

## 2022-09-04 LAB — BASIC METABOLIC PANEL
Anion gap: 13 (ref 5–15)
BUN: 15 mg/dL (ref 8–23)
CO2: 18 mmol/L — ABNORMAL LOW (ref 22–32)
Calcium: 8.1 mg/dL — ABNORMAL LOW (ref 8.9–10.3)
Chloride: 100 mmol/L (ref 98–111)
Creatinine, Ser: 0.64 mg/dL (ref 0.44–1.00)
GFR, Estimated: >60 mL/min (ref >60)
Glucose, Bld: 109 mg/dL — ABNORMAL HIGH (ref 70–99)
Potassium: 3.9 mmol/L (ref 3.5–5.1)
Sodium: 131 mmol/L — ABNORMAL LOW (ref 135–145)

## 2022-09-04 LAB — HEPARIN LEVEL (UNFRACTIONATED): Heparin Unfractionated: 0.49 IU/mL (ref 0.30–0.70)

## 2022-09-04 LAB — MAGNESIUM: Magnesium: 1.6 mg/dL — ABNORMAL LOW (ref 1.7–2.4)

## 2022-09-04 MED ORDER — LOPERAMIDE HCL 2 MG PO CAPS
2.0000 mg | ORAL_CAPSULE | ORAL | Status: AC | PRN
  Administered 2022-09-04: 2 mg via ORAL
  Filled 2022-09-04: qty 1

## 2022-09-04 MED ORDER — METOPROLOL TARTRATE 5 MG/5ML IV SOLN
2.5000 mg | Freq: Four times a day (QID) | INTRAVENOUS | Status: DC | PRN
  Administered 2022-09-04 – 2022-09-12 (×5): 2.5 mg via INTRAVENOUS
  Filled 2022-09-04 (×5): qty 5

## 2022-09-04 MED ORDER — METOPROLOL TARTRATE 5 MG/5ML IV SOLN
2.5000 mg | Freq: Once | INTRAVENOUS | Status: AC
  Administered 2022-09-04: 2.5 mg via INTRAVENOUS
  Filled 2022-09-04: qty 5

## 2022-09-04 MED ORDER — SODIUM CHLORIDE 0.9 % IV BOLUS
500.0000 mL | Freq: Once | INTRAVENOUS | Status: AC
  Administered 2022-09-04: 500 mL via INTRAVENOUS

## 2022-09-04 MED ORDER — HEPARIN (PORCINE) 25000 UT/250ML-% IV SOLN
1100.0000 [IU]/h | INTRAVENOUS | Status: DC
  Administered 2022-09-04 – 2022-09-08 (×6): 1100 [IU]/h via INTRAVENOUS
  Filled 2022-09-04 (×5): qty 250

## 2022-09-04 MED ORDER — MELATONIN 3 MG PO TABS
3.0000 mg | ORAL_TABLET | Freq: Every evening | ORAL | Status: DC | PRN
  Administered 2022-09-14: 3 mg via ORAL
  Filled 2022-09-04 (×3): qty 1

## 2022-09-04 MED ORDER — FENTANYL CITRATE (PF) 100 MCG/2ML IJ SOLN
INTRAMUSCULAR | Status: AC
  Filled 2022-09-04: qty 4

## 2022-09-04 MED ORDER — MIDAZOLAM HCL 2 MG/2ML IJ SOLN
INTRAMUSCULAR | Status: AC | PRN
  Administered 2022-09-04: 1 mg via INTRAVENOUS
  Administered 2022-09-04: .5 mg via INTRAVENOUS

## 2022-09-04 MED ORDER — SACCHAROMYCES BOULARDII 250 MG PO CAPS
250.0000 mg | ORAL_CAPSULE | Freq: Two times a day (BID) | ORAL | Status: AC
  Administered 2022-09-04 – 2022-09-05 (×4): 250 mg via ORAL
  Filled 2022-09-04 (×4): qty 1

## 2022-09-04 MED ORDER — LIDOCAINE HCL 1 % IJ SOLN
10.0000 mL | Freq: Once | INTRAMUSCULAR | Status: AC
  Administered 2022-09-04: 10 mL via INTRADERMAL

## 2022-09-04 MED ORDER — MIDAZOLAM HCL 2 MG/2ML IJ SOLN
INTRAMUSCULAR | Status: AC
  Filled 2022-09-04: qty 4

## 2022-09-04 MED ORDER — FENTANYL CITRATE (PF) 100 MCG/2ML IJ SOLN
INTRAMUSCULAR | Status: AC | PRN
  Administered 2022-09-04: 25 ug via INTRAVENOUS

## 2022-09-04 MED ORDER — SODIUM CHLORIDE 0.9% FLUSH
10.0000 mL | Freq: Three times a day (TID) | INTRAVENOUS | Status: DC
  Administered 2022-09-04 – 2022-09-08 (×12): 10 mL via INTRAPLEURAL

## 2022-09-04 NOTE — Progress Notes (Signed)
PROGRESS NOTE    BETSIE PECKMAN  EVO:[id number] DOB: 03/01/1933 DOA: 09/02/2022 PCP: Burnard Bunting, MD   Brief Narrative: Gail Jennings is a 88 y.o. female with a history of hypertension, PAF on Eliquis, metastatic adenocarcinoma of right lung, recurrent malignant effusion. Patient presented secondary to short ness of breath with evidence of empyema/pneumonia on chest imaging. Empiric antibiotics started. Patient transferred to [facility] for cardiothoracic surgery consult who instead recommended IR for chest tube placement. Chest tube placed on 12/31.   Assessment and Plan:  Loculated pleural effusion Located in right lung. Concern for empyema on imaging. -IR consult for chest tube -Continue Vancomycin and Cefepime  Metastatic adenocarcinoma of right lung Malignant pleural effusion Patient follows with oncology, Dr. Julien Nordmann and receives low-dose chemotherapy. Treatment held secondary to acute infection.   Aortic arch mural thrombus Noted on CT chest imaging. Patient is already on anticoagulation for paroxysmal atrial fibrillation. -Continue Heparin IV  Acute respiratory failure with hypoxia Secondary to pneumonia and recurrent pleural effusion with concern for empyema. -Wean to room air as able  Paroxysmal atrial fibrillation with RVR Converted into atrial fibrillation. On metoprolol and Eliquis. Eliquis transitioned to heparin while inpatient secondary to need for procedures. Now with RVR -Continue metoprolol and Heparin IV -Metoprolol IV  -Continue telemetry  Primary hypertension -Continue metoprolol  Severe protein malnutrition Secondary to cancer   DVT prophylaxis: Heparin IV Code Status:   Code Status: DNR Family Communication: Healthcare advocate at bedside, daughter and niece on telephone Disposition Plan: Discharge pending continued management of likely empyema   Consultants:  Interventional radiology  Procedures:   None  Antimicrobials: Vancomycin Cefepime    Subjective: Patient feels better this afternoon after chest tube placement. No issues overnight. Still with some dyspnea.  Objective: BP 110/73 (BP Location: Right Arm)   Pulse 83   Temp 97.7 F (36.5 C) (Oral)   Resp 17   Ht 5\' 4"  (1.626 m)   Wt 48.7 kg   SpO2 92%   BMI 18.43 kg/m   Examination:  General exam: Appears calm and comfortable. Cachectic Respiratory system: Mild wheezing on left lung with severely diminished breath sounds of right mid/lower lobes. Respiratory effort slightly increased with normal respiratory rate. Cardiovascular system: S1 & S2 heard, irregular rhythm with fast rate. Gastrointestinal system: Abdomen is nondistended, soft and nontender.  Normal bowel sounds heard. Central nervous system: Alert and oriented. No focal neurological deficits. Musculoskeletal: No edema. No calf tenderness Skin: No cyanosis. No rashes Psychiatry: Judgement and insight appear normal. Mood & affect appropriate.    Data Reviewed: I have personally reviewed following labs and imaging studies  CBC Lab Results  Component Value Date   WBC 8.9 09/04/2022   RBC 3.44 (L) 09/04/2022   HGB 10.6 (L) 09/04/2022   HCT 31.5 (L) 09/04/2022   MCV 91.6 09/04/2022   MCH 30.8 09/04/2022   PLT 777 (H) 09/04/2022   MCHC 33.7 09/04/2022   RDW 13.1 09/04/2022   LYMPHSABS 0.4 (L) 09/02/2022   MONOABS 1.8 (H) 09/02/2022   EOSABS 0.0 09/02/2022   BASOSABS 0.0 29/93/7169     Last metabolic panel Lab Results  Component Value Date   NA 130 (L) 09/02/2022   K 4.7 09/02/2022   CL 95 (L) 09/02/2022   CO2 24 09/02/2022   BUN 16 09/02/2022   CREATININE 0.58 09/02/2022   GLUCOSE 110 (H) 09/02/2022   GFRNONAA >60 09/02/2022   GFRAA >60 08/16/2019   CALCIUM 8.5 (L) 09/02/2022  PROT 6.1 (L) 09/02/2022   ALBUMIN 2.2 (L) 09/02/2022   BILITOT 1.0 09/02/2022   ALKPHOS 75 09/02/2022   AST 20 09/02/2022   ALT 13 09/02/2022   ANIONGAP  11 09/02/2022    GFR: Estimated Creatinine Clearance: 38.1 mL/min (by C-G formula based on SCr of 0.58 mg/dL).  Recent Results (from the past 240 hour(s))  Resp panel by RT-PCR (RSV, Flu A&B, Covid) Anterior Nasal Swab     Status: None   Collection Time: 09/02/22  1:22 PM   Specimen: Anterior Nasal Swab  Result Value Ref Range Status   SARS Coronavirus 2 by RT PCR NEGATIVE NEGATIVE Final    Comment: (NOTE) SARS-CoV-2 target nucleic acids are NOT DETECTED.  The SARS-CoV-2 RNA is generally detectable in upper respiratory specimens during the acute phase of infection. The lowest concentration of SARS-CoV-2 viral copies this assay can detect is 138 copies/mL. A negative result does not preclude SARS-Cov-2 infection and should not be used as the sole basis for treatment or other patient management decisions. A negative result may occur with  improper specimen collection/handling, submission of specimen other than nasopharyngeal swab, presence of viral mutation(s) within the areas targeted by this assay, and inadequate number of viral copies(<138 copies/mL). A negative result must be combined with clinical observations, patient history, and epidemiological information. The expected result is Negative.  Fact Sheet for Patients:  EntrepreneurPulse.com.au  Fact Sheet for Healthcare Providers:  IncredibleEmployment.be  This test is no t yet approved or cleared by the [place] FDA and  has been authorized for detection and/or diagnosis of SARS-CoV-2 by FDA under an Emergency Use Authorization (EUA). This EUA will remain  in effect (meaning this test can be used) for the duration of the COVID-19 declaration under Section 564(b)(1) of the Act, 21 U.S.C.section 360bbb-3(b)(1), unless the authorization is terminated  or revoked sooner.       Influenza A by PCR NEGATIVE NEGATIVE Final   Influenza B by PCR NEGATIVE NEGATIVE Final    Comment:  (NOTE) The Xpert Xpress SARS-CoV-2/FLU/RSV plus assay is intended as an aid in the diagnosis of influenza from Nasopharyngeal swab specimens and should not be used as a sole basis for treatment. Nasal washings and aspirates are unacceptable for Xpert Xpress SARS-CoV-2/FLU/RSV testing.  Fact Sheet for Patients: EntrepreneurPulse.com.au  Fact Sheet for Healthcare Providers: IncredibleEmployment.be  This test is not yet approved or cleared by the [place] FDA and has been authorized for detection and/or diagnosis of SARS-CoV-2 by FDA under an Emergency Use Authorization (EUA). This EUA will remain in effect (meaning this test can be used) for the duration of the COVID-19 declaration under Section 564(b)(1) of the Act, 21 U.S.C. section 360bbb-3(b)(1), unless the authorization is terminated or revoked.     Resp Syncytial Virus by PCR NEGATIVE NEGATIVE Final    Comment: (NOTE) Fact Sheet for Patients: EntrepreneurPulse.com.au  Fact Sheet for Healthcare Providers: IncredibleEmployment.be  This test is not yet approved or cleared by the [place] FDA and has been authorized for detection and/or diagnosis of SARS-CoV-2 by FDA under an Emergency Use Authorization (EUA). This EUA will remain in effect (meaning this test can be used) for the duration of the COVID-19 declaration under Section 564(b)(1) of the Act, 21 U.S.C. section 360bbb-3(b)(1), unless the authorization is terminated or revoked.  Performed at [facility], [place] [address], [place], [place] [address]   Surgical PCR screen     Status: None   Collection Time: 09/03/22  6:03 AM  Specimen: Nasal Mucosa; Nasal Swab  Result Value Ref Range Status   MRSA, PCR NEGATIVE NEGATIVE Final   Staphylococcus aureus NEGATIVE NEGATIVE Final    Comment: (NOTE) The Xpert SA Assay (FDA approved for NASAL specimens in patients 14 years  of age and older), is one component of a comprehensive surveillance program. It is not intended to diagnose infection nor to guide or monitor treatment. Performed at [facility], [place] [address], [place], [place] [address]       Radiology Studies: [place] CHEST (PLEURAL EFFUSION)  Result Date: 09/02/2022 CLINICAL DATA:  88 year old female with recent diagnosis lung cancer recurrent right pleural effusion. The patient presents to the emergency department shortness of breath. EXAM: CHEST ULTRASOUND COMPARISON:  08/09/2022, 08/15/2022, 08/24/2022, 08/30/2022 FINDINGS: Highly complex large right pleural effusion. Multiple echo it septations are present. Debris is visualized within multiple larger fluid pockets. IMPRESSION: Complex, septated right pleural effusion. No thoracentesis was performed at this time due to increased degree of complexity. Recommend attention on forthcoming chest CT. Ruthann Cancer, MD [facility] Radiology Electronically Signed   By: Ruthann Cancer M.D.   On: 09/02/2022 16:07   CT Chest W Contrast  Result Date: 09/02/2022 CLINICAL DATA:  Pleural effusion shortness of breath EXAM: CT CHEST WITH CONTRAST TECHNIQUE: Multidetector CT imaging of the chest was performed during intravenous contrast administration. RADIATION DOSE REDUCTION: This exam was performed according to the departmental dose-optimization program which includes automated exposure control, adjustment of the mA and/or kV according to patient size and/or use of iterative reconstruction technique. CONTRAST:  61mL OMNIPAQUE IOHEXOL 300 MG/ML  SOLN COMPARISON:  Chest x-ray 09/02/2022, PET CT 07/29/2022, chest CT 07/12/2022 FINDINGS: Cardiovascular: Moderate aortic atherosclerosis. No aneurysm. Small volume irregular free-floating mural thrombus at the aortic arch, series 2, image 34. Normal cardiac size. No pericardial effusion. Mediastinum/Nodes: Midline trachea. No  thyroid mass. Mild mediastinal and right hilar nodes. Right hilar node measures 14 mm, series 2, image 67. Fluid distension of the esophagus. Lungs/Pleura: Circumferential right pleural thickening raising concern for pleural metastatic disease. Moderate loculated right pleural effusion with interval gas bubbles. Consolidation/mass at the right middle and lower lobes with additional ground-glass density in the right upper and middle lobes which could be due to superimposed infection. Emphysema. Upper Abdomen: Redemonstrated incompletely visualized fluid collection posterior to the right hepatic lobe, this measures 3.9 cm compared with 3.9 cm previously. Musculoskeletal: No acute osseous abnormality. Mild rim enhancing fluid collection within the right posterior chest wall, measuring approximately 6.2 x 1 cm, series 2, image 98, question communication with pleural space at the right 9-10 intercostal space. IMPRESSION: 1. Circumferential right pleural thickening raising concern for pleural metastatic disease. Moderate loculated right pleural effusion with interval gas bubbles, suspect for empyema. Consolidation/mass at the right middle and lower lobes slightly progressive compared to the prior PET CT. Additional ground-glass densities in the right upper and middle lobes potentially due to acute infection. 2. Rim enhancing fluid collection within the right posterior chest wall measuring approximately 6.2 x 1 cm, question communication with pleural space at the right 9-10 intercostal space. 3. Small volume irregular free-floating mural thrombus at the aortic arch, increased risk for thromboembolic phenomenon. 4. Emphysema. 5. Redemonstrated incompletely visualized fluid collection posterior to the right hepatic lobe measuring 3.9 cm. 6. Aortic atherosclerosis. Aortic Atherosclerosis (ICD10-I70.0) and Emphysema (ICD10-J43.9). Electronically Signed   By: Donavan Foil M.D.   On: 09/02/2022 15:57      LOS: 2 days  Cordelia Poche, MD [facility] 09/04/2022, 1:47 PM   If 7PM-7AM, please contact night-coverage [email or web address]

## 2022-09-04 NOTE — Progress Notes (Signed)
   09/04/22 0811  Assess: MEWS Score  Temp 98 F (36.7 C)  BP 133/80  MAP (mmHg) 93  Pulse Rate (!) 122  Resp 18  SpO2 99 %  O2 Device Nasal Cannula  Assess: MEWS Score  MEWS Temp 0  MEWS Systolic 0  MEWS Pulse 2  MEWS RR 0  MEWS LOC 0  MEWS Score 2  MEWS Score Color Yellow  Assess: if the MEWS score is Yellow or Red  Were vital signs taken at a resting state? Yes  Focused Assessment No change from prior assessment  Does the patient meet 2 or more of the SIRS criteria? No  MEWS guidelines implemented *See Row Information* Yes  Treat  MEWS Interventions Administered scheduled meds/treatments  Take Vital Signs  Increase Vital Sign Frequency  Yellow: Q 2hr X 2 then Q 4hr X 2, if remains yellow, continue Q 4hrs  Escalate  MEWS: Escalate Yellow: discuss with charge nurse/RN and consider discussing with provider and RRT  Notify: Charge Nurse/RN  Name of Charge Nurse/RN Notified Ria Comment RN  Date Charge Nurse/RN Notified 09/04/22  Time Charge Nurse/RN Notified [phone]  Provider Notification  Provider Name/Title Dr. Lonny Prude  Date Provider Notified 09/04/22  Time Provider Notified [phone]  Method of Notification Page  Notification Reason Other (Comment) (Yellow MEWS)  Provider response See new orders  Date of Provider Response 09/04/22  Time of Provider Response 1200  Document  Progress note created (see row info) Yes  Assess: SIRS CRITERIA  SIRS Temperature  0  SIRS Pulse 1  SIRS Respirations  0  SIRS WBC 0  SIRS Score Sum  1

## 2022-09-04 NOTE — Procedures (Signed)
Interventional Radiology Procedure Note  Procedure: CT 10FR RT CHEST TUBE    Complications: None  Estimated Blood Loss:  MIN  Findings: AMBER SEROSANG FLD.  CX Christene Lye, MD

## 2022-09-04 NOTE — Plan of Care (Signed)
  Problem: Education: Goal: Knowledge of General Education information will improve Description: Including pain rating scale, medication(s)/side effects and non-pharmacologic comfort measures Outcome: Progressing   Problem: Health Behavior/Discharge Planning: Goal: Ability to manage health-related needs will improve Outcome: Progressing   Problem: Clinical Measurements: Goal: Respiratory complications will improve Outcome: Progressing   Problem: Activity: Goal: Risk for activity intolerance will decrease Outcome: Progressing   Problem: Nutrition: Goal: Adequate nutrition will be maintained Outcome: Progressing   Problem: Coping: Goal: Level of anxiety will decrease Outcome: Progressing   Problem: Pain Managment: Goal: General experience of comfort will improve Outcome: Progressing   Problem: Elimination: Goal: Will not experience complications related to bowel motility Outcome: Progressing Goal: Will not experience complications related to urinary retention Outcome: Progressing   Problem: Safety: Goal: Ability to remain free from injury will improve Outcome: Progressing

## 2022-09-04 NOTE — Progress Notes (Signed)
Mobility Specialist Progress Note   09/04/22 1200  Oxygen Therapy  SpO2 92 %  O2 Device Nasal Cannula  O2 Flow Rate (L/min) 4 L/min  Mobility  Activity Turned to back - supine  Level of Assistance Standby assist, set-up cues, supervision of patient - no hands on  Assistive Device Other (Comment) (HHA)  Range of Motion/Exercises Right leg;Left leg  Activity Response Tolerated well  $Mobility charge 1 Mobility   Pre Mobility: 83 HR,110/73 BP, 92% SpO2 on 4LO2 During Mobility: 102 HR, 88% SpO2 on 4LO2 Post Mobility: 90 HR, 91% SpO2 on 4LO2  Received in bed deferring ambulation d/t exhaustion but agreeable to bed level exercises. Pt c/o R side chest pain, dehydration and weakness throughout exercises but able to complete w/ good ROM and decent strength while VSS. Left sitting up w/ no further complaints and call bell in reach.   Holland Falling Mobility Specialist Please contact via SecureChat or  Rehab office at [phone]

## 2022-09-04 NOTE — Hospital Course (Signed)
**Note Gail-Identified via Obfuscation** [name] is a 88 y.o. female with a history of hypertension, PAF on Eliquis, metastatic adenocarcinoma of right lung, recurrent malignant effusion. Patient presented secondary to short ness of breath with evidence of empyema/pneumonia on chest imaging. Empiric antibiotics started. Patient transferred to [facility] for cardiothoracic surgery consult who instead recommended IR for chest tube placement. Chest tube placed on 12/31.

## 2022-09-04 NOTE — Progress Notes (Signed)
[facility] for Heparin Indication: atrial fibrillation mural thrombus at the aortic arch Brief A/P: Heparin level within goal range Continue Heparin at current rate    Allergies  Allergen Reactions   Succinylcholine Chloride Other (See Comments)    Prolonged paralysis Brand name Anectine   Codeine Nausea And Vomiting   Hydrocodone Nausea And Vomiting    Patient Measurements: Height: 5\' 4"  (162.6 cm) Weight: 48.7 kg (107 lb 5.8 oz) IBW/kg (Calculated) : 54.7 Heparin Dosing Weight: 47.8 kg  Vital Signs: Temp: 97.8 F (36.6 C) (12/31 2332) Temp Source: Oral (12/31 2332) BP: 112/74 (12/31 2332) Pulse Rate: 97 (12/31 2332)  Labs: Recent Labs     0000 09/02/22 1319 09/02/22 1400 09/02/22 2121 09/02/22 2121 09/03/22 0356 09/03/22 1300 09/03/22 2154 09/04/22 0655 09/04/22 1600 09/04/22 2247  HGB   < >  --  10.6*  --   --  9.0*  --   --  10.6*  --   --   HCT  --   --  32.0*  --   --  27.2*  --   --  31.5*  --   --   PLT  --   --  632*  --   --  596*  --   --  777*  --   --   LABPROT  --  13.1  --  15.3*  --   --   --   --   --   --   --   INR  --  1.0  --  1.2  --   --   --   --   --   --   --   HEPARINUNFRC  --   --   --  0.44   < > <0.10* 0.15* 0.25*  --   --  0.49  CREATININE  --  0.58  --   --   --   --   --   --   --  0.64  --    < > = values in this interval not displayed.     Estimated Creatinine Clearance: 38.1 mL/min (by C-G formula based on SCr of 0.64 mg/dL).   Assessment: 88 y.o. female with h/o Afib and mural thrombus s/p chest tube placement for heparin  Goal of Therapy:  Heparin level 0.3-0.7 units/ml Monitor platelets by anticoagulation protocol: Yes   Plan:  Continue Heparin at current rate  Phillis Knack, PharmD, BCPS

## 2022-09-04 NOTE — Progress Notes (Signed)
   09/04/22 1926  Vitals  Temp 98.7 F (37.1 C)  Temp Source Oral  BP 125/82  MAP (mmHg) 95  BP Location Right Arm  BP Method Automatic  Patient Position (if appropriate) Lying  Pulse Rate (!) 139  Pulse Rate Source Monitor  Resp 18  MEWS COLOR  MEWS Score Color Yellow  Oxygen Therapy  SpO2 97 %  O2 Device Nasal Cannula  O2 Flow Rate (L/min) 4 L/min  MEWS Score  MEWS Temp 0  MEWS Systolic 0  MEWS Pulse 3  MEWS RR 0  MEWS LOC 0  MEWS Score 3   Admin 0.25 mg metoprolol IV. Pulse currently @ 105 in Afib.

## 2022-09-04 NOTE — Progress Notes (Signed)
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  Allergies  Allergen Reactions   Succinylcholine Chloride Other (See Comments)    Prolonged paralysis Brand name Anectine   Codeine Nausea And Vomiting   Hydrocodone Nausea And Vomiting    Patient Measurements: Height: 5\' 4"  (162.6 cm) Weight: 48.7 kg (107 lb 5.8 oz) IBW/kg (Calculated) : 54.7 Heparin Dosing Weight: 48.7 kg  Vital Signs: Temp: 98 F (36.7 C) (12/31 0811) Temp Source: Oral (12/31 0811) BP: 101/79 (12/31 1045) Pulse Rate: 108 (12/31 1045)  Labs: Recent Labs     0000 09/02/22 1319 09/02/22 1400 09/02/22 2121 09/02/22 2121 09/03/22 0356 09/03/22 1300 09/03/22 2154 09/04/22 0655  HGB   < >  --  10.6*  --   --  9.0*  --   --  10.6*  HCT  --   --  32.0*  --   --  27.2*  --   --  31.5*  PLT  --   --  632*  --   --  596*  --   --  777*  LABPROT  --  13.1  --  15.3*  --   --   --   --   --   INR  --  1.0  --  1.2  --   --   --   --   --   HEPARINUNFRC  --   --   --  0.44   < > <0.10* 0.15* 0.25*  --   CREATININE  --  0.58  --   --   --   --   --   --   --    < > = values in this interval not displayed.     Estimated Creatinine Clearance: 38.1 mL/min (by C-G formula based on SCr of 0.58 mg/dL).   Medications:  Scheduled:   benzonatate  200 mg Oral TID   folic acid  1 mg Oral Daily   lidocaine  10 mL Intradermal Once   metoprolol tartrate  12.5 mg Oral BID   pneumococcal 20-valent conjugate vaccine  0.5 mL Intramuscular Tomorrow-1000   saccharomyces boulardii  250 mg Oral BID   Infusions:   ceFEPime (MAXIPIME) IV 2 g (09/04/22 0107)   vancomycin      Assessment: 30 yoF presenting with difficulty breathing, previous thoracentesis 12/26. PMH significant for HTN, paroxysmal Afib (no longer on Eliquis), diagnosed with stage IV adenocarcinoma of right lung in November 23, malignant pleural effusions has  required 4-5 thoracentesis in the last 6 weeks, inguinal hernia. 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 Pharmacy consulted to start IV heparin.   Heparin level remains subtherapeutic (0.25) on 1000 units/hr. She was given a bolus of 750 units and increased her rate to 1100 units/hr prior to discontinuing at 0500 for a 0900 CT RT chest tube placement procedure. No line issues or signs/symptoms of bleeding noted. Per the consult from radiology, the bleeding risk is low and the estimated blood loss during the procedure was low. Previous heparin level was subtherapeutic and given previous level and risk of bleeding, will initiate at previous heparin rate 4 hours after the end of the procedure with close follow up.  Goal of Therapy:  Heparin level 0.3-0.7 units/ml Monitor platelets by anticoagulation protocol: Yes   Plan:  Restart IV heparin at 1100 units/hr 4 hours  after the procedure. F/u heparin level in 8h Daily heparin level, CBC Monitor for s/sx of bleeding   Varney Daily, PharmD PGY2 Pharmacy Resident  Please check AMION for all [facility] pharmacy phone numbers After 10:00 PM call main pharmacy [phone]

## 2022-09-05 DIAGNOSIS — J869 Pyothorax without fistula: Secondary | ICD-10-CM | POA: Diagnosis not present

## 2022-09-05 DIAGNOSIS — I10 Essential (primary) hypertension: Secondary | ICD-10-CM | POA: Diagnosis not present

## 2022-09-05 DIAGNOSIS — C3491 Malignant neoplasm of unspecified part of right bronchus or lung: Secondary | ICD-10-CM | POA: Diagnosis not present

## 2022-09-05 LAB — CBC
HCT: 33.3 % — ABNORMAL LOW (ref 36.0–46.0)
Hemoglobin: 11.1 g/dL — ABNORMAL LOW (ref 12.0–15.0)
MCH: 30.7 pg (ref 26.0–34.0)
MCHC: 33.3 g/dL (ref 30.0–36.0)
MCV: 92.2 fL (ref 80.0–100.0)
Platelets: 874 10*3/uL — ABNORMAL HIGH (ref 150–400)
RBC: 3.61 MIL/uL — ABNORMAL LOW (ref 3.87–5.11)
RDW: 13.3 % (ref 11.5–15.5)
WBC: 10.6 10*3/uL — ABNORMAL HIGH (ref 4.0–10.5)
nRBC: 0 % (ref 0.0–0.2)

## 2022-09-05 LAB — BASIC METABOLIC PANEL
Anion gap: 11 (ref 5–15)
BUN: 15 mg/dL (ref 8–23)
CO2: 19 mmol/L — ABNORMAL LOW (ref 22–32)
Calcium: 8 mg/dL — ABNORMAL LOW (ref 8.9–10.3)
Chloride: 101 mmol/L (ref 98–111)
Creatinine, Ser: 0.6 mg/dL (ref 0.44–1.00)
GFR, Estimated: >60 mL/min (ref >60)
Glucose, Bld: 100 mg/dL — ABNORMAL HIGH (ref 70–99)
Potassium: 3.8 mmol/L (ref 3.5–5.1)
Sodium: 131 mmol/L — ABNORMAL LOW (ref 135–145)

## 2022-09-05 LAB — MAGNESIUM: Magnesium: 1.6 mg/dL — ABNORMAL LOW (ref 1.7–2.4)

## 2022-09-05 LAB — HEPARIN LEVEL (UNFRACTIONATED): Heparin Unfractionated: 0.31 IU/mL (ref 0.30–0.70)

## 2022-09-05 MED ORDER — METOPROLOL TARTRATE 25 MG PO TABS
25.0000 mg | ORAL_TABLET | Freq: Two times a day (BID) | ORAL | Status: DC
  Administered 2022-09-05 – 2022-09-07 (×5): 25 mg via ORAL
  Filled 2022-09-05 (×5): qty 1

## 2022-09-05 MED ORDER — LEVALBUTEROL HCL 1.25 MG/0.5ML IN NEBU
1.2500 mg | INHALATION_SOLUTION | Freq: Four times a day (QID) | RESPIRATORY_TRACT | Status: DC | PRN
  Administered 2022-09-05 – 2022-09-13 (×6): 1.25 mg via RESPIRATORY_TRACT
  Filled 2022-09-05 (×10): qty 0.5

## 2022-09-05 NOTE — Progress Notes (Signed)
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  Allergies  Allergen Reactions   Succinylcholine Chloride Other (See Comments)    Prolonged paralysis Brand name Anectine   Codeine Nausea And Vomiting   Hydrocodone Nausea And Vomiting    Patient Measurements: Height: 5\' 4"  (162.6 cm) Weight: 48.7 kg (107 lb 5.8 oz) IBW/kg (Calculated) : 54.7 Heparin Dosing Weight: 48.7 kg  Vital Signs: Temp: 97.8 F (36.6 C) (01/01 0354) Temp Source: Oral (01/01 0354) BP: 136/104 (01/01 0354) Pulse Rate: 103 (01/01 0354)  Labs: Recent Labs    09/02/22 1319 09/02/22 1400 09/02/22 2121 09/03/22 0356 09/03/22 1300 09/03/22 2154 09/04/22 0655 09/04/22 1600 09/04/22 2247 09/05/22 0212  HGB  --    < >  --  9.0*  --   --  10.6*  --   --  11.1*  HCT  --    < >  --  27.2*  --   --  31.5*  --   --  33.3*  PLT  --    < >  --  596*  --   --  777*  --   --  874*  LABPROT 13.1  --  15.3*  --   --   --   --   --   --   --   INR 1.0  --  1.2  --   --   --   --   --   --   --   HEPARINUNFRC  --    < > 0.44 <0.10*   < > 0.25*  --   --  0.49 0.31  CREATININE 0.58  --   --   --   --   --   --  0.64  --   --    < > = values in this interval not displayed.     Estimated Creatinine Clearance: 38.1 mL/min (by C-G formula based on SCr of 0.64 mg/dL).   Medications:  Scheduled:   benzonatate  200 mg Oral TID   folic acid  1 mg Oral Daily   metoprolol tartrate  25 mg Oral BID   pneumococcal 20-valent conjugate vaccine  0.5 mL Intramuscular Tomorrow-1000   saccharomyces boulardii  250 mg Oral BID   sodium chloride flush  10 mL Intrapleural Q8H   Infusions:   ceFEPime (MAXIPIME) IV 2 g (09/05/22 0056)   heparin 1,100 Units/hr (09/05/22 0212)   vancomycin Stopped (09/04/22 1848)    Assessment: 54 YOF presenting with difficulty breathing, previous thoracentesis 12/26. PMH significant for HTN,  paroxysmal Afib (no longer on Eliquis), diagnosed with stage IV adenocarcinoma of right lung in November 23, malignant pleural effusions has required 4-5 thoracentesis in the last 6 weeks, inguinal hernia. 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 Pharmacy consulted to start IV heparin.   Heparin level remains therapeutic at 0.31 on 1100 units/hr for the second time. CBC remains stable and levels have increased slightly. No known issues with the line overnight or signs or symptoms of bleeding.  Goal of Therapy:  Heparin level 0.3-0.7 units/ml Monitor platelets by anticoagulation protocol: Yes   Plan:  Continue IV heparin at 1100 units/hr Daily heparin level, CBC Monitor for s/sx of bleeding   Varney Daily, PharmD PGY2 Pharmacy Resident  Please check AMION for all [facility] pharmacy phone numbers After 10:00 PM call main  pharmacy [phone]

## 2022-09-05 NOTE — Progress Notes (Addendum)
Chaplain responded to request by Gail Jennings, healthcare advocate, to facilitate communication with staff and to assist patient talk on the phone.  Pt presented as agitated and confused.  Chaplain provided ministry of presence, prayer and playing hymns, and encouraged pt to practice deep breathing and letting go of her worries, to allow herself to receive care.  Pt admits this is hard because she is used to being in charge and having many responsibilities.  Chaplain appreciates RN Sophia's care and insight. Please contact as needed for ongoing support.   Minus Liberty, [place] Pager:  [phone]    09/05/22 [phone]  Clinical Encounter Type  Visited With Patient;Other (Comment) (Health care advocate (via text/phone))  Visit Type Initial  Referral From  (Healthcare advocate)  Spiritual Encounters  Spiritual Needs Prayer;Emotional  Stress Factors  Patient Stress Factors Exhausted;Health changes

## 2022-09-05 NOTE — Progress Notes (Addendum)
Pharmacy Antibiotic Note  Gail Jennings is a 89 y.o. female admitted on 09/02/2022 with pneumonia.  Pharmacy has been consulted for Vancomycin and Cefepime dosing.  Active Problem(s): Difficulty breathing (previous thoracentesis 12/26). Patient received a chest tube on 12/31.  ID: Pneumonia based on CXR. - Afebrile, WBC 7.4. Scr <1. Vanco 12/29>> Cefepime 12/29>> Zosyn 12/29 x 1  Plan: Continue Cefepime 2g IV q 12hr Continue Vancomycin 1250 mg IV Q 48 hrs. Goal AUC 400-550. Expected AUC: 494.9, SCr used: 0.8, TBW used Continue to monitor s/sx of improvement, renal function, and de-escalate as able Follow up on LOT   Height: 5\' 4"  (162.6 cm) Weight: 48.7 kg (107 lb 5.8 oz) IBW/kg (Calculated) : 54.7  Temp (24hrs), Avg:98.1 F (36.7 C), Min:97.7 F (36.5 C), Max:98.7 F (37.1 C)  Recent Labs  Lab 09/02/22 1319 09/02/22 1400 09/03/22 0356 09/04/22 0655 09/04/22 1600 09/05/22 0212  WBC  --  7.4 6.9 8.9  --  10.6*  CREATININE 0.58  --   --   --  0.64  --      Estimated Creatinine Clearance: 38.1 mL/min (by C-G formula based on SCr of 0.64 mg/dL).    Allergies  Allergen Reactions   Succinylcholine Chloride Other (See Comments)    Prolonged paralysis Brand name Anectine   Codeine Nausea And Vomiting   Hydrocodone Nausea And Vomiting      Varney Daily, PharmD PGY2 Pharmacy Resident 09/05/2022 7:58 AM  Please check AMION for all [facility] pharmacy phone numbers After 10:00 PM call main pharmacy [phone]

## 2022-09-05 NOTE — Progress Notes (Signed)
PROGRESS NOTE    Gail Jennings  WER:[id number] DOB: [birth year] DOA: 09/02/2022 PCP: Burnard Bunting, MD   Brief Narrative: Gail Jennings is a 89 y.o. female with a history of hypertension, PAF on Eliquis, metastatic adenocarcinoma of right lung, recurrent malignant effusion. Patient presented secondary to short ness of breath with evidence of empyema/pneumonia on chest imaging. Empiric antibiotics started. Patient transferred to [facility] for cardiothoracic surgery consult who instead recommended IR for chest tube placement. Chest tube placed on 12/31.   Assessment and Plan:  Loculated pleural effusion Located in right lung. Concern for empyema on imaging. Chest tube placed by IR on 12/31. Preliminary culture data without organisms -Continue Vancomycin and Cefepime -Follow-up pleural fluid culture  Metastatic adenocarcinoma of right lung Malignant pleural effusion Patient follows with oncology, Dr. Julien Nordmann and receives low-dose chemotherapy. Treatment held secondary to acute infection.   Aortic arch mural thrombus Noted on CT chest imaging. Patient is already on anticoagulation for paroxysmal atrial fibrillation. -Continue Heparin IV  Acute respiratory failure with hypoxia Secondary to pneumonia and recurrent pleural effusion with concern for empyema. -Wean to room air as able  Paroxysmal atrial fibrillation with RVR Converted into atrial fibrillation. On metoprolol and Eliquis. Eliquis transitioned to heparin while inpatient secondary to need for procedures. Now with RVR -Increase to metoprolol 25 mg BID and continue Heparin IV while chest tube in place -Continue metoprolol IV PRN -Continue telemetry  Bronchospasm -Xopenex PRN  Primary hypertension -Continue metoprolol  Severe protein malnutrition Secondary to cancer   DVT prophylaxis: Heparin IV Code Status:   Code Status: DNR Family Communication: None at bedside Disposition Plan: Discharge  pending continued management of likely empyema   Consultants:  Interventional radiology  Procedures:  12/31: Right chest tube placement  Antimicrobials: Vancomycin Cefepime    Subjective: No significant issues overnight. Some coughing this morning after eating a muffin. Wheezing. No chest pain. Hoping to see Dr. Julien Nordmann this admission.  Objective: BP (!) 136/104 (BP Location: Right Arm)   Pulse (!) 103   Temp 97.8 F (36.6 C) (Oral)   Resp 18   Ht 5\' 4"  (1.626 m)   Wt 48.7 kg   SpO2 100%   BMI 18.43 kg/m   Examination:  General exam: Appears calm and comfortable. Cachectic. Respiratory system: Left sided wheezes with severely diminished right sided breath sounds. Respiratory effort normal. Cardiovascular system: S1 & S2 heard, irregular rhythm with fast rate. No murmurs. Gastrointestinal system: Abdomen is nondistended, soft and nontender. Normal bowel sounds heard. Central nervous system: Alert and oriented. No focal neurological deficits. Musculoskeletal: No calf tenderness Skin: No cyanosis. No rashes Psychiatry: Judgement and insight appear normal. Mood & affect appropriate.    Data Reviewed: I have personally reviewed following labs and imaging studies  CBC Lab Results  Component Value Date   WBC 10.6 (H) 09/05/2022   RBC 3.61 (L) 09/05/2022   HGB 11.1 (L) 09/05/2022   HCT 33.3 (L) 09/05/2022   MCV 92.2 09/05/2022   MCH 30.7 09/05/2022   PLT 874 (H) 09/05/2022   MCHC 33.3 09/05/2022   RDW 13.3 09/05/2022   LYMPHSABS 0.4 (L) 09/02/2022   MONOABS 1.8 (H) 09/02/2022   EOSABS 0.0 09/02/2022   BASOSABS 0.0 19/50/9326     Last metabolic panel Lab Results  Component Value Date   NA 131 (L) 09/05/2022   K 3.8 09/05/2022   CL 101 09/05/2022   CO2 19 (L) 09/05/2022   BUN 15 09/05/2022   CREATININE 0.60  09/05/2022   GLUCOSE 100 (H) 09/05/2022   GFRNONAA >60 09/05/2022   GFRAA >60 08/16/2019   CALCIUM 8.0 (L) 09/05/2022   PROT 6.1 (L) 09/02/2022    ALBUMIN 2.2 (L) 09/02/2022   BILITOT 1.0 09/02/2022   ALKPHOS 75 09/02/2022   AST 20 09/02/2022   ALT 13 09/02/2022   ANIONGAP 11 09/05/2022    GFR: Estimated Creatinine Clearance: 38.1 mL/min (by C-G formula based on SCr of 0.6 mg/dL).  Recent Results (from the past 240 hour(s))  Resp panel by RT-PCR (RSV, Flu A&B, Covid) Anterior Nasal Swab     Status: None   Collection Time: 09/02/22  1:22 PM   Specimen: Anterior Nasal Swab  Result Value Ref Range Status   SARS Coronavirus 2 by RT PCR NEGATIVE NEGATIVE Final    Comment: (NOTE) SARS-CoV-2 target nucleic acids are NOT DETECTED.  The SARS-CoV-2 RNA is generally detectable in upper respiratory specimens during the acute phase of infection. The lowest concentration of SARS-CoV-2 viral copies this assay can detect is 138 copies/mL. A negative result does not preclude SARS-Cov-2 infection and should not be used as the sole basis for treatment or other patient management decisions. A negative result may occur with  improper specimen collection/handling, submission of specimen other than nasopharyngeal swab, presence of viral mutation(s) within the areas targeted by this assay, and inadequate number of viral copies(<138 copies/mL). A negative result must be combined with clinical observations, patient history, and epidemiological information. The expected result is Negative.  Fact Sheet for Patients:  EntrepreneurPulse.com.au  Fact Sheet for Healthcare Providers:  IncredibleEmployment.be  This test is no t yet approved or cleared by the [place] FDA and  has been authorized for detection and/or diagnosis of SARS-CoV-2 by FDA under an Emergency Use Authorization (EUA). This EUA will remain  in effect (meaning this test can be used) for the duration of the COVID-19 declaration under Section 564(b)(1) of the Act, 21 U.S.C.section 360bbb-3(b)(1), unless the authorization is terminated  or  revoked sooner.       Influenza A by PCR NEGATIVE NEGATIVE Final   Influenza B by PCR NEGATIVE NEGATIVE Final    Comment: (NOTE) The Xpert Xpress SARS-CoV-2/FLU/RSV plus assay is intended as an aid in the diagnosis of influenza from Nasopharyngeal swab specimens and should not be used as a sole basis for treatment. Nasal washings and aspirates are unacceptable for Xpert Xpress SARS-CoV-2/FLU/RSV testing.  Fact Sheet for Patients: EntrepreneurPulse.com.au  Fact Sheet for Healthcare Providers: IncredibleEmployment.be  This test is not yet approved or cleared by the [place] FDA and has been authorized for detection and/or diagnosis of SARS-CoV-2 by FDA under an Emergency Use Authorization (EUA). This EUA will remain in effect (meaning this test can be used) for the duration of the COVID-19 declaration under Section 564(b)(1) of the Act, 21 U.S.C. section 360bbb-3(b)(1), unless the authorization is terminated or revoked.     Resp Syncytial Virus by PCR NEGATIVE NEGATIVE Final    Comment: (NOTE) Fact Sheet for Patients: EntrepreneurPulse.com.au  Fact Sheet for Healthcare Providers: IncredibleEmployment.be  This test is not yet approved or cleared by the [place] FDA and has been authorized for detection and/or diagnosis of SARS-CoV-2 by FDA under an Emergency Use Authorization (EUA). This EUA will remain in effect (meaning this test can be used) for the duration of the COVID-19 declaration under Section 564(b)(1) of the Act, 21 U.S.C. section 360bbb-3(b)(1), unless the authorization is terminated or revoked.  Performed at [facility], [address] [place], [place] [address]   Surgical PCR screen     Status: None   Collection Time: 09/03/22  6:03 AM   Specimen: Nasal Mucosa; Nasal Swab  Result Value Ref Range Status   MRSA, PCR NEGATIVE NEGATIVE Final   Staphylococcus  aureus NEGATIVE NEGATIVE Final    Comment: (NOTE) The Xpert SA Assay (FDA approved for NASAL specimens in patients 52 years of age and older), is one component of a comprehensive surveillance program. It is not intended to diagnose infection nor to guide or monitor treatment. Performed at [facility], [place] [address], [place], [place] [address]   Aerobic/Anaerobic Culture w Gram Stain (surgical/deep wound)     Status: None (Preliminary result)   Collection Time: 09/04/22 10:44 AM   Specimen: Pleural Fluid  Result Value Ref Range Status   Specimen Description PLEURAL  Final   Special Requests RIGHT  Final   Gram Stain   Final    NO ORGANISMS SEEN NO WBC SEEN Performed at [facility], [place] [address], [place], [place] [address]    Culture PENDING  Incomplete   Report Status PENDING  Incomplete      Radiology Studies: CT [facility] PLEURAL DRAIN W/INDWELL CATH W/IMG GUIDE  Result Date: 09/04/2022 INDICATION: Loculated left effusion versus empyema EXAM: CT-GUIDED 10 FRENCH RIGHT CHEST TUBE PLACEMENT Date:  09/04/2022 09/04/2022 11:07 am Radiologist:  M. Daryll Brod, MD Guidance:  CT FLUOROSCOPY: None. MEDICATIONS: 1% lidocaine local ANESTHESIA/SEDATION: Moderate (conscious) sedation was employed during this procedure. A total of Versed 1.5 mg and Fentanyl 25 mcg was administered intravenously. Moderate Sedation Time: 11 minutes. The patient's level of consciousness and vital signs were monitored continuously by radiology nursing throughout the procedure under my direct supervision. CONTRAST:  None. COMPLICATIONS: None immediate PROCEDURE: Informed consent was obtained from the patient following explanation of the procedure, risks, benefits and alternatives. The patient understands, agrees and consents for the procedure. All questions were addressed. A time out was performed. Maximal barrier sterile technique utilized including caps, mask, sterile gowns, sterile gloves, large sterile  drape, hand hygiene, and ChloraPrep. Previous imaging reviewed. Patient position right anterior oblique. Noncontrast localization CT performed. The lower portion of the large right loculated effusion was localized and marked for access. Under sterile conditions and local anesthesia, an 18 gauge 10 cm access needle was advanced from a lower intercostal approach into the fluid collection. Needle position confirmed with CT. Syringe aspiration yielded amber colored serosanguineous fluid. Sample sent for culture and laboratory analysis. Guidewire inserted followed by tract dilatation to insert a 10 [place] drain. Drain catheter position confirmed with additional CT. Catheter secured with a silk suture and connected to external pleura vac. Sterile dressing applied. No immediate complication. Patient tolerated procedure well. IMPRESSION: Successful CT-guided 10 French right chest tube placement. Electronically Signed   By: Jerilynn Mages.  Shick M.D.   On: 09/04/2022 14:00      LOS: 3 days    Cordelia Poche, MD [facility] 09/05/2022, 9:23 AM   If 7PM-7AM, please contact night-coverage [email or web address]

## 2022-09-05 NOTE — Care Management Important Message (Signed)
Important Message  Patient Details  Name: Gail Jennings MRN: [id number] Date of Birth: [birth year]   Medicare Important Message Given:  Yes     Hannah Beat 09/05/2022, 2:05 PM

## 2022-09-06 ENCOUNTER — Inpatient Hospital Stay (HOSPITAL_COMMUNITY): Payer: Medicare Other

## 2022-09-06 ENCOUNTER — Ambulatory Visit (HOSPITAL_COMMUNITY): Payer: Medicare Other

## 2022-09-06 ENCOUNTER — Inpatient Hospital Stay (HOSPITAL_COMMUNITY): Admission: RE | Admit: 2022-09-06 | Payer: Medicare Other | Source: Ambulatory Visit

## 2022-09-06 ENCOUNTER — Encounter: Payer: Self-pay | Admitting: Internal Medicine

## 2022-09-06 ENCOUNTER — Telehealth: Payer: Self-pay | Admitting: Medical Oncology

## 2022-09-06 DIAGNOSIS — C3491 Malignant neoplasm of unspecified part of right bronchus or lung: Secondary | ICD-10-CM | POA: Diagnosis not present

## 2022-09-06 DIAGNOSIS — I10 Essential (primary) hypertension: Secondary | ICD-10-CM | POA: Diagnosis not present

## 2022-09-06 DIAGNOSIS — J869 Pyothorax without fistula: Secondary | ICD-10-CM | POA: Diagnosis not present

## 2022-09-06 LAB — CBC
HCT: 32 % — ABNORMAL LOW (ref 36.0–46.0)
Hemoglobin: 10.7 g/dL — ABNORMAL LOW (ref 12.0–15.0)
MCH: 30.7 pg (ref 26.0–34.0)
MCHC: 33.4 g/dL (ref 30.0–36.0)
MCV: 92 fL (ref 80.0–100.0)
Platelets: 823 10*3/uL — ABNORMAL HIGH (ref 150–400)
RBC: 3.48 MIL/uL — ABNORMAL LOW (ref 3.87–5.11)
RDW: 13.4 % (ref 11.5–15.5)
WBC: 13.3 10*3/uL — ABNORMAL HIGH (ref 4.0–10.5)
nRBC: 0 % (ref 0.0–0.2)

## 2022-09-06 LAB — HEPARIN LEVEL (UNFRACTIONATED): Heparin Unfractionated: 0.42 IU/mL (ref 0.30–0.70)

## 2022-09-06 NOTE — Progress Notes (Signed)
ANTICOAGULATION CONSULT NOTE  Pharmacy Consult for Heparin Indication: atrial fibrillation and aortic arch mural thrombus  Allergies  Allergen Reactions   Succinylcholine Chloride Other (See Comments)    Prolonged paralysis Brand name Anectine   Codeine Nausea And Vomiting   Hydrocodone Nausea And Vomiting    Patient Measurements: Height: 5\' 4"  (162.6 cm) Weight: 48.7 kg (107 lb 5.8 oz) IBW/kg (Calculated) : 54.7  Heparin Dosing Weight: 49 kg  Vital Signs: Temp: 97.3 F (36.3 C) (01/02 0753) Temp Source: Oral (01/02 0753) BP: 135/86 (01/02 0753) Pulse Rate: 100 (01/02 0753)  Labs: Recent Labs    09/04/22 0655 09/04/22 1600 09/04/22 2247 09/05/22 0212 09/06/22 0313  HGB 10.6*  --   --  11.1* 10.7*  HCT 31.5*  --   --  33.3* 32.0*  PLT 777*  --   --  874* 823*  HEPARINUNFRC  --   --  0.49 0.31 0.42  CREATININE  --  0.64  --  0.60  --     Estimated Creatinine Clearance: 38.1 mL/min (by C-G formula based on SCr of 0.6 mg/dL).   Assessment: 55 YOF with medical history significant for paroxysmal Afib (no longer on Eliquis) and recently diagnosed stage IV adenocarcinoma of right lung who was found to have a mural thrombus at the aortic arch. Pharmacy consulted to manage heparin.  Heparin level therapeutic at 0.42 on 1100 units/hr. No issues with heparin infusion or bleeding reported. CBC stable, platelets elevated but stable at 823.  Goal of Therapy:  Heparin level 0.3-0.7 units/ml Monitor platelets by anticoagulation protocol: Yes   Plan:  Continue heparin infusion at 1100 units/hr Check heparin level daily while on heparin Continue to monitor H&H and platelets    Thank you for allowing pharmacy to be a part of this patient's care.  Ardyth Harps, PharmD Clinical Pharmacist

## 2022-09-06 NOTE — Progress Notes (Signed)
PROGRESS NOTE    Gail Jennings  FYB:[id number] DOB: 14-Sep-1932 DOA: 09/02/2022 PCP: Burnard Bunting, MD   Brief Narrative: Gail Jennings is a 89 y.o. female with a history of hypertension, PAF on Eliquis, metastatic adenocarcinoma of right lung, recurrent malignant effusion. Patient presented secondary to short ness of breath with evidence of empyema/pneumonia on chest imaging. Empiric antibiotics started. Patient transferred to [facility] for cardiothoracic surgery consult who instead recommended IR for chest tube placement. Chest tube placed on 12/31.   Assessment and Plan:  Loculated pleural effusion Located in right lung. Concern for empyema on imaging. Chest tube placed by IR on 12/31 and is draining serosanguinous fluid. Preliminary culture data without organisms on gram stain and no growth on culture.  -Follow-up pleural fluid culture  Right middle/lower lobe pneumonia Noted on imaging. Associated pleural fluid concerning for possible empyema. Patient started empirically on Vancomycin/Cefepime -Continue Vancomycin and Cefepime  Metastatic adenocarcinoma of right lung Malignant pleural effusion Patient follows with oncology, Dr. Julien Nordmann and receives low-dose chemotherapy. Treatment held secondary to acute infection.  Aortic arch mural thrombus Noted on CT chest imaging. Patient is already on anticoagulation for paroxysmal atrial fibrillation. -Continue Heparin IV, switch back to Eliquis once chest tube is out (will likely need to be on standard dosing)  Acute respiratory failure with hypoxia Secondary to pneumonia and recurrent pleural effusion with concern for empyema. -Wean to room air as able -PT/OT eval  Paroxysmal atrial fibrillation with RVR Converted into atrial fibrillation. On metoprolol and Eliquis. Eliquis transitioned to heparin while inpatient secondary to need for procedures. Now with RVR -Continue increased metoprolol 25 mg BID and  continue Heparin IV while chest tube in place -Continue metoprolol IV PRN -Continue telemetry  Thrombocytosis Likely reactive and secondary to acute infection. Trending down.  Bronchospasm -Xopenex PRN  Primary hypertension -Continue metoprolol  Severe protein malnutrition Secondary to cancer   DVT prophylaxis: Heparin IV Code Status:   Code Status: DNR Family Communication: None at bedside Disposition Plan: Discharge pending continued management of likely empyema   Consultants:  Interventional radiology  Procedures:  12/31: Right chest tube placement  Antimicrobials: Vancomycin Cefepime    Subjective: Patient reports feeling confused the last day. Some coughing. No other concerns from overnight.  Objective: BP (!) 116/90 (BP Location: Right Arm)   Pulse (!) 124   Temp (!) 97.3 F (36.3 C) (Oral)   Resp 19   Ht 5\' 4"  (1.626 m)   Wt 48.7 kg   SpO2 100%   BMI 18.43 kg/m   Examination:  General exam: Appears calm and comfortable. Cachectic. Respiratory system: Clear to auscultation without wheezing bilaterally. Diminished breath sounds in RLL area. Respiratory effort normal. Cardiovascular system: S1 & S2 heard, irregular rhythm with fast rate. Gastrointestinal system: Abdomen is nondistended, soft and nontender. Normal bowel sounds heard. Central nervous system: Alert and oriented. Musculoskeletal: 1+ BLE edema. No calf tenderness   Data Reviewed: I have personally reviewed following labs and imaging studies  CBC Lab Results  Component Value Date   WBC 13.3 (H) 09/06/2022   RBC 3.48 (L) 09/06/2022   HGB 10.7 (L) 09/06/2022   HCT 32.0 (L) 09/06/2022   MCV 92.0 09/06/2022   MCH 30.7 09/06/2022   PLT 823 (H) 09/06/2022   MCHC 33.4 09/06/2022   RDW 13.4 09/06/2022   LYMPHSABS 0.4 (L) 09/02/2022   MONOABS 1.8 (H) 09/02/2022   EOSABS 0.0 09/02/2022   BASOSABS 0.0 52/77/8242     Last metabolic  panel Lab Results  Component Value Date   NA 131  (L) 09/05/2022   K 3.8 09/05/2022   CL 101 09/05/2022   CO2 19 (L) 09/05/2022   BUN 15 09/05/2022   CREATININE 0.60 09/05/2022   GLUCOSE 100 (H) 09/05/2022   GFRNONAA >60 09/05/2022   GFRAA >60 08/16/2019   CALCIUM 8.0 (L) 09/05/2022   PROT 6.1 (L) 09/02/2022   ALBUMIN 2.2 (L) 09/02/2022   BILITOT 1.0 09/02/2022   ALKPHOS 75 09/02/2022   AST 20 09/02/2022   ALT 13 09/02/2022   ANIONGAP 11 09/05/2022    GFR: Estimated Creatinine Clearance: 38.1 mL/min (by C-G formula based on SCr of 0.6 mg/dL).  Recent Results (from the past 240 hour(s))  Resp panel by RT-PCR (RSV, Flu A&B, Covid) Anterior Nasal Swab     Status: None   Collection Time: 09/02/22  1:22 PM   Specimen: Anterior Nasal Swab  Result Value Ref Range Status   SARS Coronavirus 2 by RT PCR NEGATIVE NEGATIVE Final    Comment: (NOTE) SARS-CoV-2 target nucleic acids are NOT DETECTED.  The SARS-CoV-2 RNA is generally detectable in upper respiratory specimens during the acute phase of infection. The lowest concentration of SARS-CoV-2 viral copies this assay can detect is 138 copies/mL. A negative result does not preclude SARS-Cov-2 infection and should not be used as the sole basis for treatment or other patient management decisions. A negative result may occur with  improper specimen collection/handling, submission of specimen other than nasopharyngeal swab, presence of viral mutation(s) within the areas targeted by this assay, and inadequate number of viral copies(<138 copies/mL). A negative result must be combined with clinical observations, patient history, and epidemiological information. The expected result is Negative.  Fact Sheet for Patients:  EntrepreneurPulse.com.au  Fact Sheet for Healthcare Providers:  IncredibleEmployment.be  This test is no t yet approved or cleared by the [place] FDA and  has been authorized for detection and/or diagnosis of SARS-CoV-2 by FDA  under an Emergency Use Authorization (EUA). This EUA will remain  in effect (meaning this test can be used) for the duration of the COVID-19 declaration under Section 564(b)(1) of the Act, 21 U.S.C.section 360bbb-3(b)(1), unless the authorization is terminated  or revoked sooner.       Influenza A by PCR NEGATIVE NEGATIVE Final   Influenza B by PCR NEGATIVE NEGATIVE Final    Comment: (NOTE) The Xpert Xpress SARS-CoV-2/FLU/RSV plus assay is intended as an aid in the diagnosis of influenza from Nasopharyngeal swab specimens and should not be used as a sole basis for treatment. Nasal washings and aspirates are unacceptable for Xpert Xpress SARS-CoV-2/FLU/RSV testing.  Fact Sheet for Patients: EntrepreneurPulse.com.au  Fact Sheet for Healthcare Providers: IncredibleEmployment.be  This test is not yet approved or cleared by the [place] FDA and has been authorized for detection and/or diagnosis of SARS-CoV-2 by FDA under an Emergency Use Authorization (EUA). This EUA will remain in effect (meaning this test can be used) for the duration of the COVID-19 declaration under Section 564(b)(1) of the Act, 21 U.S.C. section 360bbb-3(b)(1), unless the authorization is terminated or revoked.     Resp Syncytial Virus by PCR NEGATIVE NEGATIVE Final    Comment: (NOTE) Fact Sheet for Patients: EntrepreneurPulse.com.au  Fact Sheet for Healthcare Providers: IncredibleEmployment.be  This test is not yet approved or cleared by the [place] FDA and has been authorized for detection and/or diagnosis of SARS-CoV-2 by FDA under an Emergency Use Authorization (EUA). This EUA will remain in effect (  meaning this test can be used) for the duration of the COVID-19 declaration under Section 564(b)(1) of the Act, 21 U.S.C. section 360bbb-3(b)(1), unless the authorization is terminated or revoked.  Performed at [facility], [place] [address], [place], [place] [address]   Surgical PCR screen     Status: None   Collection Time: 09/03/22  6:03 AM   Specimen: Nasal Mucosa; Nasal Swab  Result Value Ref Range Status   MRSA, PCR NEGATIVE NEGATIVE Final   Staphylococcus aureus NEGATIVE NEGATIVE Final    Comment: (NOTE) The Xpert SA Assay (FDA approved for NASAL specimens in patients 63 years of age and older), is one component of a comprehensive surveillance program. It is not intended to diagnose infection nor to guide or monitor treatment. Performed at [facility], [place] [address], [place], [place] [address]   Aerobic/Anaerobic Culture w Gram Stain (surgical/deep wound)     Status: None (Preliminary result)   Collection Time: 09/04/22 10:44 AM   Specimen: Pleural Fluid  Result Value Ref Range Status   Specimen Description PLEURAL  Final   Special Requests RIGHT  Final   Gram Stain NO ORGANISMS SEEN NO WBC SEEN   Final   Culture   Final    NO GROWTH 2 DAYS Performed at [facility], [place] [address] [place], [place] [address]    Report Status PENDING  Incomplete      Radiology Studies: DG CHEST PORT 1 VIEW  Result Date: 09/06/2022 CLINICAL DATA:  Right pleural effusion EXAM: PORTABLE CHEST 1 VIEW COMPARISON:  Previous studies including the examination of 09/02/2022 FINDINGS: There is interval placement of right chest tube. There is improved aeration in right lower lung fields suggesting decrease in pleural effusion. Increased interstitial markings are seen in right mid and right lower lung fields. There is poor inspiration. There is no pneumothorax. IMPRESSION: There is interval decrease in right pleural effusion after placement of right chest tube. Still, there is a small to moderate residual right pleural effusion. Infiltrate in the right mid and right lower lung fields may suggest pneumonia. There is no pneumothorax. Electronically Signed   By: Elmer Picker M.D.   On:  09/06/2022 13:12      LOS: 4 days    Cordelia Poche, MD [facility] 09/06/2022, 3:17 PM   If 7PM-7AM, please contact night-coverage [email or web address]

## 2022-09-06 NOTE — Care Management (Signed)
Notified attending that patient is requesting palliative consult.   TOC will continue to follow through progression rounds and is available to assist coordinating DC needs. Please place consult as needed.

## 2022-09-06 NOTE — Progress Notes (Signed)
Referring Physician(s): Dr Lonny Prude  Supervising Physician: Markus Daft  Patient Status:  [facility] - In-pt  Chief Complaint:  Complex Rt pleural effusion  Subjective:  Pt up in bed Not comfortable Rt chest tube drain placed 12/31 10 Fr drain--- to Pleurvac 100 cc in vac; blood tinged fluid No air leak  CXR result pending today  Allergies: Succinylcholine chloride, Codeine, and Hydrocodone  Medications: Prior to Admission medications   Medication Sig Start Date End Date Taking? Authorizing Provider  folic acid (FOLVITE) 1 MG tablet Take 1 tablet (1 mg total) by mouth daily. 08/10/22  Yes Heilingoetter, Cassandra L, PA-C  metoprolol tartrate (LOPRESSOR) 25 MG tablet Take 0.5 tablets (12.5 mg total) by mouth 2 (two) times daily. 07/17/22  Yes [name], MD  ondansetron (ZOFRAN) 8 MG tablet Take 1 tablet (8 mg total) by mouth every 8 (eight) hours as needed for nausea or vomiting. Start on day 3 after carboplatin. 08/16/22  Yes Curt Bears, MD  prochlorperazine (COMPAZINE) 10 MG tablet Take 1 tablet (10 mg total) by mouth every 6 (six) hours as needed for nausea or vomiting. 08/16/22  Yes Curt Bears, MD  apixaban (ELIQUIS) 2.5 MG TABS tablet Take 1 tablet (2.5 mg total) by mouth 2 (two) times daily. Patient not taking: Reported on 09/02/2022 07/17/22   [name], MD     Vital Signs: BP (!) 116/90 (BP Location: Right Arm)   Pulse (!) 124   Temp (!) 97.3 F (36.3 C) (Oral)   Resp 19   Ht 5\' 4"  (1.626 m)   Wt 107 lb 5.8 oz (48.7 kg)   SpO2 100%   BMI 18.43 kg/m   Physical Exam Vitals reviewed.  Skin:    General: Skin is warm.     Comments: Site of Rt chest tube clean and dry NT no bleeding No air leak at Pleur vac 1000 cc blood tinged fluid in vac  Component 2 d ago Specimen Description PLEURAL Special Requests RIGHT Gram Stain NO ORGANISMS SEEN NO WBC SEEN  CXR pending  Neurological:     Mental Status: She is alert.     Imaging: CT [facility]  PLEURAL DRAIN W/INDWELL CATH W/IMG GUIDE  Result Date: 09/04/2022 INDICATION: Loculated left effusion versus empyema EXAM: CT-GUIDED 10 FRENCH RIGHT CHEST TUBE PLACEMENT Date:  09/04/2022 09/04/2022 11:07 am Radiologist:  M. Daryll Brod, MD Guidance:  CT FLUOROSCOPY: None. MEDICATIONS: 1% lidocaine local ANESTHESIA/SEDATION: Moderate (conscious) sedation was employed during this procedure. A total of Versed 1.5 mg and Fentanyl 25 mcg was administered intravenously. Moderate Sedation Time: 11 minutes. The patient's level of consciousness and vital signs were monitored continuously by radiology nursing throughout the procedure under my direct supervision. CONTRAST:  None. COMPLICATIONS: None immediate PROCEDURE: Informed consent was obtained from the patient following explanation of the procedure, risks, benefits and alternatives. The patient understands, agrees and consents for the procedure. All questions were addressed. A time out was performed. Maximal barrier sterile technique utilized including caps, mask, sterile gowns, sterile gloves, large sterile drape, hand hygiene, and ChloraPrep. Previous imaging reviewed. Patient position right anterior oblique. Noncontrast localization CT performed. The lower portion of the large right loculated effusion was localized and marked for access. Under sterile conditions and local anesthesia, an 18 gauge 10 cm access needle was advanced from a lower intercostal approach into the fluid collection. Needle position confirmed with CT. Syringe aspiration yielded amber colored serosanguineous fluid. Sample sent for culture and laboratory analysis. Guidewire inserted followed by  tract dilatation to insert a 10 [place] drain. Drain catheter position confirmed with additional CT. Catheter secured with a silk suture and connected to external pleura vac. Sterile dressing applied. No immediate complication. Patient tolerated procedure well. IMPRESSION: Successful CT-guided 10 French  right chest tube placement. Electronically Signed   By: Jerilynn Mages.  Shick M.D.   On: 09/04/2022 14:00   [place] CHEST (PLEURAL EFFUSION)  Result Date: 09/02/2022 CLINICAL DATA:  89 year old female with recent diagnosis lung cancer recurrent right pleural effusion. The patient presents to the emergency department shortness of breath. EXAM: CHEST ULTRASOUND COMPARISON:  08/09/2022, 08/15/2022, 08/24/2022, 08/30/2022 FINDINGS: Highly complex large right pleural effusion. Multiple echo it septations are present. Debris is visualized within multiple larger fluid pockets. IMPRESSION: Complex, septated right pleural effusion. No thoracentesis was performed at this time due to increased degree of complexity. Recommend attention on forthcoming chest CT. Ruthann Cancer, MD [facility] Electronically Signed   By: Ruthann Cancer M.D.   On: 09/02/2022 16:07   CT Chest W Contrast  Result Date: 09/02/2022 CLINICAL DATA:  Pleural effusion shortness of breath EXAM: CT CHEST WITH CONTRAST TECHNIQUE: Multidetector CT imaging of the chest was performed during intravenous contrast administration. RADIATION DOSE REDUCTION: This exam was performed according to the departmental dose-optimization program which includes automated exposure control, adjustment of the mA and/or kV according to patient size and/or use of iterative reconstruction technique. CONTRAST:  60mL OMNIPAQUE IOHEXOL 300 MG/ML  SOLN COMPARISON:  Chest x-ray 09/02/2022, PET CT 07/29/2022, chest CT 07/12/2022 FINDINGS: Cardiovascular: Moderate aortic atherosclerosis. No aneurysm. Small volume irregular free-floating mural thrombus at the aortic arch, series 2, image 34. Normal cardiac size. No pericardial effusion. Mediastinum/Nodes: Midline trachea. No thyroid mass. Mild mediastinal and right hilar nodes. Right hilar node measures 14 mm, series 2, image 67. Fluid distension of the esophagus. Lungs/Pleura: Circumferential  right pleural thickening raising concern for pleural metastatic disease. Moderate loculated right pleural effusion with interval gas bubbles. Consolidation/mass at the right middle and lower lobes with additional ground-glass density in the right upper and middle lobes which could be due to superimposed infection. Emphysema. Upper Abdomen: Redemonstrated incompletely visualized fluid collection posterior to the right hepatic lobe, this measures 3.9 cm compared with 3.9 cm previously. Musculoskeletal: No acute osseous abnormality. Mild rim enhancing fluid collection within the right posterior chest wall, measuring approximately 6.2 x 1 cm, series 2, image 98, question communication with pleural space at the right 9-10 intercostal space. IMPRESSION: 1. Circumferential right pleural thickening raising concern for pleural metastatic disease. Moderate loculated right pleural effusion with interval gas bubbles, suspect for empyema. Consolidation/mass at the right middle and lower lobes slightly progressive compared to the prior PET CT. Additional ground-glass densities in the right upper and middle lobes potentially due to acute infection. 2. Rim enhancing fluid collection within the right posterior chest wall measuring approximately 6.2 x 1 cm, question communication with pleural space at the right 9-10 intercostal space. 3. Small volume irregular free-floating mural thrombus at the aortic arch, increased risk for thromboembolic phenomenon. 4. Emphysema. 5. Redemonstrated incompletely visualized fluid collection posterior to the right hepatic lobe measuring 3.9 cm. 6. Aortic atherosclerosis. Aortic Atherosclerosis (ICD10-I70.0) and Emphysema (ICD10-J43.9). Electronically Signed   By: Donavan Foil M.D.   On: 09/02/2022 15:57   DG Chest 2 View  Result Date: 09/02/2022 CLINICAL DATA:  Shortness of breath. EXAM: CHEST - 2 VIEW COMPARISON:  August 30, 2022. FINDINGS: The heart size and mediastinal contours are within  normal limits. Left lung is unremarkable. There appears to be a large loculated hydropneumothorax or possible empyema seen posteriorly in right hemithorax. Right basilar atelectasis or infiltrate is noted. The visualized skeletal structures are unremarkable. IMPRESSION: Probable large loculated right-sided hydropneumothorax or possible empyema is noted posteriorly. CT scan with intravenous contrast is recommended for further evaluation. Electronically Signed   By: Marijo Conception M.D.   On: 09/02/2022 13:05    Labs:  CBC: Recent Labs    09/03/22 0356 09/04/22 0655 09/05/22 0212 09/06/22 0313  WBC 6.9 8.9 10.6* 13.3*  HGB 9.0* 10.6* 11.1* 10.7*  HCT 27.2* 31.5* 33.3* 32.0*  PLT 596* 777* 874* 823*    COAGS: Recent Labs    09/02/22 1319 09/02/22 2121  INR 1.0 1.2    BMP: Recent Labs    08/23/22 0813 09/02/22 1319 09/04/22 1600 09/05/22 0212  NA 132* 130* 131* 131*  K 3.7 4.7 3.9 3.8  CL 99 95* 100 101  CO2 25 24 18* 19*  GLUCOSE 138* 110* 109* 100*  BUN 15 16 15 15   CALCIUM 8.1* 8.5* 8.1* 8.0*  CREATININE 0.54 0.58 0.64 0.60  GFRNONAA >60 >60 >60 >60    LIVER FUNCTION TESTS: Recent Labs    08/02/22 1029 08/17/22 1320 08/23/22 0813 09/02/22 1319  BILITOT 0.4 0.3 0.3 1.0  AST 19 11* 16 20  ALT 15 9 14 13   ALKPHOS 137* 96 85 75  PROT 6.3* 5.2* 5.6* 6.1*  ALBUMIN 3.5 2.8* 1.8* 2.2*    Assessment and Plan:  Rt chest tube in place Will follow  Electronically Signed: Lavonia Drafts, PA-C 09/06/2022, 12:00 PM   I spent a total of 15 Minutes at the the patient's bedside AND on the patient's hospital floor or unit, greater than 50% of which was counseling/coordinating care for rt chest tube drain

## 2022-09-06 NOTE — Progress Notes (Signed)
Linda snow asking for update on patient. Message sent to DR nettey

## 2022-09-06 NOTE — Progress Notes (Signed)
Tele called and notified charge the pt is having RVR, RN paged MD waiting for call back. RN went in the room to check on pt she stated she was fine just woke up.

## 2022-09-06 NOTE — Telephone Encounter (Signed)
Asking for plan of care and appts.  I told Gail Jennings that Gail Jennings's plan and appts will be updated based on her hospital progress.  Gail Jennings is asking for palliative care for pt . I told her to ask for palliative care referral and care management to visit pt. Marland Kitchen

## 2022-09-07 ENCOUNTER — Inpatient Hospital Stay (HOSPITAL_COMMUNITY): Payer: Medicare Other

## 2022-09-07 ENCOUNTER — Telehealth: Payer: Self-pay | Admitting: Medical Oncology

## 2022-09-07 ENCOUNTER — Encounter: Payer: Self-pay | Admitting: Internal Medicine

## 2022-09-07 DIAGNOSIS — Z7189 Other specified counseling: Secondary | ICD-10-CM | POA: Diagnosis not present

## 2022-09-07 DIAGNOSIS — J910 Malignant pleural effusion: Secondary | ICD-10-CM | POA: Diagnosis not present

## 2022-09-07 DIAGNOSIS — J869 Pyothorax without fistula: Secondary | ICD-10-CM | POA: Diagnosis not present

## 2022-09-07 DIAGNOSIS — Z515 Encounter for palliative care: Secondary | ICD-10-CM | POA: Diagnosis not present

## 2022-09-07 DIAGNOSIS — C3491 Malignant neoplasm of unspecified part of right bronchus or lung: Secondary | ICD-10-CM | POA: Diagnosis not present

## 2022-09-07 DIAGNOSIS — F419 Anxiety disorder, unspecified: Secondary | ICD-10-CM

## 2022-09-07 LAB — CBC
HCT: 33.1 % — ABNORMAL LOW (ref 36.0–46.0)
Hemoglobin: 10.7 g/dL — ABNORMAL LOW (ref 12.0–15.0)
MCH: 30.7 pg (ref 26.0–34.0)
MCHC: 32.3 g/dL (ref 30.0–36.0)
MCV: 95.1 fL (ref 80.0–100.0)
Platelets: 882 10*3/uL — ABNORMAL HIGH (ref 150–400)
RBC: 3.48 MIL/uL — ABNORMAL LOW (ref 3.87–5.11)
RDW: 13.8 % (ref 11.5–15.5)
WBC: 15.6 10*3/uL — ABNORMAL HIGH (ref 4.0–10.5)
nRBC: 0 % (ref 0.0–0.2)

## 2022-09-07 LAB — HEPARIN LEVEL (UNFRACTIONATED)
Heparin Unfractionated: 0.19 IU/mL — ABNORMAL LOW (ref 0.30–0.70)
Heparin Unfractionated: 0.59 IU/mL (ref 0.30–0.70)

## 2022-09-07 MED ORDER — SODIUM CHLORIDE 0.9 % IV SOLN
3.0000 g | Freq: Four times a day (QID) | INTRAVENOUS | Status: DC
  Administered 2022-09-07 – 2022-09-08 (×4): 3 g via INTRAVENOUS
  Filled 2022-09-07 (×4): qty 8

## 2022-09-07 MED ORDER — SENNOSIDES-DOCUSATE SODIUM 8.6-50 MG PO TABS
2.0000 | ORAL_TABLET | Freq: Two times a day (BID) | ORAL | Status: DC
  Administered 2022-09-07 – 2022-09-14 (×15): 2 via ORAL
  Filled 2022-09-07 (×17): qty 2

## 2022-09-07 MED ORDER — METOPROLOL TARTRATE 25 MG PO TABS
37.5000 mg | ORAL_TABLET | Freq: Two times a day (BID) | ORAL | Status: DC
  Administered 2022-09-07 – 2022-09-15 (×14): 37.5 mg via ORAL
  Filled 2022-09-07 (×17): qty 1

## 2022-09-07 MED ORDER — HYDROXYZINE HCL 10 MG/5ML PO SYRP
10.0000 mg | ORAL_SOLUTION | Freq: Three times a day (TID) | ORAL | Status: DC | PRN
  Administered 2022-09-12 – 2022-09-16 (×7): 10 mg via ORAL
  Filled 2022-09-07 (×12): qty 5

## 2022-09-07 MED ORDER — STERILE WATER FOR INJECTION IJ SOLN
5.0000 mg | Freq: Once | RESPIRATORY_TRACT | Status: DC
  Filled 2022-09-07: qty 5

## 2022-09-07 MED ORDER — SODIUM CHLORIDE (PF) 0.9 % IJ SOLN
10.0000 mg | Freq: Once | INTRAMUSCULAR | Status: DC
  Filled 2022-09-07: qty 10

## 2022-09-07 MED FILL — Dexamethasone Sodium Phosphate Inj 100 MG/10ML: INTRAMUSCULAR | Qty: 1 | Status: AC

## 2022-09-07 MED FILL — Fosaprepitant Dimeglumine For IV Infusion 150 MG (Base Eq): INTRAVENOUS | Qty: 5 | Status: AC

## 2022-09-07 NOTE — Progress Notes (Signed)
PROGRESS NOTE  Gail Jennings QMV:[id number] DOB: [birth year] DOA: 09/02/2022 PCP: Burnard Bunting, MD   LOS: 5 days   Brief Narrative / Interim history: 89 year old female with history of HTN, PAF on Eliquis, metastatic adenocarcinoma of the right lung with recurrent malignant effusion comes into the hospital with shortness of breath, right middle lower lobe pneumonia as well as loculated pleural effusion on the right.  She presented to [facility] and transferred to [facility] for cardiothoracic surgery evaluation, but upon arrival to [facility] this was deferred to IR who placed the chest tube on 12/31.  Unfortunately cell count was not sent and cultures are pending  Subjective / 24h Interval events: She is feeling miserable this morning, she does not like the food as she feels it is very unhealthy, she needs someone to help her wash, she is uncomfortable at the chest tube site.  Assesement and Plan: Principal Problem:   Empyema ([place]) Active Problems:   Essential hypertension   Primary lung adenocarcinoma, right ([place])   Goals of care, counseling/discussion  Principal problem Loculated pleural effusion, status post chest tube - this was placed by IR on 12/31, following.  Likely needs daily chest x-rays, defer to IR, also timing for chest tube removal.  I am wondering whether she needs lytic therapy, also defer to interventional radiology, appreciate daily follow-up -Unfortunately cell count was not sent, I do not see it resulted, cultures are pending  Active problems Right middle and lower lobe pneumonia-noted on imaging, continue broad-spectrum antibiotics with vancomycin and cefepime.  Given complicated effusion ID consulted  Metastatic adenocarcinoma of the right lung, malignant pleural effusion-follows with Dr. Julien Nordmann as an outpatient.  Treatment is on hold due to acute infection.  I have consulted palliative care as overall she has a very poor prognosis with stage IV  cancer and active infection  Aortic arch moral thrombus-she is already on anticoagulation for PAF, currently on heparin while chest tube is in place  Acute hypoxic respiratory failure-due to pneumonia, recurrent pleural effusion  PAF with RVR-anticoagulated with heparin, increase metoprolol due to persistent elevation of her heart rate  Thrombocytosis-likely reactive  Severe protein malnutrition-BMI 18, due to malignancy  Essential hypertension-continue metoprolol  Scheduled Meds:  benzonatate  200 mg Oral TID   folic acid  1 mg Oral Daily   metoprolol tartrate  25 mg Oral BID   pneumococcal 20-valent conjugate vaccine  0.5 mL Intramuscular Tomorrow-1000   senna-docusate  2 tablet Oral BID   sodium chloride flush  10 mL Intrapleural Q8H   Continuous Infusions:  ampicillin-sulbactam (UNASYN) IV     heparin 1,100 Units/hr (09/07/22 0053)   PRN Meds:.hydrOXYzine, levalbuterol, melatonin, metoprolol tartrate, prochlorperazine  Current Outpatient Medications  Medication Instructions   apixaban (ELIQUIS) 2.5 mg, Oral, 2 times daily   folic acid (FOLVITE) 1 mg, Oral, Daily   metoprolol tartrate (LOPRESSOR) 12.5 mg, Oral, 2 times daily   ondansetron (ZOFRAN) 8 mg, Oral, Every 8 hours PRN, Start on day 3 after carboplatin.   prochlorperazine (COMPAZINE) 10 mg, Oral, Every 6 hours PRN    Diet Orders (From admission, onward)     Start     Ordered   09/04/22 1137  Diet regular Room service appropriate? Yes; Fluid consistency: Thin  Diet effective now       Question Answer Comment  Room service appropriate? Yes   Fluid consistency: Thin      09/04/22 1136  DVT prophylaxis: SCDs Start: 09/02/22 2016   Lab Results  Component Value Date   PLT 882 (H) 09/07/2022      Code Status: DNR  Family Communication: friend at bedside   Status is: Inpatient  Remains inpatient appropriate because: Chest tube in place  Level of care: Telemetry Medical  Consultants:   TCS IR ID  Objective: Vitals:   09/06/22 1607 09/06/22 1942 09/07/22 0403 09/07/22 0836  BP: (!) 141/89 129/87 113/65 119/85  Pulse: (!) 102 (!) 109 (!) 101 75  Resp: 19 19 19 16   Temp: 97.6 F (36.4 C) 97.7 F (36.5 C) 97.6 F (36.4 C) 97.7 F (36.5 C)  TempSrc: Oral Oral Oral Oral  SpO2: 100% 98% 100% 97%  Weight:      Height:        Intake/Output Summary (Last 24 hours) at 09/07/2022 1118 Last data filed at 09/07/2022 0320 Gross per 24 hour  Intake 1877.32 ml  Output 100 ml  Net 1777.32 ml   Wt Readings from Last 3 Encounters:  09/02/22 48.7 kg  08/23/22 47.8 kg  08/17/22 46 kg    Examination: Constitutional: NAD Eyes: no scleral icterus ENMT: Mucous membranes are moist.  Neck: normal, supple Respiratory: clear to auscultation bilaterally, no wheezing, no crackles.  Cardiovascular: Regular rate and rhythm, no murmurs / rubs / gallops.  Abdomen: non distended, no tenderness. Bowel sounds positive.  Musculoskeletal: no clubbing / cyanosis.  Skin: no rashes  Data Reviewed: I have independently reviewed following labs and imaging studies   CBC Recent Labs  Lab 09/02/22 1400 09/03/22 0356 09/04/22 0655 09/05/22 0212 09/06/22 0313 09/07/22 0209  WBC 7.4 6.9 8.9 10.6* 13.3* 15.6*  HGB 10.6* 9.0* 10.6* 11.1* 10.7* 10.7*  HCT 32.0* 27.2* 31.5* 33.3* 32.0* 33.1*  PLT 632* 596* 777* 874* 823* 882*  MCV 93.8 92.8 91.6 92.2 92.0 95.1  MCH 31.1 30.7 30.8 30.7 30.7 30.7  MCHC 33.1 33.1 33.7 33.3 33.4 32.3  RDW 13.1 13.1 13.1 13.3 13.4 13.8  LYMPHSABS 0.4*  --   --   --   --   --   MONOABS 1.8*  --   --   --   --   --   EOSABS 0.0  --   --   --   --   --   BASOSABS 0.0  --   --   --   --   --     Recent Labs  Lab 09/02/22 1319 09/02/22 1400 09/02/22 2121 09/04/22 1600 09/05/22 0212  NA 130*  --   --  131* 131*  K 4.7  --   --  3.9 3.8  CL 95*  --   --  100 101  CO2 24  --   --  18* 19*  GLUCOSE 110*  --   --  109* 100*  BUN 16  --   --  15 15   CREATININE 0.58  --   --  0.64 0.60  CALCIUM 8.5*  --   --  8.1* 8.0*  AST 20  --   --   --   --   ALT 13  --   --   --   --   ALKPHOS 75  --   --   --   --   BILITOT 1.0  --   --   --   --   ALBUMIN 2.2*  --   --   --   --   MG  --   --   --  1.6* 1.6*  INR 1.0  --  1.2  --   --   BNP  --  120.5*  --   --   --     ------------------------------------------------------------------------------------------------------------------ No results for input(s): "CHOL", "HDL", "LDLCALC", "TRIG", "CHOLHDL", "LDLDIRECT" in the last 72 hours.  No results found for: "HGBA1C" ------------------------------------------------------------------------------------------------------------------ No results for input(s): "TSH", "T4TOTAL", "T3FREE", "THYROIDAB" in the last 72 hours.  Invalid input(s): "FREET3"  Cardiac Enzymes No results for input(s): "CKMB", "TROPONINI", "MYOGLOBIN" in the last 168 hours.  Invalid input(s): "CK" ------------------------------------------------------------------------------------------------------------------    Component Value Date/Time   BNP 120.5 (H) 09/02/2022 1400    CBG: No results for input(s): "GLUCAP" in the last 168 hours.  Recent Results (from the past 240 hour(s))  Resp panel by RT-PCR (RSV, Flu A&B, Covid) Anterior Nasal Swab     Status: None   Collection Time: 09/02/22  1:22 PM   Specimen: Anterior Nasal Swab  Result Value Ref Range Status   SARS Coronavirus 2 by RT PCR NEGATIVE NEGATIVE Final    Comment: (NOTE) SARS-CoV-2 target nucleic acids are NOT DETECTED.  The SARS-CoV-2 RNA is generally detectable in upper respiratory specimens during the acute phase of infection. The lowest concentration of SARS-CoV-2 viral copies this assay can detect is 138 copies/mL. A negative result does not preclude SARS-Cov-2 infection and should not be used as the sole basis for treatment or other patient management decisions. A negative result may occur with   improper specimen collection/handling, submission of specimen other than nasopharyngeal swab, presence of viral mutation(s) within the areas targeted by this assay, and inadequate number of viral copies(<138 copies/mL). A negative result must be combined with clinical observations, patient history, and epidemiological information. The expected result is Negative.  Fact Sheet for Patients:  EntrepreneurPulse.com.au  Fact Sheet for Healthcare Providers:  IncredibleEmployment.be  This test is no t yet approved or cleared by the [place] FDA and  has been authorized for detection and/or diagnosis of SARS-CoV-2 by FDA under an Emergency Use Authorization (EUA). This EUA will remain  in effect (meaning this test can be used) for the duration of the COVID-19 declaration under Section 564(b)(1) of the Act, 21 U.S.C.section 360bbb-3(b)(1), unless the authorization is terminated  or revoked sooner.       Influenza A by PCR NEGATIVE NEGATIVE Final   Influenza B by PCR NEGATIVE NEGATIVE Final    Comment: (NOTE) The Xpert Xpress SARS-CoV-2/FLU/RSV plus assay is intended as an aid in the diagnosis of influenza from Nasopharyngeal swab specimens and should not be used as a sole basis for treatment. Nasal washings and aspirates are unacceptable for Xpert Xpress SARS-CoV-2/FLU/RSV testing.  Fact Sheet for Patients: EntrepreneurPulse.com.au  Fact Sheet for Healthcare Providers: IncredibleEmployment.be  This test is not yet approved or cleared by the [place] FDA and has been authorized for detection and/or diagnosis of SARS-CoV-2 by FDA under an Emergency Use Authorization (EUA). This EUA will remain in effect (meaning this test can be used) for the duration of the COVID-19 declaration under Section 564(b)(1) of the Act, 21 U.S.C. section 360bbb-3(b)(1), unless the authorization is terminated or revoked.      Resp Syncytial Virus by PCR NEGATIVE NEGATIVE Final    Comment: (NOTE) Fact Sheet for Patients: EntrepreneurPulse.com.au  Fact Sheet for Healthcare Providers: IncredibleEmployment.be  This test is not yet approved or cleared by the [place] FDA and has been authorized for detection and/or diagnosis of SARS-CoV-2 by FDA under an Emergency Use Authorization (EUA). This EUA  will remain in effect (meaning this test can be used) for the duration of the COVID-19 declaration under Section 564(b)(1) of the Act, 21 U.S.C. section 360bbb-3(b)(1), unless the authorization is terminated or revoked.  Performed at [facility], [place] [address] [place], [place] [address]   Surgical PCR screen     Status: None   Collection Time: 09/03/22  6:03 AM   Specimen: Nasal Mucosa; Nasal Swab  Result Value Ref Range Status   MRSA, PCR NEGATIVE NEGATIVE Final   Staphylococcus aureus NEGATIVE NEGATIVE Final    Comment: (NOTE) The Xpert SA Assay (FDA approved for NASAL specimens in patients 73 years of age and older), is one component of a comprehensive surveillance program. It is not intended to diagnose infection nor to guide or monitor treatment. Performed at [facility], [place] [address] [place], [place] [address]   Aerobic/Anaerobic Culture w Gram Stain (surgical/deep wound)     Status: None (Preliminary result)   Collection Time: 09/04/22 10:44 AM   Specimen: Pleural Fluid  Result Value Ref Range Status   Specimen Description PLEURAL  Final   Special Requests RIGHT  Final   Gram Stain NO ORGANISMS SEEN NO WBC SEEN   Final   Culture   Final    NO GROWTH 3 DAYS NO ANAEROBES ISOLATED; CULTURE IN PROGRESS FOR 5 DAYS Performed at [facility], [place] [address], [place], [place] [address]    Report Status PENDING  Incomplete    Radiology Studies: No results found.  Marzetta Board, MD, PhD [facility]  Between 7 am  - 7 pm I am available, please contact me via Amion (for emergencies) or Securechat (non urgent messages)  Between 7 pm - 7 am I am not available, please contact night coverage MD/APP via Amion

## 2022-09-07 NOTE — Progress Notes (Signed)
ANTICOAGULATION CONSULT NOTE  Pharmacy Consult for Heparin Indication: atrial fibrillation and aortic arch mural thrombus  Allergies  Allergen Reactions   Succinylcholine Chloride Other (See Comments)    Prolonged paralysis Brand name Anectine   Codeine Nausea And Vomiting   Hydrocodone Nausea And Vomiting    Patient Measurements: Height: 5\' 4"  (162.6 cm) Weight: 48.7 kg (107 lb 5.8 oz) IBW/kg (Calculated) : 54.7  Heparin Dosing Weight: 49 kg  Vital Signs: Temp: 97.7 F (36.5 C) (01/03 0836) Temp Source: Oral (01/03 0836) BP: 119/85 (01/03 0836) Pulse Rate: 75 (01/03 0836)  Labs: Recent Labs    09/04/22 1600 09/04/22 2247 09/05/22 0212 09/06/22 0313 09/07/22 0209 09/07/22 0725  HGB  --    < > 11.1* 10.7* 10.7*  --   HCT  --   --  33.3* 32.0* 33.1*  --   PLT  --   --  874* 823* 882*  --   HEPARINUNFRC  --    < > 0.31 0.42 0.19* 0.59  CREATININE 0.64  --  0.60  --   --   --    < > = values in this interval not displayed.     Estimated Creatinine Clearance: 38.1 mL/min (by C-G formula based on SCr of 0.6 mg/dL).   Assessment: 57 YOF with medical history significant for paroxysmal Afib (no longer on Eliquis) and recently diagnosed stage IV adenocarcinoma of right lung who was found to have a mural thrombus at the aortic arch. Pharmacy consulted to manage heparin.  Morning heparin level resulted subtherapeutic at 0.19 units/mL. Patient has been therapeutic x3 days on current rate of 1100 units/hr. Ordered repeat heparin level to confirm which resulted therapeutic at 0.59 units/mL. CBC stable with no signs of bleeding. No heparin interruptions.  Goal of Therapy:  Heparin level 0.3-0.7 units/ml Monitor platelets by anticoagulation protocol: Yes   Plan:  Continue heparin infusion at 1100 units/hr Check heparin level daily while on heparin Continue to monitor H&H and platelets    Thank you for allowing pharmacy to be a part of this patient's care.  Erskine Speed, PharmD Clinical Pharmacist

## 2022-09-07 NOTE — Progress Notes (Signed)
Interventional Radiology Brief Note:  Patient s/p chest tube placement 12/31 at the request of Dr. Lonny Prude and Dr. Broadus John per the recommendation of Dr. Kipp Brood.   IR followed up 1/2 to assess chest tube and found tube intact with ongoing output.   Discussed tube management with Dr. Pascal Lux and Dr. Letta Median.  Dr. Pascal Lux recommends reconsulting TCTS for ongoing management of her originally suspected empyema and recurrent effusion in the setting of her lung cancer.   IR remains available particularly if issues arise with chest tube placement or function.   Brynda Greathouse, MS RD PA-C

## 2022-09-07 NOTE — TOC Initial Note (Signed)
Transition of Care [facility]) - Initial/Assessment Note    Patient Details  Name: Gail Jennings MRN: [id number] Date of Birth: [birth year]  Transition of [facility] CM/SW Contact:    Carles Collet, RN Phone Number: 09/07/2022, 1:48 PM  Clinical Narrative:                   Spoke with patient and friend [name] at bedside. Reviewed with them information provided to me from PT OT. Patient lives at home alone in a two story home with bedroom and bathroom on top floor. Patient was very active, driving to the gym most days of the week.  We discussed recommendations at this point for SNF, and she would like [facility] or [facility]. We discussed that she has metastatic cancer and that I requested a palliative consult for her yesterday, seeing notes from oncology office and that order has been placed today. Discussed that we it would be best for her and [name] to talk to palliative before we begin to make referrals for SNF, and they were both in agreement.  I reached out to palliative NP to let her know that patient and [name] would pefer consult while [name] was here, if that could be arranged, and that they had questions about a MOST form.   Discussed with Lilo who would she would like to make decisions for her if she were unable to. She would like [place] to contact [place] who she designated verbally to me to be able to make decisions, she would like [name] to confer with her niece [name] who lives out of town.   Snow,Linda Other   [phone]    price,nadine Niece   [phone]      Updated CSW.  Patient continues with a chest tube, not medically ready for DC today.     Barriers to Discharge: Continued Medical Work up   Patient Goals and CMS Choice            Expected Discharge Plan and Services In-house Referral: Clinical Social Work, Hospice / Palliative Care Discharge Planning Services: CM Consult   Living arrangements for the past 2 months: Single Family Home                                       Prior Living Arrangements/Services Living arrangements for the past 2 months: Single Family Home Lives with:: Self                   Activities of Daily Living Home Assistive Devices/Equipment: Environmental consultant (specify type) ADL Screening (condition at time of admission) Patient's cognitive ability adequate to safely complete daily activities?: Yes Is the patient deaf or have difficulty hearing?: No Does the patient have difficulty seeing, even when wearing glasses/contacts?: No Does the patient have difficulty concentrating, remembering, or making decisions?: No Patient able to express need for assistance with ADLs?: Yes Does the patient have difficulty dressing or bathing?: No Independently performs ADLs?: Yes (appropriate for developmental age) Does the patient have difficulty walking or climbing stairs?: Yes Weakness of Legs: Both Weakness of Arms/Hands: None  Permission Sought/Granted                  Emotional Assessment              Admission diagnosis:  Pleural effusion [J90] Empyema ([place]) [J86.9] Adenocarcinoma of right lung [facility]) [C34.91] Patient Active Problem List  Diagnosis Date Noted   Empyema ([place]) 09/02/2022   Goals of care, counseling/discussion 08/10/2022   Primary lung adenocarcinoma, right ([place]) 07/17/2022   Atrial fibrillation with RVR ([place]) 07/09/2022   Respiratory distress 07/09/2022   Pleural effusion on right 07/09/2022   Essential hypertension 07/09/2022   Chronic hyponatremia 07/09/2022   Paroxysmal A-fib ([place])    History of left indirect inguinal hernia and lap repair left femoral hernia 11/22/2012   PCP:  Burnard Bunting, MD Pharmacy:   [place], [place] - [address] [place] [address] Phone: [phone] Fax: [phone]  [facility] - [place], [place] - [address] AT [place] [address] [place] [address] Phone:  [phone] Fax: [phone]  [facility] [place] [place] [address] [facility] [address] Phone: [phone] Fax: [phone]     Social Determinants of Health (SDOH) Social History: [place]: No Food Insecurity (09/03/2022)  Housing: Low Risk  (09/03/2022)  Transportation Needs: No Transportation Needs (09/03/2022)  Utilities: Not At Risk (09/03/2022)  Tobacco Use: Medium Risk (09/03/2022)   SDOH Interventions:     Readmission Risk Interventions     No data to display

## 2022-09-07 NOTE — Consult Note (Signed)
**Note Gail-Identified via Obfuscation** Consultation Note Date: 09/07/2022   Patient Name: Gail Jennings  DOB: Jul 19, 1933  MRN: [id number]  Age / Sex: 89 y.o., female  PCP: Gail Bunting, MD Referring Physician: Caren Griffins, MD  Reason for Consultation: Establishing goals of care  HPI/Patient Profile: 89 y.o. female  with past medical history of HTN, PAF on Eliquis, and metastatic adenocarcinoma of the right lung with recurrent malignant effusion admitted on 09/02/2022 with shortness of breath, right middle lower lobe pneumonia as well as loculated pleural effusion on the right. IR placed chest tube 12/31. Receiving antibiotic therapy. Follows with Dr. Inda Jennings for metastatic lung cancer. PMT consulted to discuss [place].   Clinical Assessment and Goals of Care: I have reviewed medical records including EPIC notes, labs and imaging, received report from RN, assessed the patient and then met with patient's advocate/friend/chaplain Gail Jennings  to discuss diagnosis prognosis, GOC, EOL wishes, disposition and options.  I introduced Palliative Medicine as specialized medical care for people living with serious illness. It focuses on providing relief from the symptoms and stress of a serious illness. The goal is to improve quality of life for both the patient and the family.  Met briefly with patient but she is quite overwhelmed. Not a good day for goals of care discussion as she is just starting to understand prognosis. Will plan for formal goals of care discussion with patient 1/4.  Able to discuss patient situation with her friend and advocate Gail Jennings. We discuss treatment options patient faces: whether to continue cancer treatment or not. We discuss disposition options including hospice and type of care provided. We discussed decision makers for patient - Per Gail Jennings, her niece Gail Jennings will be named as HCPOA.   I attempted to elicit values and goals of care important to the patient.   Goals unclear at this point - need for further discussion.   Discussed with Gail Jennings the importance of continued conversation with family and the medical providers regarding overall plan of care and treatment options, ensuring decisions are within the context of the patient's values and GOCs.    Questions and concerns were addressed. Gail Jennings was encouraged to call with questions or concerns.    Primary Decision Maker PATIENT - per Gail Jennings, patient's niece Gail Jennings will be named as HCPOA    SUMMARY OF RECOMMENDATIONS   Ongoing goals of care - patient overwhelmed today, plan for further discussion with patient 1/4  Code Status/Advance Care Planning: DNR      Primary Diagnoses: Present on Admission:  Empyema ([place])  Primary lung adenocarcinoma, right ([place])  Essential hypertension   I have reviewed the medical record, interviewed the patient and family, and examined the patient. The following aspects are pertinent.  Past Medical History:  Diagnosis Date   Atrial fibrillation ([place])    Complication of anesthesia    cannot take anectine   Hypertension    Inguinal hernia    Social History   Socioeconomic History   Marital status: Divorced    Spouse name: Not on file   Number of children: 1   Years of education: Not on file   Highest education level: Not on file  Occupational History   Not on file  Tobacco Use   Smoking status: Former    Packs/day: 0.50    Years: 20.00    Total pack years: 10.00    Types: Cigarettes    Quit date: 09/21/1970    Years since quitting: 51.9   Smokeless tobacco: Never  Substance and Sexual  Activity   Alcohol use: Yes    Alcohol/week: 2.0 standard drinks of alcohol    Types: 2 Glasses of wine per week    Comment: occasional   Drug use: No   Sexual activity: Not on file  Other Topics Concern   Not on file  Social History Narrative   Not on file   Social Determinants of Health   Financial Resource Strain: Not on file  Food Insecurity: No  Food Insecurity (09/03/2022)   Hunger Vital Sign    Worried About Running Out of Food in the Last Year: Never true    Ran Out of Food in the Last Year: Never true  Transportation Needs: No Transportation Needs (09/03/2022)   PRAPARE - Hydrologist (Medical): No    Lack of Transportation (Non-Medical): No  Physical Activity: Not on file  Stress: Not on file  Social Connections: Not on file   Family History  Problem Relation Age of Onset   Parkinson's disease Mother    Healthy Sister    Scheduled Meds:  benzonatate  200 mg Oral TID   folic acid  1 mg Oral Daily   metoprolol tartrate  37.5 mg Oral BID   pneumococcal 20-valent conjugate vaccine  0.5 mL Intramuscular Tomorrow-1000   senna-docusate  2 tablet Oral BID   sodium chloride flush  10 mL Intrapleural Q8H   Continuous Infusions:  ampicillin-sulbactam (UNASYN) IV 3 g (09/07/22 1451)   heparin 1,100 Units/hr (09/07/22 0053)   PRN Meds:.hydrOXYzine, levalbuterol, melatonin, metoprolol tartrate, prochlorperazine Allergies  Allergen Reactions   Succinylcholine Chloride Other (See Comments)    Prolonged paralysis Brand name Anectine   Codeine Nausea And Vomiting   Hydrocodone Nausea And Vomiting   Review of Systems  Constitutional:  Positive for activity change and fatigue.  Psychiatric/Behavioral:  Positive for confusion.     Physical Exam Constitutional:      General: She is not in acute distress.    Appearance: She is ill-appearing.  Pulmonary:     Effort: Pulmonary effort is normal.  Skin:    General: Skin is warm and dry.  Neurological:     Mental Status: She is alert.     Vital Signs: BP 119/85 (BP Location: Right Arm)   Pulse 75   Temp 97.7 F (36.5 C) (Oral)   Resp 16   Ht _0  (1.626 m)   Wt 48.7 kg   SpO2 97%   BMI 18.43 kg/m  Pain Scale: 0-10 POSS *See Group Information*: 1-Acceptable,Awake and alert Pain Score: 0-No pain   SpO2: SpO2: 97 % O2 Device:SpO2: 97  % O2 Flow Rate: .O2 Flow Rate (L/min): 3 L/min  IO: Intake/output summary:  Intake/Output Summary (Last 24 hours) at 09/07/2022 1604 Last data filed at 09/07/2022 0320 Gross per 24 hour  Intake 1777.32 ml  Output 100 ml  Net 1677.32 ml    LBM: Last BM Date : 09/05/22 Baseline Weight: Weight: 45.4 kg Most recent weight: Weight: 48.7 kg      *Please note that this is a verbal dictation therefore any spelling or grammatical errors are due to the "[place]" system interpretation.   Juel Burrow, DNP, AGNP-C Palliative Medicine Team [phone] Pager: [phone]

## 2022-09-07 NOTE — Consult Note (Signed)
Date of Admission:  09/02/2022          Reason for Consult: Empyema + recurrent malignant effusion   Referring Provider: Marzetta Board, MD   Assessment:  Recurrent malignant effusion with concerns for worsening pleural metastases + Superimposed bacterial empyema Atrial fibrillation HTN Cachexia Anxiety  Plan:  Narrow to unasyn Follow-up pleural fluid cultures Agree with Palliative Care consult We also need clarity from IR and CT surgery re plan for her pleural space--she would NOT seem to be good operative candidate in my view Patient and advocate would also like to see Hematology/Oncology   Principal Problem:   Empyema ([place]) Active Problems:   Essential hypertension   Primary lung adenocarcinoma, right ([place])   Goals of care, counseling/discussion   Scheduled Meds:  benzonatate  200 mg Oral TID   folic acid  1 mg Oral Daily   metoprolol tartrate  37.5 mg Oral BID   pneumococcal 20-valent conjugate vaccine  0.5 mL Intramuscular Tomorrow-1000   senna-docusate  2 tablet Oral BID   sodium chloride flush  10 mL Intrapleural Q8H   Continuous Infusions:  ampicillin-sulbactam (UNASYN) IV     heparin 1,100 Units/hr (09/07/22 0053)   PRN Meds:.hydrOXYzine, levalbuterol, melatonin, metoprolol tartrate, prochlorperazine  HPI: Gail Jennings is a 89 y.o. female w hx of HTN, PAF, diagnosed with stage IV adenocarcinoma of the right lung November 23 with malignant pleural effusions that required 5 thoracentesis in the past 6 weeks. She has started low dose chemotherpay with Dr. Earlie Jennings  3 drier days prior to admission she underwent thoracentesis again but only 600 mL were Cap[place] being removed.  She had worsening dyspnea shortness of breath platypnea and presented to the ER.  In the ER she had CT chest performed which showed right pleural thickening concerning for pleural metastatic disease with a loculated right pleural effusion with interval gas bubbles  concerning for superimposed empyema with consolidation of the right middle and lower lobes as well as a rim-enhancing fluid collection in the right posterior chest wall with possible medication of the pleural space.  Additionally a free-flowing mural thrombus in the aortic arch was seen.  The patient was started on vancomycin and cefepime.  She was transferred to [facility] for consideration of VATS decortication.  It is my understanding that the patient is not an ideal candidate for cardiothoracic surgery with VATS decortication at this point in time.  Did have chest tube placed on 04 September 2022.  Cultures obtained at the time of chest tube insertion have been unrevealing at 3 days.  I feel comfortable narrowing her to unasyn at this time.  I suspect the majority of her pathology is being driven by her metastatic lung cancer  I think that most of all she needs palliative care consult and in my view shift to pure palliative care.  I spent 84 minutes with the patient including than 50% of the time in face to face counseling of the patient and her advocate for malignant effusion with superimposed bacterial infection, personally reviewing ET of the chest along with review of medical records in preparation for the visit and during the visit and in coordination of her care.      Review of Systems: Review of Systems  Constitutional:  Positive for malaise/fatigue and weight loss. Negative for chills and fever.  HENT:  Negative for congestion and sore throat.   Eyes:  Negative for blurred vision and photophobia.  Respiratory:  Positive for shortness of breath. Negative for cough and wheezing.   Cardiovascular:  Negative for chest pain, palpitations and leg swelling.  Gastrointestinal:  Negative for abdominal pain, blood in stool, constipation, diarrhea, heartburn, melena, nausea and vomiting.  Genitourinary:  Negative for dysuria, flank pain and hematuria.  Musculoskeletal:   Negative for back pain, falls, joint pain and myalgias.  Skin:  Negative for itching and rash.  Neurological:  Positive for weakness. Negative for dizziness, focal weakness, loss of consciousness and headaches.  Endo/Heme/Allergies:  Bruises/bleeds easily.  Psychiatric/Behavioral:  Positive for depression. Negative for suicidal ideas. The patient is nervous/anxious. The patient does not have insomnia.     Past Medical History:  Diagnosis Date   Atrial fibrillation ([place])    Complication of anesthesia    cannot take anectine   Hypertension    Inguinal hernia     Social History   Tobacco Use   Smoking status: Former    Packs/day: 0.50    Years: 20.00    Total pack years: 10.00    Types: Cigarettes    Quit date: 09/21/1970    Years since quitting: 51.9   Smokeless tobacco: Never  Substance Use Topics   Alcohol use: Yes    Alcohol/week: 2.0 standard drinks of alcohol    Types: 2 Glasses of wine per week    Comment: occasional   Drug use: No    Family History  Problem Relation Age of Onset   Parkinson's disease Mother    Healthy Sister    Allergies  Allergen Reactions   Succinylcholine Chloride Other (See Comments)    Prolonged paralysis Brand name Anectine   Codeine Nausea And Vomiting   Hydrocodone Nausea And Vomiting    OBJECTIVE: Blood pressure 119/85, pulse 75, temperature 97.7 F (36.5 C), temperature source Oral, resp. rate 16, height 5\' 4"  (1.626 m), weight 48.7 kg, SpO2 97 %.  Physical Exam Constitutional:      General: She is not in acute distress.    Appearance: Normal appearance. She is cachectic. She is ill-appearing. She is not diaphoretic.  HENT:     Head: Normocephalic and atraumatic.     Right Ear: Hearing and external ear normal.     Left Ear: Hearing and external ear normal.     Nose: No nasal deformity or rhinorrhea.  Eyes:     General: No scleral icterus.    Conjunctiva/sclera: Conjunctivae normal.     Right eye: Right conjunctiva is not  injected.     Left eye: Left conjunctiva is not injected.     Pupils: Pupils are equal, round, and reactive to light.  Neck:     Vascular: No JVD.  Cardiovascular:     Rate and Rhythm: Normal rate and regular rhythm.     Heart sounds: S1 normal and S2 normal.  Pulmonary:     Effort: Pulmonary effort is normal. No respiratory distress.     Breath sounds: No stridor. Examination of the right-lower field reveals decreased breath sounds. Examination of the left-lower field reveals decreased breath sounds. Decreased breath sounds present. No wheezing or rhonchi.  Abdominal:     General: Bowel sounds are normal. There is no distension.     Palpations: Abdomen is soft.     Tenderness: There is no abdominal tenderness.  Musculoskeletal:        General: Normal range of motion.     Right shoulder: Normal.     Left shoulder: Normal.     Cervical back:  Normal range of motion and neck supple.     Right hip: Normal.     Left hip: Normal.     Right knee: Normal.     Left knee: Normal.  Lymphadenopathy:     Head:     Right side of head: No submandibular, preauricular or posterior auricular adenopathy.     Left side of head: No submandibular, preauricular or posterior auricular adenopathy.     Cervical: No cervical adenopathy.     Right cervical: No superficial or deep cervical adenopathy.    Left cervical: No superficial or deep cervical adenopathy.  Skin:    General: Skin is warm and dry.     Coloration: Skin is not pale.     Findings: No abrasion, bruising, ecchymosis, erythema, lesion or rash.     Nails: There is no clubbing.  Neurological:     Mental Status: She is alert and oriented to person, place, and time.     Sensory: No sensory deficit.     Coordination: Coordination normal.     Gait: Gait normal.  Psychiatric:        Attention and Perception: Attention normal. She is attentive.        Mood and Affect: Mood is anxious and depressed.        Speech: Speech normal.         Behavior: Behavior is cooperative.        Thought Content: Thought content normal.        Cognition and Memory: Cognition and memory normal.        Judgment: Judgment normal.     Lab Results Lab Results  Component Value Date   WBC 15.6 (H) 09/07/2022   HGB 10.7 (L) 09/07/2022   HCT 33.1 (L) 09/07/2022   MCV 95.1 09/07/2022   PLT 882 (H) 09/07/2022    Lab Results  Component Value Date   CREATININE 0.60 09/05/2022   BUN 15 09/05/2022   NA 131 (L) 09/05/2022   K 3.8 09/05/2022   CL 101 09/05/2022   CO2 19 (L) 09/05/2022    Lab Results  Component Value Date   ALT 13 09/02/2022   AST 20 09/02/2022   ALKPHOS 75 09/02/2022   BILITOT 1.0 09/02/2022     Microbiology: Recent Results (from the past 240 hour(s))  Resp panel by RT-PCR (RSV, Flu A&B, Covid) Anterior Nasal Swab     Status: None   Collection Time: 09/02/22  1:22 PM   Specimen: Anterior Nasal Swab  Result Value Ref Range Status   SARS Coronavirus 2 by RT PCR NEGATIVE NEGATIVE Final    Comment: (NOTE) SARS-CoV-2 target nucleic acids are NOT DETECTED.  The SARS-CoV-2 RNA is generally detectable in upper respiratory specimens during the acute phase of infection. The lowest concentration of SARS-CoV-2 viral copies this assay can detect is 138 copies/mL. A negative result does not preclude SARS-Cov-2 infection and should not be used as the sole basis for treatment or other patient management decisions. A negative result may occur with  improper specimen collection/handling, submission of specimen other than nasopharyngeal swab, presence of viral mutation(s) within the areas targeted by this assay, and inadequate number of viral copies(<138 copies/mL). A negative result must be combined with clinical observations, patient history, and epidemiological information. The expected result is Negative.  Fact Sheet for Patients:  EntrepreneurPulse.com.au  Fact Sheet for Healthcare Providers:   IncredibleEmployment.be  This test is no t yet approved or cleared by the [place] and  has been authorized for detection and/or diagnosis of SARS-CoV-2 by FDA under an Emergency Use Authorization (EUA). This EUA will remain  in effect (meaning this test can be used) for the duration of the COVID-19 declaration under Section 564(b)(1) of the Act, 21 U.S.C.section 360bbb-3(b)(1), unless the authorization is terminated  or revoked sooner.       Influenza A by PCR NEGATIVE NEGATIVE Final   Influenza B by PCR NEGATIVE NEGATIVE Final    Comment: (NOTE) The Xpert Xpress SARS-CoV-2/FLU/RSV plus assay is intended as an aid in the diagnosis of influenza from Nasopharyngeal swab specimens and should not be used as a sole basis for treatment. Nasal washings and aspirates are unacceptable for Xpert Xpress SARS-CoV-2/FLU/RSV testing.  Fact Sheet for Patients: EntrepreneurPulse.com.au  Fact Sheet for Healthcare Providers: IncredibleEmployment.be  This test is not yet approved or cleared by the [place] FDA and has been authorized for detection and/or diagnosis of SARS-CoV-2 by FDA under an Emergency Use Authorization (EUA). This EUA will remain in effect (meaning this test can be used) for the duration of the COVID-19 declaration under Section 564(b)(1) of the Act, 21 U.S.C. section 360bbb-3(b)(1), unless the authorization is terminated or revoked.     Resp Syncytial Virus by PCR NEGATIVE NEGATIVE Final    Comment: (NOTE) Fact Sheet for Patients: EntrepreneurPulse.com.au  Fact Sheet for Healthcare Providers: IncredibleEmployment.be  This test is not yet approved or cleared by the [place] FDA and has been authorized for detection and/or diagnosis of SARS-CoV-2 by FDA under an Emergency Use Authorization (EUA). This EUA will remain in effect (meaning this test can be used) for  the duration of the COVID-19 declaration under Section 564(b)(1) of the Act, 21 U.S.C. section 360bbb-3(b)(1), unless the authorization is terminated or revoked.  Performed at [facility], [place] [address] [place], [place] [address]   Surgical PCR screen     Status: None   Collection Time: 09/03/22  6:03 AM   Specimen: Nasal Mucosa; Nasal Swab  Result Value Ref Range Status   MRSA, PCR NEGATIVE NEGATIVE Final   Staphylococcus aureus NEGATIVE NEGATIVE Final    Comment: (NOTE) The Xpert SA Assay (FDA approved for NASAL specimens in patients 10 years of age and older), is one component of a comprehensive surveillance program. It is not intended to diagnose infection nor to guide or monitor treatment. Performed at [facility], [place] [address], [place], [place] [address]   Aerobic/Anaerobic Culture w Gram Stain (surgical/deep wound)     Status: None (Preliminary result)   Collection Time: 09/04/22 10:44 AM   Specimen: Pleural Fluid  Result Value Ref Range Status   Specimen Description PLEURAL  Final   Special Requests RIGHT  Final   Gram Stain NO ORGANISMS SEEN NO WBC SEEN   Final   Culture   Final    NO GROWTH 3 DAYS NO ANAEROBES ISOLATED; CULTURE IN PROGRESS FOR 5 DAYS Performed at [facility], [place] [address] [place], [place] [address]    Report Status PENDING  Incomplete    Alcide Evener, [facility] [phone] pager  09/07/2022, 1:19 PM

## 2022-09-07 NOTE — Evaluation (Signed)
**Note Gail-Identified via Obfuscation** Physical Therapy Evaluation Patient Details Name: Gail Jennings MRN: [id number] DOB: [birth year] Today's Date: 09/07/2022  History of Present Illness  Patient is a 89 year old female with Loculated pleural effusion s/p chest tube placement on 12/31, pneumonia, acute respiratory failure with hypoxia, A-fib with RVR. History of metastatic adenocarcinoma of right lung with chemotherapy.  Clinical Impression  Patient is anxious during session and needs encouragement and reassurance to participate. Gail Jennings was present for the evaluation. Patient is independent and active at baseline, lives alone and goes to the gym multiple days per week.  Today, the patient was perseverative on needing to use the bathroom. She required assistance for bed mobility and to stand from the bed, but had difficulty sequencing how to perform a pivot to the bed side commode despite direction. Poor standing balance and fatigue with minimal activity. At this time, patient is unsafe to return home alone. Recommend PT follow up while in the hospital to maximize independence and decrease caregiver burden. SNF recommended at this time.      Recommendations for follow up therapy are one component of a multi-disciplinary discharge planning process, led by the attending physician.  Recommendations may be updated based on patient status, additional functional criteria and insurance authorization.  Follow Up Recommendations Skilled nursing-short term rehab (<3 hours/day) Can patient physically be transported by private vehicle: No    Assistance Recommended at Discharge Frequent or constant Supervision/Assistance  Patient can return home with the following  A lot of help with walking and/or transfers;A lot of help with bathing/dressing/bathroom;Help with stairs or ramp for entrance;Assist for transportation;Assistance with cooking/housework    Equipment Recommendations Rolling walker (2 wheels)  Recommendations for Other  Services       Functional Status Assessment Patient has had a recent decline in their functional status and demonstrates the ability to make significant improvements in function in a reasonable and predictable amount of time.     Precautions / Restrictions Precautions Precautions: Fall Precaution Comments: R chest tube Restrictions Weight Bearing Restrictions: No      Mobility  Bed Mobility Overal bed mobility: Needs Assistance Bed Mobility: Sit to Supine, Supine to Sit, Rolling Rolling: Min assist   Supine to sit: Mod assist Sit to supine: Mod assist   General bed mobility comments: assistance for trunk support to sit upright and assistance for LE support to return to bed. verbal cues for task initiation and sequencing    Transfers Overall transfer level: Needs assistance Equipment used: None Transfers: Sit to/from Stand Sit to Stand: Mod assist           General transfer comment: lifting and lowering assistance provided for standing. patient has difficulty with sequencing to pivot to the bed side commode despite cues and becomes increasingly anxious with activity.    Ambulation/Gait               General Gait Details: not attempted  Stairs            Wheelchair Mobility    Modified Rankin (Stroke Patients Only)       Balance Overall balance assessment: Needs assistance Sitting-balance support: Feet supported Sitting balance-Leahy Scale: Fair     Standing balance support: Bilateral upper extremity supported Standing balance-Leahy Scale: Zero Standing balance comment: Mod A- Max A required to maintain standing balance with posterior lean. standing tolerance of ~ 10 seconds  Pertinent Vitals/Pain Pain Assessment Pain Assessment: No/denies pain    Home Living Family/patient expects to be discharged to:: Private residence Living Arrangements: Alone Available Help at Discharge: Friend(s);Available  PRN/intermittently Type of Home: House Home Access: Stairs to enter Entrance Stairs-Rails: Psychiatric nurse of Steps: 2   Home Layout: Two level;1/2 bath on main level;Bed/bath upstairs Home Equipment: Grab bars - tub/shower      Prior Function Prior Level of Function : Independent/Modified Independent;Driving             Mobility Comments: per patient, she drives to the gym 4 days a week to walk on the treadmill and lift weights. independent without assistive device ADLs Comments: independent     Hand Dominance        Extremity/Trunk Assessment   Upper Extremity Assessment Upper Extremity Assessment: Generalized weakness    Lower Extremity Assessment Lower Extremity Assessment: Generalized weakness       Communication   Communication: No difficulties  Cognition Arousal/Alertness: Awake/alert Behavior During Therapy: Anxious, Restless, Agitated Overall Cognitive Status: Impaired/Different from baseline Area of Impairment: Orientation, Attention, Following commands, Problem solving, Safety/judgement, Memory                 Orientation Level: Disoriented to, Situation, Time   Memory: Decreased short-term memory Following Commands: Follows one step commands inconsistently Safety/Judgement: Decreased awareness of safety, Decreased awareness of deficits   Problem Solving: Difficulty sequencing, Requires verbal cues, Requires tactile cues, Decreased initiation, Slow processing General Comments: patient needs reassurance with all activity and seems very anxious at times, perseverating on needing to use the bathroom. Gail Jennings is patient's healthcare advocate and was present during the session and provided some information for PLOF.        General Comments General comments (skin integrity, edema, etc.): patient fatigued with minimal activity.    Exercises     Assessment/Plan    PT Assessment Patient needs continued PT services  PT  Problem List Decreased range of motion;Decreased strength;Decreased activity tolerance;Decreased balance;Decreased mobility;Decreased knowledge of use of DME;Decreased safety awareness;Decreased cognition       PT Treatment Interventions DME instruction;Gait training;Stair training;Functional mobility training;Therapeutic activities;Therapeutic exercise;Balance training;Neuromuscular re-education;Cognitive remediation;Patient/family education    PT Goals (Current goals can be found in the Care Plan section)  Acute Rehab PT Goals Patient Stated Goal: not to go to rehab PT Goal Formulation: With patient Time For Goal Achievement: 09/21/22 Potential to Achieve Goals: Fair    Frequency Min 3X/week     Co-evaluation               AM-PAC PT "6 Clicks" Mobility  Outcome Measure Help needed turning from your back to your side while in a flat bed without using bedrails?: A Little Help needed moving from lying on your back to sitting on the side of a flat bed without using bedrails?: A Lot Help needed moving to and from a bed to a chair (including a wheelchair)?: A Lot Help needed standing up from a chair using your arms (e.g., wheelchair or bedside chair)?: A Lot Help needed to walk in hospital room?: A Lot Help needed climbing 3-5 steps with a railing? : A Lot 6 Click Score: 13    End of Session Equipment Utilized During Treatment: Oxygen Activity Tolerance: Patient limited by fatigue Patient left: in bed;with call bell/phone within reach (with OT at the bedside)   PT Visit Diagnosis: Muscle weakness (generalized) (M62.81);Other abnormalities of gait and mobility (R26.89)    Time: 7989-2119 PT  Time Calculation (min) (ACUTE ONLY): 28 min   Charges:   PT Evaluation $PT Eval Low Complexity: 1 Low PT Treatments $Therapeutic Activity: 8-22 mins        Minna Merritts, PT, MPT  Percell Locus 09/07/2022, 12:53 PM

## 2022-09-07 NOTE — Consult Note (Signed)
NAME:  Gail Jennings, MRN:  [id number], DOB:  02-Apr-1933, LOS: 5 ADMISSION DATE:  09/02/2022, CONSULTATION DATE:  1/3 REFERRING MD:  Dr. Renne Crigler, CHIEF COMPLAINT:  pleural effusion   History of Present Illness:  89 year old female with PMH as below, which is significant for stage IV adenocarcinoma of the lung with malignant right sided effusion. She has had multiple thoracenteses. Met with Dr. Valeta Harms 12/11 for consideration of Pleurx catheter, but declined over concerns she would not be able to adequately care for it. She presented to [facility] 12/29 with complaints of progressive SOB. She underwent CT chest in the ED showing loculated right pleural effusion with gas bubbles concerning for empyema. CT surgery was consulted and recommended IR chest tube placement, which was done 12/31. The patient was transferred to [facility] for CVTS to see, however, they determined she was not a surgical candidate. PCCM has been asked to assist with chest tube management.   Pertinent  Medical History   has a past medical history of Atrial fibrillation ([place]), Complication of anesthesia, Hypertension, and Inguinal hernia.   Significant Hospital Events: Including procedures, antibiotic start and stop dates in addition to other pertinent events   12/29 admit for empyema 12/31 CT placed 1/3 PCCM consult  Interim History / Subjective:    Objective   Blood pressure 119/85, pulse 75, temperature 97.7 F (36.5 C), temperature source Oral, resp. rate 16, height _0  (1.626 m), weight 48.7 kg, SpO2 97 %.        Intake/Output Summary (Last 24 hours) at 09/07/2022 1519 Last data filed at 09/07/2022 0320 Gross per 24 hour  Intake 1777.32 ml  Output 100 ml  Net 1677.32 ml   Filed Weights   09/02/22 1937 09/02/22 2022  Weight: 45.4 kg 48.7 kg    Examination:  General: Cachectic elderly female in NAD HENT: [place]/AT, PERRL, no JVD Lungs: Diminished right base Cardiovascular: RRR, no MRG Abdomen: Soft,  non-tender, non-distended Extremities: No acute deformity or ROM limitation Neuro: Alert oriented. Some confusion understanding new information.   Resolved Hospital Problem list     Assessment & Plan:   Pleural effusion on the right: Known malignant effusion s/p multiple thoracentesis. CT placed by IR 12/31. Pleural fluid studies not sent. Cultures have no growth to date. Large effusion by CT with gas concerning for empyema. Only about 600 cc have drained since placement. CXR showing residual effusion.  - Discussed options with patient and healthcare proxy at bedside.  - Will trial intrapleural administration of alteplase and dnase.  - Dwell for 1 hour then drain - Routine CT flushes - Keep CT to 20 cm H2O - Repeat CXR in AM - Tube will be difficult to DC due to malignant effusion. Has considered PleurX in the past, but she feels her home support is inadequate. We can address this at a later time. Agree with palliative consult.    Best Practice (right click and "Reselect all SmartList Selections" daily)   Per primary  Labs   CBC: Recent Labs  Lab 09/02/22 1400 09/03/22 0356 09/04/22 0655 09/05/22 0212 09/06/22 0313 09/07/22 0209  WBC 7.4 6.9 8.9 10.6* 13.3* 15.6*  NEUTROABS 5.1  --   --   --   --   --   HGB 10.6* 9.0* 10.6* 11.1* 10.7* 10.7*  HCT 32.0* 27.2* 31.5* 33.3* 32.0* 33.1*  MCV 93.8 92.8 91.6 92.2 92.0 95.1  PLT 632* 596* 777* 874* 823* 882*    Basic Metabolic Panel: Recent  Labs  Lab 09/02/22 1319 09/04/22 1600 09/05/22 0212  NA 130* 131* 131*  K 4.7 3.9 3.8  CL 95* 100 101  CO2 24 18* 19*  GLUCOSE 110* 109* 100*  BUN _0 CREATININE 0.58 0.64 0.60  CALCIUM 8.5* 8.1* 8.0*  MG  --  1.6* 1.6*   GFR: Estimated Creatinine Clearance: 38.1 mL/min (by C-G formula based on SCr of 0.6 mg/dL). Recent Labs  Lab 09/04/22 0655 09/05/22 0212 09/06/22 0313 09/07/22 0209  WBC 8.9 10.6* 13.3* 15.6*    Liver Function Tests: Recent Labs  Lab  09/02/22 1319  AST 20  ALT 13  ALKPHOS 75  BILITOT 1.0  PROT 6.1*  ALBUMIN 2.2*   No results for input(s): "LIPASE", "AMYLASE" in the last 168 hours. No results for input(s): "AMMONIA" in the last 168 hours.  ABG No results found for: "PHART", "PCO2ART", "PO2ART", "HCO3", "TCO2", "ACIDBASEDEF", "O2SAT"   Coagulation Profile: Recent Labs  Lab 09/02/22 1319 09/02/22 2121  INR 1.0 1.2    Cardiac Enzymes: No results for input(s): "CKTOTAL", "CKMB", "CKMBINDEX", "TROPONINI" in the last 168 hours.  HbA1C: No results found for: "HGBA1C"  CBG: No results for input(s): "GLUCAP" in the last 168 hours.  Review of Systems:   Bolds are positive  Constitutional: weight loss, gain, night sweats, Fevers, chills, fatigue .  HEENT: headaches, Sore throat, sneezing, nasal congestion, post nasal drip, Difficulty swallowing, Tooth/dental problems, visual complaints visual changes, ear ache CV:  chest pain, radiates:,Orthopnea, PND, swelling in lower extremities**, dizziness, palpitations, syncope.  GI  heartburn, indigestion, abdominal pain, nausea, vomiting, diarrhea, change in bowel habits, loss of appetite, bloody stools. Constipation Resp: cough, productive: , hemoptysis, dyspnea, chest pain, pleuritic.  Skin: rash or itching or icterus GU: dysuria, change in color of urine, urgency or frequency. flank pain, hematuria  MS: joint pain or swelling. decreased range of motion  Psych: change in mood or affect. depression or anxiety.  Neuro: difficulty with speech, weakness, numbness, ataxia    Past Medical History:  She,  has a past medical history of Atrial fibrillation ([place]), Complication of anesthesia, Hypertension, and Inguinal hernia.   Surgical History:   Past Surgical History:  Procedure Laterality Date   ABDOMINAL HYSTERECTOMY     DENTAL SURGERY  Jan 11, 2011   bone graft and extraction   EYE SURGERY     detached retina, cryo, lens implant   INGUINAL HERNIA REPAIR   09/27/2011   Procedure: HERNIA REPAIR INGUINAL INCARCERATED;  Surgeon: Pedro Earls, MD;  Location: [facility];  Service: General;  Laterality: Left;  Left Ingunial hernia repair with mesh   INGUINAL HERNIA REPAIR Left 10/19/2012   Procedure: LAPAROSCOPIC possible open left femoral hernia repair ;  Surgeon: Pedro Earls, MD;  Location: [facility];  Service: General;  Laterality: Left;  Laparoscopic Left Femoral Hernia Repair with Mesh   IR THORACENTESIS ASP PLEURAL SPACE W/IMG GUIDE  07/27/2022   IR THORACENTESIS ASP PLEURAL SPACE W/IMG GUIDE  08/02/2022   IR THORACENTESIS ASP PLEURAL SPACE W/IMG GUIDE  08/09/2022   IR THORACENTESIS ASP PLEURAL SPACE W/IMG GUIDE  08/24/2022   IR THORACENTESIS ASP PLEURAL SPACE W/IMG GUIDE  08/30/2022   MOUTH SURGERY     lt upper bone graft for implants   TONSILLECTOMY     TUBAL LIGATION       Social History:   reports that she quit smoking about 51 years ago. Her smoking use included cigarettes. She has a  10.00 pack-year smoking history. She has never used smokeless tobacco. She reports current alcohol use of about 2.0 standard drinks of alcohol per week. She reports that she does not use drugs.   Family History:  Her family history includes Healthy in her sister; Parkinson's disease in her mother.   Allergies Allergies  Allergen Reactions   Succinylcholine Chloride Other (See Comments)    Prolonged paralysis Brand name Anectine   Codeine Nausea And Vomiting   Hydrocodone Nausea And Vomiting     Home Medications  Prior to Admission medications   Medication Sig Start Date End Date Taking? Authorizing Provider  folic acid (FOLVITE) 1 MG tablet Take 1 tablet (1 mg total) by mouth daily. 08/10/22  Yes Heilingoetter, Cassandra L, PA-C  metoprolol tartrate (LOPRESSOR) 25 MG tablet Take 0.5 tablets (12.5 mg total) by mouth 2 (two) times daily. 07/17/22  Yes [name], MD  ondansetron (ZOFRAN) 8 MG tablet Take 1 tablet (8 mg total) by  mouth every 8 (eight) hours as needed for nausea or vomiting. Start on day 3 after carboplatin. 08/16/22  Yes Curt Bears, MD  prochlorperazine (COMPAZINE) 10 MG tablet Take 1 tablet (10 mg total) by mouth every 6 (six) hours as needed for nausea or vomiting. 08/16/22  Yes Curt Bears, MD  apixaban (ELIQUIS) 2.5 MG TABS tablet Take 1 tablet (2.5 mg total) by mouth 2 (two) times daily. Patient not taking: Reported on 09/02/2022 07/17/22   [name], MD     Critical care time: N/a     Georgann Housekeeper, AGACNP-BC [place]  See Amion for personal pager PCCM on call pager [phone] until 7pm. Please call Elink 7p-7a. [phone]  09/07/2022 3:34 PM

## 2022-09-07 NOTE — Telephone Encounter (Signed)
Gail Jennings be seeing pt today. Message sent to [facility].

## 2022-09-07 NOTE — Evaluation (Signed)
Occupational Therapy Evaluation Patient Details Name: Gail Jennings MRN: [id number] DOB: October 23, 1932 Today's Date: 09/07/2022   History of Present Illness Patient is a 89 year old female with Loculated pleural effusion s/p chest tube placement on 12/31, pneumonia, acute respiratory failure with hypoxia, A-fib with RVR. History of metastatic adenocarcinoma of right lung with chemotherapy.   Clinical Impression   Prior to this admission, patient lived alone, went to the gym for multiple hours a day, and drove (information received from health care liaison and patient). Currently, patient with significant confusion, poor motor planning, and need for increased cues to maintain attention to task. Patient requiring mod-max A for all ADL management, and unable to sequence or demonstrate the strength to complete a BSC transfer at a plus one level. OT recommending SNF currently, however will defer to palliative consult for final disposition.       Recommendations for follow up therapy are one component of a multi-disciplinary discharge planning process, led by the attending physician.  Recommendations may be updated based on patient status, additional functional criteria and insurance authorization.   Follow Up Recommendations  Skilled nursing-short term rehab (<3 hours/day)     Assistance Recommended at Discharge Frequent or constant Supervision/Assistance  Patient can return home with the following Two people to help with walking and/or transfers;Two people to help with bathing/dressing/bathroom;Assistance with cooking/housework;Direct supervision/assist for medications management;Direct supervision/assist for financial management;Assist for transportation;Help with stairs or ramp for entrance    Functional Status Assessment  Patient has had a recent decline in their functional status and demonstrates the ability to make significant improvements in function in a reasonable and predictable amount  of time.  Equipment Recommendations  Other (comment) (defer to next venue of care)    Recommendations for Other Services       Precautions / Restrictions Precautions Precautions: Fall Precaution Comments: R chest tube Restrictions Weight Bearing Restrictions: No      Mobility Bed Mobility Overal bed mobility: Needs Assistance Bed Mobility: Sit to Supine, Supine to Sit, Rolling Rolling: Min assist   Supine to sit: Mod assist Sit to supine: Mod assist   General bed mobility comments: assistance for trunk support to sit upright and assistance for LE support to return to bed. verbal cues for task initiation and sequencing    Transfers Overall transfer level: Needs assistance Equipment used: None Transfers: Sit to/from Stand Sit to Stand: Mod assist           General transfer comment: lifting and lowering assistance provided for standing. patient has difficulty with sequencing to pivot to the bed side commode despite cues and becomes increasingly anxious with activity.      Balance Overall balance assessment: Needs assistance Sitting-balance support: Feet supported Sitting balance-Leahy Scale: Fair     Standing balance support: Bilateral upper extremity supported Standing balance-Leahy Scale: Zero Standing balance comment: Mod A- Max A required to maintain standing balance with posterior lean. standing tolerance of ~ 10 seconds                           ADL either performed or assessed with clinical judgement   ADL Overall ADL's : Needs assistance/impaired Eating/Feeding: Minimal assistance;Sitting   Grooming: Minimal assistance;Moderate assistance;Cueing for sequencing Grooming Details (indicate cue type and reason): cues to sequence basic ADLs Upper Body Bathing: Minimal assistance;Moderate assistance;Cueing for sequencing;Cueing for safety   Lower Body Bathing: Maximal assistance;Total assistance;Cueing for sequencing;Cueing for safety   Upper  Body  Dressing : Minimal assistance;Moderate assistance;Cueing for sequencing   Lower Body Dressing: Maximal assistance;Total assistance;Sitting/lateral leans;Sit to/from stand;Cueing for safety;Cueing for sequencing   Toilet Transfer: Moderate assistance;Cueing for safety;Cueing for sequencing;Stand-pivot Toilet Transfer Details (indicate cue type and reason): unable to complete stand pivot to [facility], poor safety awareness, requiring bed pan and step by step instruction, able to bridge to place bed pan Toileting- Clothing Manipulation and Hygiene: Total assistance;Bed level       Functional mobility during ADLs: +2 for physical assistance;+2 for safety/equipment;Moderate assistance;Cueing for safety;Cueing for sequencing General ADL Comments: Patient requiring cues and assist for all aspects of care, confused and agitated     Vision Baseline Vision/History: 0 No visual deficits Ability to See in Adequate Light: 0 Adequate Patient Visual Report: No change from baseline;Other (comment) (Will continue to assess due to confusion/agitation)       Perception     Praxis      Pertinent Vitals/Pain Pain Assessment Pain Assessment: No/denies pain     Hand Dominance     Extremity/Trunk Assessment Upper Extremity Assessment Upper Extremity Assessment: Generalized weakness   Lower Extremity Assessment Lower Extremity Assessment: Generalized weakness;Defer to PT evaluation       Communication Communication Communication: No difficulties   Cognition Arousal/Alertness: Awake/alert Behavior During Therapy: Anxious, Restless, Agitated Overall Cognitive Status: Impaired/Different from baseline Area of Impairment: Orientation, Attention, Following commands, Problem solving, Safety/judgement, Memory                 Orientation Level: Disoriented to, Situation, Time   Memory: Decreased short-term memory Following Commands: Follows one step commands inconsistently Safety/Judgement:  Decreased awareness of safety, Decreased awareness of deficits   Problem Solving: Difficulty sequencing, Requires verbal cues, Requires tactile cues, Decreased initiation, Slow processing General Comments: patient needs reassurance with all activity and seems very anxious at times, perseverating on needing to use the bathroom. [name] is patient's healthcare advocate and was present during the session and provided some information for PLOF.     General Comments  patient fatigued with minimal activity.    Exercises     Shoulder Instructions      Home Living Family/patient expects to be discharged to:: Private residence Living Arrangements: Alone Available Help at Discharge: Friend(s);Available PRN/intermittently Type of Home: House Home Access: Stairs to enter [employer] of Steps: 2 Entrance Stairs-Rails: Right;Left Home Layout: Two level;1/2 bath on main level;Bed/bath upstairs     Bathroom Shower/Tub: Walk-in shower         Home Equipment: Grab bars - tub/shower          Prior Functioning/Environment Prior Level of Function : Independent/Modified Independent;Driving             Mobility Comments: per patient, she drives to the gym 4 days a week to walk on the treadmill and lift weights. independent without assistive device ADLs Comments: independent        OT Problem List: Decreased strength;Decreased range of motion;Decreased activity tolerance;Impaired balance (sitting and/or standing);Decreased coordination;Decreased cognition;Decreased safety awareness;Decreased knowledge of use of DME or AE;Decreased knowledge of precautions      OT Treatment/Interventions: Self-care/ADL training;Therapeutic exercise;Energy conservation;DME and/or AE instruction;Manual therapy;Modalities;Therapeutic activities;Cognitive remediation/compensation;Patient/family education;Balance training    OT Goals(Current goals can be found in the care plan section) Acute  Rehab OT Goals Patient Stated Goal: unable to state OT Goal Formulation: Patient unable to participate in goal setting Time For Goal Achievement: 09/21/22 Potential to Achieve Goals: Fair  OT Frequency: Min 2X/week  Co-evaluation              AM-PAC OT "6 Clicks" Daily Activity     Outcome Measure Help from another person eating meals?: A Little Help from another person taking care of personal grooming?: A Lot Help from another person toileting, which includes using toliet, bedpan, or urinal?: A Lot Help from another person bathing (including washing, rinsing, drying)?: A Lot Help from another person to put on and taking off regular upper body clothing?: A Lot Help from another person to put on and taking off regular lower body clothing?: A Lot 6 Click Score: 13   End of Session Nurse Communication: Mobility status  Activity Tolerance: Patient limited by fatigue;Patient limited by lethargy Patient left: in bed;with call bell/phone within reach;with bed alarm set  OT Visit Diagnosis: Unsteadiness on feet (R26.81);Other abnormalities of gait and mobility (R26.89);Muscle weakness (generalized) (M62.81);Other symptoms and signs involving cognitive function;Adult, failure to thrive (R62.7)                Time: 0300-9233 OT Time Calculation (min): 13 min Charges:  OT General Charges $OT Visit: 1 Visit OT Evaluation $OT Eval Moderate Complexity: 1 Mod  Corinne Ports E. Yajaira Doffing, OTR/L Acute Rehabilitation Services [phone]   Ascencion Dike 09/07/2022, 2:39 PM

## 2022-09-07 NOTE — Progress Notes (Signed)
CHART NOTE This note is regarding my discussion with Dr. Cruzita Lederer the hospitalist taking care of Gail Jennings.  The patient has a history of stage IV non-small cell lung cancer, adenocarcinoma with positive KRAS G12C mutation and PD-L1 expression of 5%.  She started palliative systemic chemotherapy with reduced dose carboplatin and Alimta in addition to immunotherapy with nivolumab and ipilimumab status post 1 cycle.  She was seen 1 week after the start of the treatment and she was feeling fine but she had recurrent right pleural effusion.  She initially declined to have a Pleurx catheter placement and requested to have frequent ultrasound guided thoracentesis which increased her risk for pneumothorax as well as empyema.  The patient was admitted to the hospital with suspicious empyema involving the right pleural space and now she has a chest tube placed by interventional radiology.  She is currently covered with antibiotics. I did message Dr. Cruzita Lederer and asked him if there is anything I can do to help during her hospitalization.  He indicated that there is nothing needed at this point.  A discussion with the patient about future treatment plan will be done on outpatient basis after discharge and improvement of her infection.  We will delay her next treatment until resolution of the infection and the empyema.  I may discontinue the chemotherapy portion of her treatment and proceed with just immunotherapy once she is feeling better.  A discussion with the palliative care team during her hospitalization regarding her goals of care is also advisable. I will be happy to see the patient during her hospitalization if I can add anything to her care as inpatient.  Otherwise she will be seen on outpatient basis for more discussion of her treatment options. Thank you for taking good care of [name].  Please call if you have any questions.

## 2022-09-07 NOTE — Progress Notes (Signed)
OT Cancellation Note  Patient Details Name: Gail Jennings MRN: [id number] DOB: 1933-05-21   Cancelled Treatment:    Reason Eval/Treat Not Completed: Patient declined, no reason specified Patient requesting OT come back when her friend is present for evaluation stating, "I have too much going on to deal with you right now". Attempted to ask patient if palliative had stated a time of their visit, but patient was unaware. OT will follow back as time permits.   Corinne Ports E. Makaelyn Aponte, OTR/L Acute Rehabilitation Services [place] 09/07/2022, 8:52 AM

## 2022-09-08 ENCOUNTER — Ambulatory Visit: Payer: Medicare Other

## 2022-09-08 ENCOUNTER — Inpatient Hospital Stay (HOSPITAL_COMMUNITY): Payer: Medicare Other

## 2022-09-08 ENCOUNTER — Ambulatory Visit: Payer: Medicare Other | Admitting: Internal Medicine

## 2022-09-08 ENCOUNTER — Other Ambulatory Visit: Payer: Medicare Other

## 2022-09-08 DIAGNOSIS — Z7189 Other specified counseling: Secondary | ICD-10-CM | POA: Diagnosis not present

## 2022-09-08 DIAGNOSIS — J910 Malignant pleural effusion: Secondary | ICD-10-CM

## 2022-09-08 DIAGNOSIS — J869 Pyothorax without fistula: Secondary | ICD-10-CM | POA: Diagnosis not present

## 2022-09-08 DIAGNOSIS — C3491 Malignant neoplasm of unspecified part of right bronchus or lung: Secondary | ICD-10-CM | POA: Diagnosis not present

## 2022-09-08 DIAGNOSIS — Z515 Encounter for palliative care: Secondary | ICD-10-CM | POA: Diagnosis not present

## 2022-09-08 DIAGNOSIS — Z66 Do not resuscitate: Secondary | ICD-10-CM

## 2022-09-08 LAB — COMPREHENSIVE METABOLIC PANEL
ALT: 14 U/L (ref 0–44)
AST: 19 U/L (ref 15–41)
Albumin: 1.6 g/dL — ABNORMAL LOW (ref 3.5–5.0)
Alkaline Phosphatase: 63 U/L (ref 38–126)
Anion gap: 11 (ref 5–15)
BUN: 26 mg/dL — ABNORMAL HIGH (ref 8–23)
CO2: 20 mmol/L — ABNORMAL LOW (ref 22–32)
Calcium: 8.2 mg/dL — ABNORMAL LOW (ref 8.9–10.3)
Chloride: 101 mmol/L (ref 98–111)
Creatinine, Ser: 0.66 mg/dL (ref 0.44–1.00)
GFR, Estimated: >60 mL/min (ref >60)
Glucose, Bld: 112 mg/dL — ABNORMAL HIGH (ref 70–99)
Potassium: 3.3 mmol/L — ABNORMAL LOW (ref 3.5–5.1)
Sodium: 132 mmol/L — ABNORMAL LOW (ref 135–145)
Total Bilirubin: 0.6 mg/dL (ref 0.3–1.2)
Total Protein: 4.6 g/dL — ABNORMAL LOW (ref 6.5–8.1)

## 2022-09-08 LAB — CBC
HCT: 31 % — ABNORMAL LOW (ref 36.0–46.0)
Hemoglobin: 9.9 g/dL — ABNORMAL LOW (ref 12.0–15.0)
MCH: 30.5 pg (ref 26.0–34.0)
MCHC: 31.9 g/dL (ref 30.0–36.0)
MCV: 95.4 fL (ref 80.0–100.0)
Platelets: 805 10*3/uL — ABNORMAL HIGH (ref 150–400)
RBC: 3.25 MIL/uL — ABNORMAL LOW (ref 3.87–5.11)
RDW: 13.9 % (ref 11.5–15.5)
WBC: 13.9 10*3/uL — ABNORMAL HIGH (ref 4.0–10.5)
nRBC: 0 % (ref 0.0–0.2)

## 2022-09-08 LAB — HEPARIN LEVEL (UNFRACTIONATED): Heparin Unfractionated: 0.38 IU/mL (ref 0.30–0.70)

## 2022-09-08 LAB — MAGNESIUM: Magnesium: 1.7 mg/dL (ref 1.7–2.4)

## 2022-09-08 MED ORDER — AMOXICILLIN-POT CLAVULANATE 875-125 MG PO TABS
1.0000 | ORAL_TABLET | Freq: Two times a day (BID) | ORAL | Status: DC
  Administered 2022-09-09 – 2022-09-14 (×12): 1 via ORAL
  Filled 2022-09-08 (×13): qty 1

## 2022-09-08 MED ORDER — POTASSIUM CHLORIDE CRYS ER 20 MEQ PO TBCR
40.0000 meq | EXTENDED_RELEASE_TABLET | Freq: Once | ORAL | Status: AC
  Administered 2022-09-08: 40 meq via ORAL
  Filled 2022-09-08: qty 2

## 2022-09-08 NOTE — Progress Notes (Signed)
Chest tube removed per order.  Occlusive dressing applied.  CXR ordered.

## 2022-09-08 NOTE — Progress Notes (Signed)
Daily Progress Note   Patient Name: Gail Jennings       Date: 09/08/2022 DOB: [birth year]  Age: 89 y.o. MRN#: [id number] Attending Physician: Caren Griffins, MD Primary Care Physician: Burnard Bunting, MD Admit Date: 09/02/2022  Reason for Consultation/Follow-up: Establishing goals of care  Subjective: Feeling overwhelmed with decision making Dry mouth r/t oxygen  Length of Stay: 6  Current Medications: Scheduled Meds:   benzonatate  200 mg Oral TID   folic acid  1 mg Oral Daily   metoprolol tartrate  37.5 mg Oral BID   pneumococcal 20-valent conjugate vaccine  0.5 mL Intramuscular Tomorrow-1000   potassium chloride  40 mEq Oral Once   senna-docusate  2 tablet Oral BID   sodium chloride flush  10 mL Intrapleural Q8H    Continuous Infusions:  ampicillin-sulbactam (UNASYN) IV 3 g (09/08/22 0634)   heparin 1,100 Units/hr (09/08/22 0016)    PRN Meds: hydrOXYzine, levalbuterol, melatonin, metoprolol tartrate, prochlorperazine  Physical Exam Constitutional:      General: She is not in acute distress.    Appearance: She is ill-appearing.  Pulmonary:     Effort: Pulmonary effort is normal.  Skin:    General: Skin is warm and dry.  Neurological:     Mental Status: She is alert and oriented to person, place, and time.             Vital Signs: BP 124/87 (BP Location: Right Arm)   Pulse (!) 110   Temp 98.2 F (36.8 C) (Oral)   Resp 19   Ht 5\' 4"  (1.626 m)   Wt 48.7 kg   SpO2 96%   BMI 18.43 kg/m  SpO2: SpO2: 96 % O2 Device: O2 Device: Nasal Cannula O2 Flow Rate: O2 Flow Rate (L/min): 3 L/min  Intake/output summary: No intake or output data in the 24 hours ending 09/08/22 1118 LBM: Last BM Date : 09/07/22 Baseline Weight: Weight: 45.4 kg Most recent weight: Weight: 48.7  kg       Palliative Assessment/Data: PPS 30%      Patient Active Problem List   Diagnosis Date Noted   Empyema ([place]) 09/02/2022   Goals of care, counseling/discussion 08/10/2022   Primary lung adenocarcinoma, right ([place]) 07/17/2022   Atrial fibrillation with RVR ([place]) 07/09/2022   Respiratory distress 07/09/2022   Pleural effusion on right 07/09/2022   Essential hypertension 07/09/2022   Chronic hyponatremia 07/09/2022   Paroxysmal A-fib ([facility])    History of left indirect inguinal hernia and lap repair left femoral hernia 11/22/2012    Palliative Care Assessment & Plan   HPI: 89 y.o. female  with past medical history of HTN, PAF on Eliquis, and metastatic adenocarcinoma of the right lung with recurrent malignant effusion admitted on 09/02/2022 with shortness of breath, right middle lower lobe pneumonia as well as loculated pleural effusion on the right. IR placed chest tube 12/31. Receiving antibiotic therapy. Follows with Dr. Inda Merlin for metastatic lung cancer. PMT consulted to discuss [place].    Assessment: Family meeting with morning with myself, Dr. Tamala Julian, Sr. Cruzita Lederer, Dr. Tommy Medal, patient advocate/friend [name], niece/HCPOA [name] (via telephone), and daughter Stanton Kidney (via telephone).   Clinical situation reviewed in detail with  family including discussion of her cancer and infection as well as limited options for management - concerns about risks of procedures and quality of life discussed. Hospice discussed.   Patient is clear she does not want to go to SNF. She is trying to decide between going home with 24/7 care and hospice vs going to her niece's in [place]. Undecided at this point.   Spoke separately to daughter and niece at length - they support whatever patient decides. All questions and concerns addressed.  Came back to patient's room later for further discussion - discussed options she has. She tells me she needs more time to consider options and make a  decision.   Recommendations/Plan: Ongoing goals of care discussions PMT will continue to follow and assist patient and family with decision making Of note, document has been uploaded naming niece [name] as Economist, daughter secondary  Code Status: DNR  Discharge Planning: To Be Determined  Care plan was discussed with patient, medical care team, multiple friends/family  Thank you for allowing the Palliative Medicine Team to assist in the care of this patient.  *Please note that this is a verbal dictation therefore any spelling or grammatical errors are due to the "[place] [employer]" system interpretation.  Juel Burrow, DNP, [facility] Palliative Medicine Team Team Phone # [phone]  Pager [phone]

## 2022-09-08 NOTE — Progress Notes (Signed)
PROGRESS NOTE  Gail Jennings NID:[id number] DOB: [birth year] DOA: 09/02/2022 PCP: Burnard Bunting, MD   LOS: 6 days   Brief Narrative / Interim history: 89 year old female with history of HTN, PAF on Eliquis, metastatic adenocarcinoma of the right lung with recurrent malignant effusion comes into the hospital with shortness of breath, right middle lower lobe pneumonia as well as loculated pleural effusion on the right.  She presented to [facility] and transferred to [facility] for cardiothoracic surgery evaluation, but upon arrival to [facility] this was deferred to IR who placed the chest tube on 12/31.  Unfortunately cell count was not sent and cultures are pending  Subjective / 24h Interval events: She just does not feel well this morning, tells me that she "wanted to die".  She is bothered by the chest tube.  She denies any nausea or vomiting  Assesement and Plan: Principal Problem:   Empyema ([place]) Active Problems:   Essential hypertension   Primary lung adenocarcinoma, right ([place])   Goals of care, counseling/discussion  Principal problem Loculated pleural effusion, status post chest tube - this was placed by IR on 12/31, PCCM consulted and following, status post lytics 09/07/2022.  Discussion with Dr. Tamala Julian and family at bedside, little to no output, chest tube to be removed today and she will need prolonged antibiotics  Active problems Right middle and lower lobe pneumonia-noted on imaging, antibiotics per ID, currently on Unasyn  Metastatic adenocarcinoma of the right lung, malignant pleural effusion-follows with Dr. Julien Nordmann as an outpatient.  Treatment is on hold due to acute infection.  I have consulted palliative care as overall she has a very poor prognosis with stage IV cancer and active infection  Aortic arch moral thrombus-she is already on anticoagulation for PAF, currently on heparin while chest tube is in place  Acute hypoxic respiratory failure-due to  pneumonia, recurrent pleural effusion  PAF with RVR-anticoagulated with heparin, increase metoprolol due to persistent elevation of her heart rate  Thrombocytosis-likely reactive  Severe protein malnutrition-BMI 18, due to malignancy  Essential hypertension-continue metoprolol, blood pressure is stable  Scheduled Meds:  benzonatate  200 mg Oral TID   folic acid  1 mg Oral Daily   metoprolol tartrate  37.5 mg Oral BID   pneumococcal 20-valent conjugate vaccine  0.5 mL Intramuscular Tomorrow-1000   senna-docusate  2 tablet Oral BID   sodium chloride flush  10 mL Intrapleural Q8H   Continuous Infusions:  ampicillin-sulbactam (UNASYN) IV 3 g (09/08/22 0634)   heparin 1,100 Units/hr (09/08/22 0016)   PRN Meds:.hydrOXYzine, levalbuterol, melatonin, metoprolol tartrate, prochlorperazine  Current Outpatient Medications  Medication Instructions   apixaban (ELIQUIS) 2.5 mg, Oral, 2 times daily   folic acid (FOLVITE) 1 mg, Oral, Daily   metoprolol tartrate (LOPRESSOR) 12.5 mg, Oral, 2 times daily   ondansetron (ZOFRAN) 8 mg, Oral, Every 8 hours PRN, Start on day 3 after carboplatin.   prochlorperazine (COMPAZINE) 10 mg, Oral, Every 6 hours PRN    Diet Orders (From admission, onward)     Start     Ordered   09/04/22 1137  Diet regular Room service appropriate? Yes; Fluid consistency: Thin  Diet effective now       Question Answer Comment  Room service appropriate? Yes   Fluid consistency: Thin      09/04/22 1136            DVT prophylaxis: SCDs Start: 09/02/22 2016   Lab Results  Component Value Date   PLT 805 (H)  09/08/2022      Code Status: DNR  Family Communication: Conference discussion with [name] at bedside, palliative care, PCCM and ID team along with patient's family over the phone  Status is: Inpatient  Remains inpatient appropriate because: Chest tube in place  Level of care: Telemetry Medical  Consultants:  TCS IR ID  Objective: Vitals:    09/07/22 1707 09/07/22 1900 09/07/22 2355 09/08/22 0510  BP: (!) 147/104 (!) 153/98 130/73 124/87  Pulse: (!) 139 81 (!) 106 (!) 110  Resp: 18 18  19   Temp: 97.6 F (36.4 C) 97.7 F (36.5 C) 97.7 F (36.5 C) 98.2 F (36.8 C)  TempSrc: Oral Oral Oral Oral  SpO2: 100% 98% 99% 96%  Weight:      Height:       No intake or output data in the 24 hours ending 09/08/22 1018  Wt Readings from Last 3 Encounters:  09/02/22 48.7 kg  08/23/22 47.8 kg  08/17/22 46 kg    Examination: Constitutional: NAD Eyes: lids and conjunctivae normal, no scleral icterus ENMT: mmm Neck: normal, supple Respiratory: clear to auscultation bilaterally, no wheezing, no crackles. Normal respiratory effort.  Cardiovascular: Regular rate and rhythm, no murmurs / rubs / gallops. No LE edema. Abdomen: soft, no distention, no tenderness. Bowel sounds positive.  Skin: no rashes Neurologic: no focal deficits, equal strength  Data Reviewed: I have independently reviewed following labs and imaging studies   CBC Recent Labs  Lab 09/02/22 1400 09/03/22 0356 09/04/22 0655 09/05/22 0212 09/06/22 0313 09/07/22 0209 09/08/22 0227  WBC 7.4   < > 8.9 10.6* 13.3* 15.6* 13.9*  HGB 10.6*   < > 10.6* 11.1* 10.7* 10.7* 9.9*  HCT 32.0*   < > 31.5* 33.3* 32.0* 33.1* 31.0*  PLT 632*   < > 777* 874* 823* 882* 805*  MCV 93.8   < > 91.6 92.2 92.0 95.1 95.4  MCH 31.1   < > 30.8 30.7 30.7 30.7 30.5  MCHC 33.1   < > 33.7 33.3 33.4 32.3 31.9  RDW 13.1   < > 13.1 13.3 13.4 13.8 13.9  LYMPHSABS 0.4*  --   --   --   --   --   --   MONOABS 1.8*  --   --   --   --   --   --   EOSABS 0.0  --   --   --   --   --   --   BASOSABS 0.0  --   --   --   --   --   --    < > = values in this interval not displayed.     Recent Labs  Lab 09/02/22 1319 09/02/22 1400 09/02/22 2121 09/04/22 1600 09/05/22 0212 09/08/22 0227  NA 130*  --   --  131* 131* 132*  K 4.7  --   --  3.9 3.8 3.3*  CL 95*  --   --  100 101 101  CO2 24  --    --  18* 19* 20*  GLUCOSE 110*  --   --  109* 100* 112*  BUN 16  --   --  15 15 26*  CREATININE 0.58  --   --  0.64 0.60 0.66  CALCIUM 8.5*  --   --  8.1* 8.0* 8.2*  AST 20  --   --   --   --  19  ALT 13  --   --   --   --  14  ALKPHOS 75  --   --   --   --  63  BILITOT 1.0  --   --   --   --  0.6  ALBUMIN 2.2*  --   --   --   --  1.6*  MG  --   --   --  1.6* 1.6* 1.7  INR 1.0  --  1.2  --   --   --   BNP  --  120.5*  --   --   --   --      ------------------------------------------------------------------------------------------------------------------ No results for input(s): "CHOL", "HDL", "LDLCALC", "TRIG", "CHOLHDL", "LDLDIRECT" in the last 72 hours.  No results found for: "HGBA1C" ------------------------------------------------------------------------------------------------------------------ No results for input(s): "TSH", "T4TOTAL", "T3FREE", "THYROIDAB" in the last 72 hours.  Invalid input(s): "FREET3"  Cardiac Enzymes No results for input(s): "CKMB", "TROPONINI", "MYOGLOBIN" in the last 168 hours.  Invalid input(s): "CK" ------------------------------------------------------------------------------------------------------------------    Component Value Date/Time   BNP 120.5 (H) 09/02/2022 1400    CBG: No results for input(s): "GLUCAP" in the last 168 hours.  Recent Results (from the past 240 hour(s))  Resp panel by RT-PCR (RSV, Flu A&B, Covid) Anterior Nasal Swab     Status: None   Collection Time: 09/02/22  1:22 PM   Specimen: Anterior Nasal Swab  Result Value Ref Range Status   SARS Coronavirus 2 by RT PCR NEGATIVE NEGATIVE Final    Comment: (NOTE) SARS-CoV-2 target nucleic acids are NOT DETECTED.  The SARS-CoV-2 RNA is generally detectable in upper respiratory specimens during the acute phase of infection. The lowest concentration of SARS-CoV-2 viral copies this assay can detect is 138 copies/mL. A negative result does not preclude SARS-Cov-2 infection  and should not be used as the sole basis for treatment or other patient management decisions. A negative result may occur with  improper specimen collection/handling, submission of specimen other than nasopharyngeal swab, presence of viral mutation(s) within the areas targeted by this assay, and inadequate number of viral copies(<138 copies/mL). A negative result must be combined with clinical observations, patient history, and epidemiological information. The expected result is Negative.  Fact Sheet for Patients:  EntrepreneurPulse.com.au  Fact Sheet for Healthcare Providers:  IncredibleEmployment.be  This test is no t yet approved or cleared by the [place] FDA and  has been authorized for detection and/or diagnosis of SARS-CoV-2 by FDA under an Emergency Use Authorization (EUA). This EUA will remain  in effect (meaning this test can be used) for the duration of the COVID-19 declaration under Section 564(b)(1) of the Act, 21 U.S.C.section 360bbb-3(b)(1), unless the authorization is terminated  or revoked sooner.       Influenza A by PCR NEGATIVE NEGATIVE Final   Influenza B by PCR NEGATIVE NEGATIVE Final    Comment: (NOTE) The Xpert Xpress SARS-CoV-2/FLU/RSV plus assay is intended as an aid in the diagnosis of influenza from Nasopharyngeal swab specimens and should not be used as a sole basis for treatment. Nasal washings and aspirates are unacceptable for Xpert Xpress SARS-CoV-2/FLU/RSV testing.  Fact Sheet for Patients: EntrepreneurPulse.com.au  Fact Sheet for Healthcare Providers: IncredibleEmployment.be  This test is not yet approved or cleared by the [place] FDA and has been authorized for detection and/or diagnosis of SARS-CoV-2 by FDA under an Emergency Use Authorization (EUA). This EUA will remain in effect (meaning this test can be used) for the duration of the COVID-19 declaration  under Section 564(b)(1) of the Act, 21 U.S.C. section 360bbb-3(b)(1), unless the  authorization is terminated or revoked.     Resp Syncytial Virus by PCR NEGATIVE NEGATIVE Final    Comment: (NOTE) Fact Sheet for Patients: EntrepreneurPulse.com.au  Fact Sheet for Healthcare Providers: IncredibleEmployment.be  This test is not yet approved or cleared by the [place] FDA and has been authorized for detection and/or diagnosis of SARS-CoV-2 by FDA under an Emergency Use Authorization (EUA). This EUA will remain in effect (meaning this test can be used) for the duration of the COVID-19 declaration under Section 564(b)(1) of the Act, 21 U.S.C. section 360bbb-3(b)(1), unless the authorization is terminated or revoked.  Performed at [facility], [place] [address] [place], [place] [address]   Surgical PCR screen     Status: None   Collection Time: 09/03/22  6:03 AM   Specimen: Nasal Mucosa; Nasal Swab  Result Value Ref Range Status   MRSA, PCR NEGATIVE NEGATIVE Final   Staphylococcus aureus NEGATIVE NEGATIVE Final    Comment: (NOTE) The Xpert SA Assay (FDA approved for NASAL specimens in patients 32 years of age and older), is one component of a comprehensive surveillance program. It is not intended to diagnose infection nor to guide or monitor treatment. Performed at [facility], [place] [address], [place], [place] [address]   Aerobic/Anaerobic Culture w Gram Stain (surgical/deep wound)     Status: None (Preliminary result)   Collection Time: 09/04/22 10:44 AM   Specimen: Pleural Fluid  Result Value Ref Range Status   Specimen Description PLEURAL  Final   Special Requests RIGHT  Final   Gram Stain NO ORGANISMS SEEN NO WBC SEEN   Final   Culture   Final    NO GROWTH 4 DAYS NO ANAEROBES ISOLATED; CULTURE IN PROGRESS FOR 5 DAYS Performed at [facility], [place] [address], [place], [place] [address]    Report  Status PENDING  Incomplete    Radiology Studies: CT CHEST WO CONTRAST  Result Date: 09/07/2022 CLINICAL DATA:  Empyema follow-up.  History of lung cancer. EXAM: CT CHEST WITHOUT CONTRAST TECHNIQUE: Multidetector CT imaging of the chest was performed following the standard protocol without IV contrast. RADIATION DOSE REDUCTION: This exam was performed according to the departmental dose-optimization program which includes automated exposure control, adjustment of the mA and/or kV according to patient size and/or use of iterative reconstruction technique. COMPARISON:  Most recent CT 09/02/2022, PET CT 07/29/2022 FINDINGS: Cardiovascular: Aortic atherosclerosis with tortuosity. The heart is normal in size. No significant pericardial effusion. Occasional coronary artery calcifications. Mediastinum/Nodes: Diffuse fluid distention of the esophagus to the thoracic inlet. No convincing esophageal wall thickening. No bulky mediastinal adenopathy. Assessment of the previous 14 mm right hilar node is limited in the absence of IV contrast on the current exam. Lungs/Pleura: Placement of pigtail catheter in the loculated right pleural effusion. Amount of pleural fluid has diminished after catheter placement. Heterogeneous air within this collection has increased and may be use related to lung catheter placement. Small amount of extrapleural air tracks medially into the right lung apex. Right pleural thickening is less well-defined the absence of IV contrast. Dense masslike consolidation in the right lower lobe with additional masslike opacity in the right middle lobe. Patchy consolidation within the right upper and middle lobes. Mild right lung volume loss. Small left pleural effusion which is new. Associated compressive atelectasis in the dependent left lung. Faint ground-glass opacities in the left apical and lateral upper lobe. Mild underlying emphysema. Trachea and central airways are patent. Upper Abdomen: Fluid  collection posterior to the right lobe of the liver is unchanged from recent exam. No acute upper abdominal findings. Musculoskeletal: The right chest wall fluid collection is poorly defined in the absence of IV contrast. There is generalized chest wall edema dependently. No acute osseous findings. IMPRESSION: 1. Placement of pigtail catheter in the loculated right pleural effusion. Amount of pleural fluid has diminished after catheter placement. Heterogeneous air within this collection has increased and may be use related to lung catheter placement. 2. Dense masslike consolidation in the right lower lobe with additional masslike opacity in the right middle lobe. Patchy consolidation within the right upper and middle lobes. 3. Small left pleural effusion which is new. Associated compressive atelectasis in the dependent left lung. 4. Diffuse fluid distention of the esophagus to the thoracic inlet, can be seen with reflux or dysmotility. 5. Chest wall fluid collection is poorly defined in the absence of IV contrast on the current exam. Aortic Atherosclerosis (ICD10-I70.0). Electronically Signed   By: Keith Rake M.D.   On: 09/07/2022 23:22   DG CHEST PORT 1 VIEW  Result Date: 09/07/2022 CLINICAL DATA:  Chest tube EXAM: PORTABLE CHEST 1 VIEW COMPARISON:  09/06/2022 FINDINGS: Unchanged AP portable chest radiograph. Right-sided pigtail chest tube remains in position about the medial right lung base, with a small, loculated appearing right pleural effusion. Unchanged heterogeneous and interstitial opacity of the right lung base. Left lung is normally aerated. Mild cardiomegaly. Osseous structures unremarkable. IMPRESSION: 1. Right-sided pigtail chest tube remains in position about the medial right lung base, with a small, loculated appearing right pleural effusion. 2. Unchanged heterogeneous and interstitial opacity about the right lung base. 3. Mild cardiomegaly. Electronically Signed   By: Delanna Ahmadi M.D.    On: 09/07/2022 13:45    Marzetta Board, MD, PhD [facility]  Between 7 am - 7 pm I am available, please contact me via Amion (for emergencies) or Securechat (non urgent messages)  Between 7 pm - 7 am I am not available, please contact night coverage MD/APP via Amion

## 2022-09-08 NOTE — Plan of Care (Signed)

## 2022-09-08 NOTE — Progress Notes (Signed)
Subjective:  Patient very distressed re her condition   Antibiotics:  Anti-infectives (From admission, onward)    Start     Dose/Rate Route Frequency Ordered Stop   09/07/22 1300  Ampicillin-Sulbactam (UNASYN) 3 g in sodium chloride 0.9 % 100 mL IVPB        3 g 200 mL/hr over 30 Minutes Intravenous Every 6 hours 09/07/22 0947     09/04/22 1800  vancomycin (VANCOREADY) IVPB 1250 mg/250 mL  Status:  Discontinued        1,250 mg 166.7 mL/hr over 90 Minutes Intravenous Every 48 hours 09/02/22 1756 09/07/22 0947   09/03/22 0100  ceFEPIme (MAXIPIME) 2 g in sodium chloride 0.9 % 100 mL IVPB  Status:  Discontinued        2 g 200 mL/hr over 30 Minutes Intravenous Every 12 hours 09/02/22 1756 09/07/22 0947   09/02/22 1630  vancomycin (VANCOCIN) IVPB 1000 mg/200 mL premix        1,000 mg 200 mL/hr over 60 Minutes Intravenous  Once 09/02/22 1624 09/02/22 1814   09/02/22 1630  piperacillin-tazobactam (ZOSYN) IVPB 3.375 g        3.375 g 100 mL/hr over 30 Minutes Intravenous  Once 09/02/22 1624 09/02/22 1732       Medications: Scheduled Meds:  benzonatate  200 mg Oral TID   folic acid  1 mg Oral Daily   metoprolol tartrate  37.5 mg Oral BID   pneumococcal 20-valent conjugate vaccine  0.5 mL Intramuscular Tomorrow-1000   senna-docusate  2 tablet Oral BID   sodium chloride flush  10 mL Intrapleural Q8H   Continuous Infusions:  ampicillin-sulbactam (UNASYN) IV 3 g (09/08/22 0634)   heparin 1,100 Units/hr (09/08/22 0016)   PRN Meds:.hydrOXYzine, levalbuterol, melatonin, metoprolol tartrate, prochlorperazine    Objective: Weight change:  No intake or output data in the 24 hours ending 09/08/22 1449 Blood pressure 106/75, pulse (!) 104, temperature 98.2 F (36.8 C), temperature source Oral, resp. rate 16, height 5\' 4"  (1.626 m), weight 48.7 kg, SpO2 98 %. Temp:  [97.6 F (36.4 C)-98.2 F (36.8 C)] 98.2 F (36.8 C) (01/04 0850) Pulse Rate:  [81-139] 104 (01/04  0850) Resp:  [16-19] 16 (01/04 0850) BP: (106-153)/(73-104) 106/75 (01/04 0850) SpO2:  [96 %-100 %] 98 % (01/04 0850)  Physical Exam: Physical Exam Constitutional:      Appearance: She is cachectic. She is ill-appearing.  HENT:     Head: Normocephalic.  Eyes:     General:        Right eye: No discharge.        Left eye: No discharge.     Extraocular Movements: Extraocular movements intact.     Pupils: Pupils are equal, round, and reactive to light.  Cardiovascular:     Rate and Rhythm: Normal rate and regular rhythm.  Pulmonary:     Effort: No respiratory distress.     Breath sounds: No wheezing.  Abdominal:     General: Abdomen is flat. There is no distension.     Palpations: Abdomen is soft. There is no mass.  Skin:    Coloration: Skin is pale.  Neurological:     General: No focal deficit present.     Mental Status: She is alert and oriented to person, place, and time.  Psychiatric:        Attention and Perception: Attention normal.        Mood and Affect: Mood is anxious and depressed.  Speech: Speech normal.        Behavior: Behavior normal.        Thought Content: Thought content normal.        Cognition and Memory: Cognition normal.        Judgment: Judgment normal.      CBC:    BMET Recent Labs    09/08/22 0227  NA 132*  K 3.3*  CL 101  CO2 20*  GLUCOSE 112*  BUN 26*  CREATININE 0.66  CALCIUM 8.2*     Liver Panel  Recent Labs    09/08/22 0227  PROT 4.6*  ALBUMIN 1.6*  AST 19  ALT 14  ALKPHOS 63  BILITOT 0.6       Sedimentation Rate No results for input(s): "ESRSEDRATE" in the last 72 hours. C-Reactive Protein No results for input(s): "CRP" in the last 72 hours.  Micro Results: Recent Results (from the past 720 hour(s))  Resp panel by RT-PCR (RSV, Flu A&B, Covid) Anterior Nasal Swab     Status: None   Collection Time: 09/02/22  1:22 PM   Specimen: Anterior Nasal Swab  Result Value Ref Range Status   SARS Coronavirus 2  by RT PCR NEGATIVE NEGATIVE Final    Comment: (NOTE) SARS-CoV-2 target nucleic acids are NOT DETECTED.  The SARS-CoV-2 RNA is generally detectable in upper respiratory specimens during the acute phase of infection. The lowest concentration of SARS-CoV-2 viral copies this assay can detect is 138 copies/mL. A negative result does not preclude SARS-Cov-2 infection and should not be used as the sole basis for treatment or other patient management decisions. A negative result may occur with  improper specimen collection/handling, submission of specimen other than nasopharyngeal swab, presence of viral mutation(s) within the areas targeted by this assay, and inadequate number of viral copies(<138 copies/mL). A negative result must be combined with clinical observations, patient history, and epidemiological information. The expected result is Negative.  Fact Sheet for Patients:  EntrepreneurPulse.com.au  Fact Sheet for Healthcare Providers:  IncredibleEmployment.be  This test is no t yet approved or cleared by the [place] FDA and  has been authorized for detection and/or diagnosis of SARS-CoV-2 by FDA under an Emergency Use Authorization (EUA). This EUA will remain  in effect (meaning this test can be used) for the duration of the COVID-19 declaration under Section 564(b)(1) of the Act, 21 U.S.C.section 360bbb-3(b)(1), unless the authorization is terminated  or revoked sooner.       Influenza A by PCR NEGATIVE NEGATIVE Final   Influenza B by PCR NEGATIVE NEGATIVE Final    Comment: (NOTE) The Xpert Xpress SARS-CoV-2/FLU/RSV plus assay is intended as an aid in the diagnosis of influenza from Nasopharyngeal swab specimens and should not be used as a sole basis for treatment. Nasal washings and aspirates are unacceptable for Xpert Xpress SARS-CoV-2/FLU/RSV testing.  Fact Sheet for Patients: EntrepreneurPulse.com.au  Fact  Sheet for Healthcare Providers: IncredibleEmployment.be  This test is not yet approved or cleared by the [place] FDA and has been authorized for detection and/or diagnosis of SARS-CoV-2 by FDA under an Emergency Use Authorization (EUA). This EUA will remain in effect (meaning this test can be used) for the duration of the COVID-19 declaration under Section 564(b)(1) of the Act, 21 U.S.C. section 360bbb-3(b)(1), unless the authorization is terminated or revoked.     Resp Syncytial Virus by PCR NEGATIVE NEGATIVE Final    Comment: (NOTE) Fact Sheet for Patients: EntrepreneurPulse.com.au  Fact Sheet for Healthcare Providers: IncredibleEmployment.be  This  test is not yet approved or cleared by the [place] and has been authorized for detection and/or diagnosis of SARS-CoV-2 by FDA under an Emergency Use Authorization (EUA). This EUA will remain in effect (meaning this test can be used) for the duration of the COVID-19 declaration under Section 564(b)(1) of the Act, 21 U.S.C. section 360bbb-3(b)(1), unless the authorization is terminated or revoked.  Performed at [facility], [place] [address], [place], [place] [address]   Surgical PCR screen     Status: None   Collection Time: 09/03/22  6:03 AM   Specimen: Nasal Mucosa; Nasal Swab  Result Value Ref Range Status   MRSA, PCR NEGATIVE NEGATIVE Final   Staphylococcus aureus NEGATIVE NEGATIVE Final    Comment: (NOTE) The Xpert SA Assay (FDA approved for NASAL specimens in patients 58 years of age and older), is one component of a comprehensive surveillance program. It is not intended to diagnose infection nor to guide or monitor treatment. Performed at [facility], [place] [address], [place],  [address]   Aerobic/Anaerobic Culture w Gram Stain (surgical/deep wound)     Status: None (Preliminary result)   Collection Time: 09/04/22 10:44  AM   Specimen: Pleural Fluid  Result Value Ref Range Status   Specimen Description PLEURAL  Final   Special Requests RIGHT  Final   Gram Stain NO ORGANISMS SEEN NO WBC SEEN   Final   Culture   Final    NO GROWTH 4 DAYS NO ANAEROBES ISOLATED; CULTURE IN PROGRESS FOR 5 DAYS Performed at [facility], [place] [address], [place],  [address]    Report Status PENDING  Incomplete    Studies/Results: CT CHEST WO CONTRAST  Result Date: 09/07/2022 CLINICAL DATA:  Empyema follow-up.  History of lung cancer. EXAM: CT CHEST WITHOUT CONTRAST TECHNIQUE: Multidetector CT imaging of the chest was performed following the standard protocol without IV contrast. RADIATION DOSE REDUCTION: This exam was performed according to the departmental dose-optimization program which includes automated exposure control, adjustment of the mA and/or kV according to patient size and/or use of iterative reconstruction technique. COMPARISON:  Most recent CT 09/02/2022, PET CT 07/29/2022 FINDINGS: Cardiovascular: Aortic atherosclerosis with tortuosity. The heart is normal in size. No significant pericardial effusion. Occasional coronary artery calcifications. Mediastinum/Nodes: Diffuse fluid distention of the esophagus to the thoracic inlet. No convincing esophageal wall thickening. No bulky mediastinal adenopathy. Assessment of the previous 14 mm right hilar node is limited in the absence of IV contrast on the current exam. Lungs/Pleura: Placement of pigtail catheter in the loculated right pleural effusion. Amount of pleural fluid has diminished after catheter placement. Heterogeneous air within this collection has increased and may be use related to lung catheter placement. Small amount of extrapleural air tracks medially into the right lung apex. Right pleural thickening is less well-defined the absence of IV contrast. Dense masslike consolidation in the right lower lobe with additional masslike opacity in the right middle  lobe. Patchy consolidation within the right upper and middle lobes. Mild right lung volume loss. Small left pleural effusion which is new. Associated compressive atelectasis in the dependent left lung. Faint ground-glass opacities in the left apical and lateral upper lobe. Mild underlying emphysema. Trachea and central airways are patent. Upper Abdomen: Fluid collection posterior to the right lobe of the liver is unchanged from recent exam. No acute upper abdominal findings. Musculoskeletal: The right chest wall fluid collection is poorly defined in the absence of IV contrast. There  is generalized chest wall edema dependently. No acute osseous findings. IMPRESSION: 1. Placement of pigtail catheter in the loculated right pleural effusion. Amount of pleural fluid has diminished after catheter placement. Heterogeneous air within this collection has increased and may be use related to lung catheter placement. 2. Dense masslike consolidation in the right lower lobe with additional masslike opacity in the right middle lobe. Patchy consolidation within the right upper and middle lobes. 3. Small left pleural effusion which is new. Associated compressive atelectasis in the dependent left lung. 4. Diffuse fluid distention of the esophagus to the thoracic inlet, can be seen with reflux or dysmotility. 5. Chest wall fluid collection is poorly defined in the absence of IV contrast on the current exam. Aortic Atherosclerosis (ICD10-I70.0). Electronically Signed   By: Keith Rake M.D.   On: 09/07/2022 23:22   DG CHEST PORT 1 VIEW  Result Date: 09/07/2022 CLINICAL DATA:  Chest tube EXAM: PORTABLE CHEST 1 VIEW COMPARISON:  09/06/2022 FINDINGS: Unchanged AP portable chest radiograph. Right-sided pigtail chest tube remains in position about the medial right lung base, with a small, loculated appearing right pleural effusion. Unchanged heterogeneous and interstitial opacity of the right lung base. Left lung is normally aerated.  Mild cardiomegaly. Osseous structures unremarkable. IMPRESSION: 1. Right-sided pigtail chest tube remains in position about the medial right lung base, with a small, loculated appearing right pleural effusion. 2. Unchanged heterogeneous and interstitial opacity about the right lung base. 3. Mild cardiomegaly. Electronically Signed   By: Delanna Ahmadi M.D.   On: 09/07/2022 13:45      Assessment/Plan:  INTERVAL HISTORY: patient   Principal Problem:   Empyema ([place]) Active Problems:   Essential hypertension   Primary lung adenocarcinoma, right ([place])   Goals of care, counseling/discussion    Gail Jennings is a 89 y.o. female with history of hypertension paroxysmal atrial fibrillation stage IV adenocarcinoma of the lung diagnosed in November with recurrent malignant pleural effusions despite recent low-dose chemotherapy now admitted with recurrence of her effusion with likely bacterial superinfection.  #1 Recurrent malignant effusion with bacterial superinfection and loculation  CT surgery is not appropriate for her  Lytics are too dangerous  I agree with Dr. Thompson Caul idea of moving her to augmentin--which I will do today  She can be on this indefinitely as I do NOT see this resolving  While her chemo is with-held the effusions will recur and cancer which is terminal progress  #2 GOals of care: I would strongly encourage move to comfort care, palliative care with family  I spent 85minutes with the patient including than 50% of the time in face to face counseling of the patient and her daughter regarding her recurrent malignant effusion her lung cancer bacterial superinfection with loculation, regarding her goals of care,  along with review of medical records in preparation for the visit and during the visit and in coordination of her care with Dr. Tamala Julian, Dr. Cruzita Lederer and Kathie Rhodes, NP  I feel that I have given sufficient information to the patient and her advocate and the team at  present.  I will sign off for now and we would be happy to see her in clinic but I think again focus should be on comfort.      LOS: 6 days   Alcide Evener 09/08/2022, 2:49 PM

## 2022-09-08 NOTE — Progress Notes (Signed)
ANTICOAGULATION CONSULT NOTE  Pharmacy Consult for Heparin Indication: atrial fibrillation and aortic arch mural thrombus  Allergies  Allergen Reactions   Succinylcholine Chloride Other (See Comments)    Prolonged paralysis Brand name Anectine   Codeine Nausea And Vomiting   Hydrocodone Nausea And Vomiting    Patient Measurements: Height: 5\' 4"  (162.6 cm) Weight: 48.7 kg (107 lb 5.8 oz) IBW/kg (Calculated) : 54.7  Heparin Dosing Weight: 49 kg  Vital Signs: Temp: 98.2 F (36.8 C) (01/04 0510) Temp Source: Oral (01/04 0510) BP: 124/87 (01/04 0510) Pulse Rate: 110 (01/04 0510)  Labs: Recent Labs    09/06/22 0313 09/07/22 0209 09/07/22 0725 09/08/22 0227  HGB 10.7* 10.7*  --  9.9*  HCT 32.0* 33.1*  --  31.0*  PLT 823* 882*  --  805*  HEPARINUNFRC 0.42 0.19* 0.59 0.38  CREATININE  --   --   --  0.66     Estimated Creatinine Clearance: 38.1 mL/min (by C-G formula based on SCr of 0.66 mg/dL).   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 Pharmacy consulted to manage heparin.  Heparin level 0.38 units/mL which is therapeutic while on heparin 1100 units/hr. CBC Stable with no signs of bleeding.  Goal of Therapy:  Heparin level 0.3-0.7 units/ml Monitor platelets by anticoagulation protocol: Yes   Plan:  Continue heparin infusion at 1100 units/hr Check heparin level daily while on heparin Continue to monitor H&H and platelets   Thank you for allowing pharmacy to be a part of this patient's care.  Erskine Speed, PharmD Clinical Pharmacist

## 2022-09-08 NOTE — Progress Notes (Signed)
09/08/2022  I have seen and evaluated the patient for complex pleural space.  S:  Pretty miserable. Not eating much. Intermittent confusion.  O: Blood pressure 124/87, pulse (!) 110, temperature 98.2 F (36.8 C), temperature source Oral, resp. rate 19, height 5\' 4"  (1.626 m), weight 48.7 kg, SpO2 96 %.  Frail cachetic elderly woman in NAD +muscle wasting Minimal serous fluid out of tube  Breath sounds remain reduced R base  Anxious, depressed, confused.   CT chest reviewed: debris and thick pleural rind  A:  Complex possibly infected R pleural space Protein calorie malnutrition FTT Stage IV cancer  P:  I don't think pleural lytics will open up lung much here and run risk of hemorrhagic conversion.  Given now near bedbound state I think we need to try to sterile space with prolonged abx and focus on where Ms Shvartsman would like to live her remaining time.  Remove chest tube, 4-6 weeks abx, and determine whether she is open or not to hospice.  Discussed with patient, family, and primary, available PRN.  Erskine Emery MD [facility] Prefer epic messenger for cross cover needs If after hours, please call E-link

## 2022-09-08 NOTE — Social Work (Signed)
CSW acknowledges consult for SNF placement. At this time palliative care is following for [place]. CSW met with pt and health care advocate, [name]. CSW introduced herself and her role in the team. CSW acknowledged that the medical team is still following, and disposition is still uncertain. The pt expresses that she does not want to be placed at SNF, or any other facility. She states to CSW that she is dying and wants to return home or to her sisters house in [place]. Barriers and concerns were discussed. Healthcare advocate aware of this information as pt became drowsy. TOC will continue to follow for DC needs.

## 2022-09-09 DIAGNOSIS — J869 Pyothorax without fistula: Secondary | ICD-10-CM | POA: Diagnosis not present

## 2022-09-09 DIAGNOSIS — Z515 Encounter for palliative care: Secondary | ICD-10-CM | POA: Diagnosis not present

## 2022-09-09 DIAGNOSIS — Z7189 Other specified counseling: Secondary | ICD-10-CM | POA: Diagnosis not present

## 2022-09-09 DIAGNOSIS — C3491 Malignant neoplasm of unspecified part of right bronchus or lung: Secondary | ICD-10-CM | POA: Diagnosis not present

## 2022-09-09 LAB — BASIC METABOLIC PANEL
Anion gap: 9 (ref 5–15)
BUN: 22 mg/dL (ref 8–23)
CO2: 22 mmol/L (ref 22–32)
Calcium: 8.1 mg/dL — ABNORMAL LOW (ref 8.9–10.3)
Chloride: 103 mmol/L (ref 98–111)
Creatinine, Ser: 0.6 mg/dL (ref 0.44–1.00)
GFR, Estimated: >60 mL/min (ref >60)
Glucose, Bld: 109 mg/dL — ABNORMAL HIGH (ref 70–99)
Potassium: 3.6 mmol/L (ref 3.5–5.1)
Sodium: 134 mmol/L — ABNORMAL LOW (ref 135–145)

## 2022-09-09 LAB — MAGNESIUM: Magnesium: 1.7 mg/dL (ref 1.7–2.4)

## 2022-09-09 LAB — CBC
HCT: 31.1 % — ABNORMAL LOW (ref 36.0–46.0)
Hemoglobin: 10.2 g/dL — ABNORMAL LOW (ref 12.0–15.0)
MCH: 30.6 pg (ref 26.0–34.0)
MCHC: 32.8 g/dL (ref 30.0–36.0)
MCV: 93.4 fL (ref 80.0–100.0)
Platelets: 726 10*3/uL — ABNORMAL HIGH (ref 150–400)
RBC: 3.33 MIL/uL — ABNORMAL LOW (ref 3.87–5.11)
RDW: 14.2 % (ref 11.5–15.5)
WBC: 14.5 10*3/uL — ABNORMAL HIGH (ref 4.0–10.5)
nRBC: 0 % (ref 0.0–0.2)

## 2022-09-09 LAB — GLUCOSE, CAPILLARY
Glucose-Capillary: 110 mg/dL — ABNORMAL HIGH (ref 70–99)
Glucose-Capillary: 89 mg/dL (ref 70–99)
Glucose-Capillary: 104 mg/dL — ABNORMAL HIGH (ref 70–99)

## 2022-09-09 LAB — HEPARIN LEVEL (UNFRACTIONATED): Heparin Unfractionated: 0.68 IU/mL (ref 0.30–0.70)

## 2022-09-09 LAB — AEROBIC/ANAEROBIC CULTURE W GRAM STAIN (SURGICAL/DEEP WOUND)
Culture: NO GROWTH
Gram Stain: NONE SEEN

## 2022-09-09 MED ORDER — APIXABAN 5 MG PO TABS
5.0000 mg | ORAL_TABLET | Freq: Two times a day (BID) | ORAL | Status: DC
  Administered 2022-09-09 – 2022-09-14 (×12): 5 mg via ORAL
  Filled 2022-09-09 (×13): qty 1

## 2022-09-09 NOTE — Progress Notes (Signed)
PROGRESS NOTE  Gail Jennings MVH:[id number] DOB: 07-Aug-1933 DOA: 09/02/2022 PCP: Burnard Bunting, MD   LOS: 7 days   Brief Narrative / Interim history: 89 year old female with history of HTN, PAF on Eliquis, metastatic adenocarcinoma of the right lung with recurrent malignant effusion comes into the hospital with shortness of breath, right middle lower lobe pneumonia as well as loculated pleural effusion on the right.  She presented to [facility] and transferred to [facility] for cardiothoracic surgery evaluation, but upon arrival to [facility] this was deferred to IR who placed the chest tube on 12/31.  Unfortunately cell count was not sent and cultures are pending  Subjective / 24h Interval events: Overall better that the chest tube is out, still does not know what to do whether she should stay in the area or move with her family, on 1 side she has all the doctors here, on the other side there is no family in town  Assesement and Plan: Principal Problem:   [place]ema ([place]) Active Problems:   Essential hypertension   Primary lung adenocarcinoma, right ([place])   Goals of care, counseling/discussion   Malignant pleural effusion  Principal problem Loculated pleural effusion, status post chest tube - this was placed by IR on 12/31, PCCM consulted and following, status post lytics 09/07/2022.  Chest tube discontinued 1/4, she has been placed on prolonged antibiotics with Augmentin  Active problems Right middle and lower lobe pneumonia-noted on imaging, antibiotics per ID, currently on Augmentin  Metastatic adenocarcinoma of the right lung, malignant pleural effusion-follows with Dr. Julien Nordmann as an outpatient.  Treatment is on hold due to acute infection.  I have consulted palliative care as overall she has a very poor prognosis with stage IV cancer and active infection  Aortic arch moral thrombus-she is already on anticoagulation for PAF, chest tube discontinued, heparin will be  transition to Eliquis  Acute hypoxic respiratory failure-due to pneumonia, recurrent pleural effusion.  Likely needs oxygen on discharge  PAF with RVR-anticoagulated with heparin, increase metoprolol due to persistent elevation of her heart rate  Thrombocytosis-likely reactive  Severe protein malnutrition-BMI 18, due to malignancy  Essential hypertension-continue metoprolol, blood pressure is stable  Scheduled Meds:  amoxicillin-clavulanate  1 tablet Oral Q12H   apixaban  5 mg Oral BID   benzonatate  200 mg Oral TID   folic acid  1 mg Oral Daily   metoprolol tartrate  37.5 mg Oral BID   pneumococcal 20-valent conjugate vaccine  0.5 mL Intramuscular Tomorrow-1000   senna-docusate  2 tablet Oral BID   sodium chloride flush  10 mL Intrapleural Q8H   Continuous Infusions:   PRN Meds:.hydrOXYzine, levalbuterol, melatonin, metoprolol tartrate, prochlorperazine  Current Outpatient Medications  Medication Instructions   apixaban (ELIQUIS) 2.5 mg, Oral, 2 times daily   folic acid (FOLVITE) 1 mg, Oral, Daily   metoprolol tartrate (LOPRESSOR) 12.5 mg, Oral, 2 times daily   ondansetron (ZOFRAN) 8 mg, Oral, Every 8 hours PRN, Start on day 3 after carboplatin.   prochlorperazine (COMPAZINE) 10 mg, Oral, Every 6 hours PRN    Diet Orders (From admission, onward)     Start     Ordered   09/04/22 1137  Diet regular Room service appropriate? Yes; Fluid consistency: Thin  Diet effective now       Question Answer Comment  Room service appropriate? Yes   Fluid consistency: Thin      09/04/22 1136            DVT prophylaxis:  SCDs Start: 09/02/22 2016 apixaban (ELIQUIS) tablet 5 mg   Lab Results  Component Value Date   PLT 726 (H) 09/09/2022      Code Status: DNR  Family Communication: No family at bedside this morning  Status is: Inpatient  Remains inpatient appropriate because: Chest tube in place  Level of care: Telemetry Medical  Consultants:   TCS IR ID  Objective: Vitals:   09/08/22 1827 09/08/22 2039 09/09/22 0621 09/09/22 0817  BP: 108/74 91/80 (!) 140/71 107/79  Pulse: (!) 108 86 (!) 108 (!) 107  Resp: 16 16 18 18   Temp: 97.7 F (36.5 C) 97.9 F (36.6 C) 98.2 F (36.8 C) 98.1 F (36.7 C)  TempSrc: Oral Oral Oral Oral  SpO2: 100% 93% 98% 100%  Weight:      Height:        Intake/Output Summary (Last 24 hours) at 09/09/2022 1046 Last data filed at 09/08/2022 2242 Gross per 24 hour  Intake 240 ml  Output --  Net 240 ml    Wt Readings from Last 3 Encounters:  09/02/22 48.7 kg  08/23/22 47.8 kg  08/17/22 46 kg    Examination: Constitutional: NAD Eyes: lids and conjunctivae normal, no scleral icterus ENMT: mmm Neck: normal, supple Respiratory: clear to auscultation bilaterally, no wheezing, no crackles. Normal respiratory effort.  Cardiovascular: Regular rate and rhythm, no murmurs / rubs / gallops. No LE edema. Abdomen: soft, no distention, no tenderness. Bowel sounds positive.   Data Reviewed: I have independently reviewed following labs and imaging studies   CBC Recent Labs  Lab 09/02/22 1400 09/03/22 0356 09/05/22 0212 09/06/22 0313 09/07/22 0209 09/08/22 0227 09/09/22 0412  WBC 7.4   < > 10.6* 13.3* 15.6* 13.9* 14.5*  HGB 10.6*   < > 11.1* 10.7* 10.7* 9.9* 10.2*  HCT 32.0*   < > 33.3* 32.0* 33.1* 31.0* 31.1*  PLT 632*   < > 874* 823* 882* 805* 726*  MCV 93.8   < > 92.2 92.0 95.1 95.4 93.4  MCH 31.1   < > 30.7 30.7 30.7 30.5 30.6  MCHC 33.1   < > 33.3 33.4 32.3 31.9 32.8  RDW 13.1   < > 13.3 13.4 13.8 13.9 14.2  LYMPHSABS 0.4*  --   --   --   --   --   --   MONOABS 1.8*  --   --   --   --   --   --   EOSABS 0.0  --   --   --   --   --   --   BASOSABS 0.0  --   --   --   --   --   --    < > = values in this interval not displayed.     Recent Labs  Lab 09/02/22 1319 09/02/22 1400 09/02/22 2121 09/04/22 1600 09/05/22 0212 09/08/22 0227 09/09/22 0412  NA 130*  --   --  131* 131*  132* 134*  K 4.7  --   --  3.9 3.8 3.3* 3.6  CL 95*  --   --  100 101 101 103  CO2 24  --   --  18* 19* 20* 22  GLUCOSE 110*  --   --  109* 100* 112* 109*  BUN 16  --   --  15 15 26* 22  CREATININE 0.58  --   --  0.64 0.60 0.66 0.60  CALCIUM 8.5*  --   --  8.1*  8.0* 8.2* 8.1*  AST 20  --   --   --   --  19  --   ALT 13  --   --   --   --  14  --   ALKPHOS 75  --   --   --   --  63  --   BILITOT 1.0  --   --   --   --  0.6  --   ALBUMIN 2.2*  --   --   --   --  1.6*  --   MG  --   --   --  1.6* 1.6* 1.7 1.7  INR 1.0  --  1.2  --   --   --   --   BNP  --  120.5*  --   --   --   --   --      ------------------------------------------------------------------------------------------------------------------ No results for input(s): "CHOL", "HDL", "LDLCALC", "TRIG", "CHOLHDL", "LDLDIRECT" in the last 72 hours.  No results found for: "HGBA1C" ------------------------------------------------------------------------------------------------------------------ No results for input(s): "TSH", "T4TOTAL", "T3FREE", "THYROIDAB" in the last 72 hours.  Invalid input(s): "FREET3"  Cardiac Enzymes No results for input(s): "CKMB", "TROPONINI", "MYOGLOBIN" in the last 168 hours.  Invalid input(s): "CK" ------------------------------------------------------------------------------------------------------------------    Component Value Date/Time   BNP 120.5 (H) 09/02/2022 1400    CBG: Recent Labs  Lab 09/09/22 0829  GLUCAP 110*    Recent Results (from the past 240 hour(s))  Resp panel by RT-PCR (RSV, Flu A&B, Covid) Anterior Nasal Swab     Status: None   Collection Time: 09/02/22  1:22 PM   Specimen: Anterior Nasal Swab  Result Value Ref Range Status   SARS Coronavirus 2 by RT PCR NEGATIVE NEGATIVE Final    Comment: (NOTE) SARS-CoV-2 target nucleic acids are NOT DETECTED.  The SARS-CoV-2 RNA is generally detectable in upper respiratory specimens during the acute phase of infection. The  lowest concentration of SARS-CoV-2 viral copies this assay can detect is 138 copies/mL. A negative result does not preclude SARS-Cov-2 infection and should not be used as the sole basis for treatment or other patient management decisions. A negative result may occur with  improper specimen collection/handling, submission of specimen other than nasopharyngeal swab, presence of viral mutation(s) within the areas targeted by this assay, and inadequate number of viral copies(<138 copies/mL). A negative result must be combined with clinical observations, patient history, and epidemiological information. The expected result is Negative.  Fact Sheet for Patients:  EntrepreneurPulse.com.au  Fact Sheet for Healthcare Providers:  IncredibleEmployment.be  This test is no t yet approved or cleared by the [place] FDA and  has been authorized for detection and/or diagnosis of SARS-CoV-2 by FDA under an Emergency Use Authorization (EUA). This EUA will remain  in effect (meaning this test can be used) for the duration of the COVID-19 declaration under Section 564(b)(1) of the Act, 21 U.S.C.section 360bbb-3(b)(1), unless the authorization is terminated  or revoked sooner.       Influenza A by PCR NEGATIVE NEGATIVE Final   Influenza B by PCR NEGATIVE NEGATIVE Final    Comment: (NOTE) The Xpert Xpress SARS-CoV-2/FLU/RSV plus assay is intended as an aid in the diagnosis of influenza from Nasopharyngeal swab specimens and should not be used as a sole basis for treatment. Nasal washings and aspirates are unacceptable for Xpert Xpress SARS-CoV-2/FLU/RSV testing.  Fact Sheet for Patients: EntrepreneurPulse.com.au  Fact Sheet for Healthcare Providers: IncredibleEmployment.be  This test is not yet approved  or cleared by the [place] and has been authorized for detection and/or diagnosis of SARS-CoV-2 by FDA under  an Emergency Use Authorization (EUA). This EUA will remain in effect (meaning this test can be used) for the duration of the COVID-19 declaration under Section 564(b)(1) of the Act, 21 U.S.C. section 360bbb-3(b)(1), unless the authorization is terminated or revoked.     Resp Syncytial Virus by PCR NEGATIVE NEGATIVE Final    Comment: (NOTE) Fact Sheet for Patients: EntrepreneurPulse.com.au  Fact Sheet for Healthcare Providers: IncredibleEmployment.be  This test is not yet approved or cleared by the [place] FDA and has been authorized for detection and/or diagnosis of SARS-CoV-2 by FDA under an Emergency Use Authorization (EUA). This EUA will remain in effect (meaning this test can be used) for the duration of the COVID-19 declaration under Section 564(b)(1) of the Act, 21 U.S.C. section 360bbb-3(b)(1), unless the authorization is terminated or revoked.  Performed at [facility], [place] [address], [place], [place] [address]   Surgical PCR screen     Status: None   Collection Time: 09/03/22  6:03 AM   Specimen: Nasal Mucosa; Nasal Swab  Result Value Ref Range Status   MRSA, PCR NEGATIVE NEGATIVE Final   Staphylococcus aureus NEGATIVE NEGATIVE Final    Comment: (NOTE) The Xpert SA Assay (FDA approved for NASAL specimens in patients 53 years of age and older), is one component of a comprehensive surveillance program. It is not intended to diagnose infection nor to guide or monitor treatment. Performed at [facility], [place] [address] [place], [place] [address]   Aerobic/Anaerobic Culture w Gram Stain (surgical/deep wound)     Status: None (Preliminary result)   Collection Time: 09/04/22 10:44 AM   Specimen: Pleural Fluid  Result Value Ref Range Status   Specimen Description PLEURAL  Final   Special Requests RIGHT  Final   Gram Stain NO ORGANISMS SEEN NO WBC SEEN   Final   Culture   Final    NO GROWTH 4 DAYS  NO ANAEROBES ISOLATED; CULTURE IN PROGRESS FOR 5 DAYS Performed at [facility], [place] [address] [place], [place] [address]    Report Status PENDING  Incomplete    Radiology Studies: DG CHEST PORT 1 VIEW  Result Date: 09/08/2022 CLINICAL DATA:  Status post chest tube removal EXAM: PORTABLE CHEST 1 VIEW COMPARISON:  09/07/2022 FINDINGS: Pigtail catheter has been removed on the right. Residual effusion is again identified. Patchy airspace opacity in the right base is again seen. Left lung remains clear. Cardiac shadow is stable. IMPRESSION: No pneumothorax is noted following chest tube removal. Persistent small effusion and basilar opacity on the right are seen. Electronically Signed   By: Inez Catalina M.D.   On: 09/08/2022 19:39    Marzetta Board, MD, PhD [facility]  Between 7 am - 7 pm I am available, please contact me via Amion (for emergencies) or Securechat (non urgent messages)  Between 7 pm - 7 am I am not available, please contact night coverage MD/APP via Amion

## 2022-09-09 NOTE — Progress Notes (Addendum)
PT Cancellation Note  Patient Details Name: Gail Jennings MRN: [id number] DOB: March 30, 1933   Cancelled Treatment:    Reason Eval/Treat Not Completed: Patient declined, no reason specified. Pt with visitor present and declines participating with PT at this time despite visitor offering to step out to waiting area for therapy. Pt appears overwhelmed and emotional, reporting "this is all too much". Will check back as schedule allows to continue with PT POC.    Thelma Comp 09/09/2022, 10:44 AM  Rolinda Roan, PT, DPT Acute Rehabilitation Services Secure Chat Preferred Office: [phone]

## 2022-09-09 NOTE — Progress Notes (Signed)
**Note Gail-Identified via Obfuscation** Daily Progress Note   Patient Name: Gail Jennings       Date: 09/09/2022 DOB: [birth year]  Age: 89 y.o. MRN#: [id number] Attending Physician: Caren Griffins, MD Primary Care Physician: Burnard Bunting, MD Admit Date: 09/02/2022  Reason for Consultation/Follow-up: Establishing goals of care  Subjective: Feeling better today, continues with difficulty in decision making  Length of Stay: 7  Current Medications: Scheduled Meds:   amoxicillin-clavulanate  1 tablet Oral Q12H   apixaban  5 mg Oral BID   benzonatate  200 mg Oral TID   folic acid  1 mg Oral Daily   metoprolol tartrate  37.5 mg Oral BID   pneumococcal 20-valent conjugate vaccine  0.5 mL Intramuscular Tomorrow-1000   senna-docusate  2 tablet Oral BID   sodium chloride flush  10 mL Intrapleural Q8H    Continuous Infusions:   PRN Meds: hydrOXYzine, levalbuterol, melatonin, metoprolol tartrate, prochlorperazine  Physical Exam Constitutional:      General: She is not in acute distress.    Appearance: She is ill-appearing.  Pulmonary:     Effort: Pulmonary effort is normal.  Skin:    General: Skin is warm and dry.  Neurological:     Mental Status: She is alert.             Vital Signs: BP 107/79 (BP Location: Right Arm)   Pulse (!) 107   Temp 98.1 F (36.7 C) (Oral)   Resp 18   Ht 5\' 4"  (1.626 m)   Wt 48.7 kg   SpO2 100%   BMI 18.43 kg/m  SpO2: SpO2: 100 % O2 Device: O2 Device: Nasal Cannula O2 Flow Rate: O2 Flow Rate (L/min): 3 L/min  Intake/output summary:  Intake/Output Summary (Last 24 hours) at 09/09/2022 1245 Last data filed at 09/08/2022 2242 Gross per 24 hour  Intake 240 ml  Output --  Net 240 ml   LBM: Last BM Date : 09/08/22 Baseline Weight: Weight: 45.4 kg Most recent weight: Weight: 48.7 kg        Palliative Assessment/Data: PPS 30%      Patient Active Problem List   Diagnosis Date Noted   Malignant pleural effusion 09/08/2022   Empyema ([place]) 09/02/2022   Goals of care, counseling/discussion 08/10/2022   Primary lung adenocarcinoma, right ([place]) 07/17/2022   Atrial fibrillation with RVR ([place]) 07/09/2022   Respiratory distress 07/09/2022   Pleural effusion on right 07/09/2022   Essential hypertension 07/09/2022   Chronic hyponatremia 07/09/2022   Paroxysmal A-fib (HCC)    History of left indirect inguinal hernia and lap repair left femoral hernia 11/22/2012    Palliative Care Assessment & Plan   HPI: 89 y.o. female  with past medical history of HTN, PAF on Eliquis, and metastatic adenocarcinoma of the right lung with recurrent malignant effusion admitted on 09/02/2022 with shortness of breath, right middle lower lobe pneumonia as well as loculated pleural effusion on the right. IR placed chest tube 12/31. Receiving antibiotic therapy. Follows with Dr. Inda Merlin for metastatic lung cancer. PMT consulted to discuss [place].    Assessment: Still processing options of home locally vs to [place] with family. Still unclear. Lots of visitors today. Remains pain free, no shortness of breath.  PMT spoke with patient advocate Gail Jennings. All questions and concerns addressed. Will follow along.   Recommendations/Plan: Ongoing discussions re home with hospice locally vs transfer to [place] PMT will f/u tomorrow Niece Gail Jennings primary HCPOA, daughter Gail Jennings secondary  Code Status: DNR  Discharge Planning: To Be Determined  Care plan was discussed with patient, advocate, Dr Cruzita Lederer  Thank you for allowing the Palliative Medicine Team to assist in the care of this patient.  *Please note that this is a verbal dictation therefore any spelling or grammatical errors are due to the "[employer]" system interpretation.  Juel Burrow, DNP, [facility] Palliative  Medicine Team Team Phone # [phone]  Pager [phone]

## 2022-09-09 NOTE — Progress Notes (Signed)
Physical Therapy Treatment Patient Details Name: Gail Jennings MRN: [id number] DOB: 24-May-1933 Today's Date: 09/09/2022   History of Present Illness Patient is a 89 year old female with Loculated pleural effusion s/p chest tube placement on 12/31, pneumonia, acute respiratory failure with hypoxia, A-fib with RVR. History of metastatic adenocarcinoma of right lung with chemotherapy.    PT Comments    Pt was agitated throughout and difficult to focus on task.  Emphasis on transitions, sit to stand safety and progression of gait stability/stamina/quality.    Recommendations for follow up therapy are one component of a multi-disciplinary discharge planning process, led by the attending physician.  Recommendations may be updated based on patient status, additional functional criteria and insurance authorization.  Follow Up Recommendations  Skilled nursing-short term rehab (<3 hours/day) Can patient physically be transported by private vehicle: No   Assistance Recommended at Discharge Frequent or constant Supervision/Assistance  Patient can return home with the following A little help with walking and/or transfers;A little help with bathing/dressing/bathroom;Assistance with feeding;Assist for transportation;Help with stairs or ramp for entrance   Equipment Recommendations  Rolling walker (2 wheels)    Recommendations for Other Services       Precautions / Restrictions Precautions Precautions: Fall     Mobility  Bed Mobility Overal bed mobility: Needs Assistance Bed Mobility: Supine to Sit, Sit to Supine     Supine to sit: Min assist Sit to supine: Min assist   General bed mobility comments: cues for direction/hand placement, redirection due to agitated.  minimal assist up and to the EOB.  Minimal LE assist to supine.    Transfers Overall transfer level: Needs assistance Equipment used: Rolling walker (2 wheels) Transfers: Sit to/from Stand Sit to Stand: Min assist            General transfer comment: cues for hand placement, forward and stability assist.    Ambulation/Gait Ambulation/Gait assistance: Mod assist Gait Distance (Feet): 60 Feet Assistive device: Rolling walker (2 wheels) Gait Pattern/deviations: Step-through pattern   Gait velocity interpretation: <1.31 ft/sec, indicative of household ambulator   General Gait Details: cues for direction/focus on task. Stability assist and maneuvering the RW.  cues for proximity to the RW and posture.   Stairs             Wheelchair Mobility    Modified Rankin (Stroke Patients Only)       Balance Overall balance assessment: Needs assistance Sitting-balance support: Feet supported Sitting balance-Leahy Scale: Fair     Standing balance support: Bilateral upper extremity supported Standing balance-Leahy Scale: Poor Standing balance comment: reliant on external support orAD                            Cognition Arousal/Alertness: Awake/alert Behavior During Therapy: Anxious, Restless, Agitated Overall Cognitive Status: Impaired/Different from baseline (NT formally, pt agitated)                                          Exercises      General Comments General comments (skin integrity, edema, etc.): pt noticably fatiguing with gait.  VSS generally, SpO2 99% on 3L and HR 113 bpm      Pertinent Vitals/Pain Pain Assessment Pain Assessment: Faces Faces Pain Scale: No hurt Pain Intervention(s): Monitored during session    Home Living Family/patient expects to be discharged to:: Private residence  Prior Function            PT Goals (current goals can now be found in the care plan section) Acute Rehab PT Goals PT Goal Formulation: With patient Time For Goal Achievement: 09/21/22 Potential to Achieve Goals: Fair Progress towards PT goals: Progressing toward goals    Frequency    Min 3X/week      PT Plan  Current plan remains appropriate    Co-evaluation              AM-PAC PT "6 Clicks" Mobility   Outcome Measure  Help needed turning from your back to your side while in a flat bed without using bedrails?: A Little Help needed moving from lying on your back to sitting on the side of a flat bed without using bedrails?: A Little Help needed moving to and from a bed to a chair (including a wheelchair)?: A Lot Help needed standing up from a chair using your arms (e.g., wheelchair or bedside chair)?: A Lot Help needed to walk in hospital room?: A Lot Help needed climbing 3-5 steps with a railing? : A Lot 6 Click Score: 14    End of Session Equipment Utilized During Treatment: Oxygen Activity Tolerance: Patient limited by fatigue Patient left: in bed;with call bell/phone within reach Nurse Communication: Mobility status PT Visit Diagnosis: Other abnormalities of gait and mobility (R26.89);Difficulty in walking, not elsewhere classified (R26.2)     Time: 3903-0092 PT Time Calculation (min) (ACUTE ONLY): 20 min  Charges:  $Gait Training: 8-22 mins                     09/09/2022  Gail Carne., PT Acute Rehabilitation Services [phone]  (office)   Gail Jennings 09/09/2022, 5:04 PM

## 2022-09-09 NOTE — Progress Notes (Signed)
Pt refused vitals this am. Pt requested to be left alone and to let her sleep

## 2022-09-09 NOTE — Progress Notes (Signed)
Pt was offered bath multiple times. Pt stated she would take a Bath next week. RN notifed

## 2022-09-09 NOTE — Progress Notes (Addendum)
ANTICOAGULATION CONSULT NOTE  Pharmacy Consult for Heparin Indication: atrial fibrillation and aortic arch mural thrombus  Allergies  Allergen Reactions   Succinylcholine Chloride Other (See Comments)    Prolonged paralysis Brand name Anectine   Codeine Nausea And Vomiting   Hydrocodone Nausea And Vomiting    Patient Measurements: Height: 5\' 4"  (162.6 cm) Weight: 48.7 kg (107 lb 5.8 oz) IBW/kg (Calculated) : 54.7  Heparin Dosing Weight: 49 kg  Vital Signs: Temp: 98.2 F (36.8 C) (01/05 0621) Temp Source: Oral (01/05 0621) BP: 140/71 (01/05 0621) Pulse Rate: 108 (01/05 0621)  Labs: Recent Labs    09/07/22 0209 09/07/22 0725 09/08/22 0227 09/09/22 0412  HGB 10.7*  --  9.9* 10.2*  HCT 33.1*  --  31.0* 31.1*  PLT 882*  --  805* 726*  HEPARINUNFRC 0.19* 0.59 0.38 0.68  CREATININE  --   --  0.66 0.60     Estimated Creatinine Clearance: 38.1 mL/min (by C-G formula based on SCr of 0.6 mg/dL).   Assessment: 89 YOF with medical history significant for paroxysmal Afib (no longer on Eliquis) and recently diagnosed stage IV adenocarcinoma of right lung who was found to have a mural thrombus at the aortic arch. Pharmacy consulted to manage heparin.  Heparin level 0.68 which is therapeutic while on heparin 1100 units/hr. Has been stable on this rate for multiple days. CBC Stable with no signs of bleeding.  Goal of Therapy:  Heparin level 0.3-0.7 units/ml Monitor platelets by anticoagulation protocol: Yes   Plan:  Transition to Eliquis 5mg  PO BID given treatment for mural thrombus. Patient has been therapeutic on IV heparin for 7 days and does not require the high dose load.   Thank you for allowing pharmacy to be a part of this patient's care.  Erskine Speed, PharmD Clinical Pharmacist

## 2022-09-10 DIAGNOSIS — J869 Pyothorax without fistula: Secondary | ICD-10-CM | POA: Diagnosis not present

## 2022-09-10 DIAGNOSIS — Z7189 Other specified counseling: Secondary | ICD-10-CM | POA: Diagnosis not present

## 2022-09-10 DIAGNOSIS — Z515 Encounter for palliative care: Secondary | ICD-10-CM | POA: Diagnosis not present

## 2022-09-10 DIAGNOSIS — C3491 Malignant neoplasm of unspecified part of right bronchus or lung: Secondary | ICD-10-CM | POA: Diagnosis not present

## 2022-09-10 LAB — BASIC METABOLIC PANEL
Anion gap: 8 (ref 5–15)
BUN: 17 mg/dL (ref 8–23)
CO2: 21 mmol/L — ABNORMAL LOW (ref 22–32)
Calcium: 8.1 mg/dL — ABNORMAL LOW (ref 8.9–10.3)
Chloride: 105 mmol/L (ref 98–111)
Creatinine, Ser: 0.53 mg/dL (ref 0.44–1.00)
GFR, Estimated: >60 mL/min (ref >60)
Glucose, Bld: 104 mg/dL — ABNORMAL HIGH (ref 70–99)
Potassium: 3.9 mmol/L (ref 3.5–5.1)
Sodium: 134 mmol/L — ABNORMAL LOW (ref 135–145)

## 2022-09-10 LAB — CBC
HCT: 30.5 % — ABNORMAL LOW (ref 36.0–46.0)
Hemoglobin: 10 g/dL — ABNORMAL LOW (ref 12.0–15.0)
MCH: 30.8 pg (ref 26.0–34.0)
MCHC: 32.8 g/dL (ref 30.0–36.0)
MCV: 93.8 fL (ref 80.0–100.0)
Platelets: 658 10*3/uL — ABNORMAL HIGH (ref 150–400)
RBC: 3.25 MIL/uL — ABNORMAL LOW (ref 3.87–5.11)
RDW: 14.6 % (ref 11.5–15.5)
WBC: 13.6 10*3/uL — ABNORMAL HIGH (ref 4.0–10.5)
nRBC: 0 % (ref 0.0–0.2)

## 2022-09-10 LAB — MAGNESIUM: Magnesium: 1.6 mg/dL — ABNORMAL LOW (ref 1.7–2.4)

## 2022-09-10 NOTE — Progress Notes (Signed)
Daily Progress Note   Patient Name: Gail Jennings       Date: 09/10/2022 DOB: 06-Jun-1933  Age: 89 y.o. MRN#: [id number] Attending Physician: Caren Griffins, MD Primary Care Physician: Burnard Bunting, MD Admit Date: 09/02/2022  Reason for Consultation/Follow-up: Establishing goals of care  Subjective: Continues to feel frustration with decision making about discharge  Length of Stay: 8  Current Medications: Scheduled Meds:   amoxicillin-clavulanate  1 tablet Oral Q12H   apixaban  5 mg Oral BID   benzonatate  200 mg Oral TID   folic acid  1 mg Oral Daily   metoprolol tartrate  37.5 mg Oral BID   pneumococcal 20-valent conjugate vaccine  0.5 mL Intramuscular Tomorrow-1000   senna-docusate  2 tablet Oral BID   sodium chloride flush  10 mL Intrapleural Q8H    Continuous Infusions:   PRN Meds: hydrOXYzine, levalbuterol, melatonin, metoprolol tartrate, prochlorperazine  Physical Exam Constitutional:      General: She is not in acute distress.    Appearance: She is ill-appearing.  Pulmonary:     Effort: Pulmonary effort is normal.  Skin:    General: Skin is warm and dry.  Neurological:     Mental Status: She is alert.             Vital Signs: BP 107/78 (BP Location: Right Arm)   Pulse (!) 106   Temp 98.6 F (37 C) (Oral)   Resp 17   Ht 5\' 4"  (1.626 m)   Wt 48.7 kg   SpO2 98%   BMI 18.43 kg/m  SpO2: SpO2: 98 % O2 Device: O2 Device: Nasal Cannula O2 Flow Rate: O2 Flow Rate (L/min): 2 L/min  Intake/output summary:  Intake/Output Summary (Last 24 hours) at 09/10/2022 1407 Last data filed at 09/10/2022 0900 Gross per 24 hour  Intake 220 ml  Output --  Net 220 ml    LBM: Last BM Date : 09/08/22 Baseline Weight: Weight: 45.4 kg Most recent weight: Weight: 48.7 kg        Palliative Assessment/Data: PPS 30%      Patient Active Problem List   Diagnosis Date Noted   Malignant pleural effusion 09/08/2022   Empyema ([place]) 09/02/2022   Goals of care, counseling/discussion 08/10/2022   Primary lung adenocarcinoma, right ([place]) 07/17/2022   Atrial fibrillation with RVR ([place]) 07/09/2022   Respiratory distress 07/09/2022   Pleural effusion on right 07/09/2022   Essential hypertension 07/09/2022   Chronic hyponatremia 07/09/2022   Paroxysmal A-fib (HCC)    History of left indirect inguinal hernia and lap repair left femoral hernia 11/22/2012    Palliative Care Assessment & Plan   HPI: 89 y.o. female  with past medical history of HTN, PAF on Eliquis, and metastatic adenocarcinoma of the right lung with recurrent malignant effusion admitted on 09/02/2022 with shortness of breath, right middle lower lobe pneumonia as well as loculated pleural effusion on the right. IR placed chest tube 12/31. Receiving antibiotic therapy. Follows with Dr. Inda Merlin for metastatic lung cancer. PMT consulted to discuss [place].    Assessment: Still processing options of home locally vs to [place] with family. Still unclear. She is frustrated and feels uncertain. We discuss the type  of care she can receive at home and how her needs will be met.  She asks for more time. Remains pain free, no shortness of breath. All questions and concerns addressed. She tells me her niece will be coming to town soon - she will try to make decision with her help. Will ask PMT to follow up when niece is present  Recommendations/Plan: Ongoing discussions re home with hospice locally vs transfer to [place] PMT will f/u tomorrow Niece Nadine primary HCPOA, daughter Merrimon secondary  Code Status: DNR  Discharge Planning: To Be Determined  Care plan was discussed with patient, Dr Cruzita Lederer  Thank you for allowing the Palliative Medicine Team to assist in the care of this patient.  *Please  note that this is a verbal dictation therefore any spelling or grammatical errors are due to the "[employer]" system interpretation.  Juel Burrow, DNP, [facility] Palliative Medicine Team Team Phone # [phone]  Pager [phone]

## 2022-09-10 NOTE — Progress Notes (Signed)
Pt one person assist to the bedside commode. Place pt in chair with call button, personal phone, and table in reach.

## 2022-09-10 NOTE — Progress Notes (Signed)
PROGRESS NOTE  Gail Jennings HWE:[id number] DOB: 14-Feb-1933 DOA: 09/02/2022 PCP: Burnard Bunting, MD   LOS: 8 days   Brief Narrative / Interim history: 89 year old female with history of HTN, PAF on Eliquis, metastatic adenocarcinoma of the right lung with recurrent malignant effusion comes into the hospital with shortness of breath, right middle lower lobe pneumonia as well as loculated pleural effusion on the right.  She presented to [facility] and transferred to [facility] for cardiothoracic surgery evaluation, but upon arrival to [facility] this was deferred to IR who placed the chest tube on 12/31.  Unfortunately cell count was not sent and cultures are pending  Subjective / 24h Interval events: She feels sleepy this morning, does not want to interact much  Assesement and Plan: Principal Problem:   Empyema ([place]) Active Problems:   Essential hypertension   Primary lung adenocarcinoma, right (HCC)   Goals of care, counseling/discussion   Malignant pleural effusion  Principal problem Loculated pleural effusion, status post chest tube - this was placed by IR on 12/31, PCCM consulted and following, status post lytics 09/07/2022.  Chest tube discontinued 1/4, she has been placed on prolonged antibiotics with Augmentin  Active problems Right middle and lower lobe pneumonia-noted on imaging, antibiotics per ID, currently on Augmentin  Metastatic adenocarcinoma of the right lung, malignant pleural effusion-follows with Dr. Julien Nordmann as an outpatient.  Treatment is on hold due to acute infection.  I have consulted palliative care as overall she has a very poor prognosis with stage IV cancer and active infection.  Meeting again today with palliative, family coming down from the [place]  Aortic arch moral thrombus-she is already on anticoagulation for PAF, chest tube discontinued, now on Eliquis  Acute hypoxic respiratory failure-due to pneumonia, recurrent pleural effusion.   Likely needs oxygen on discharge  PAF with RVR-anticoagulated with heparin, continue metoprolol  Thrombocytosis-likely reactive  Severe protein malnutrition-BMI 18, due to malignancy  Essential hypertension-continue metoprolol, blood pressure is stable  Scheduled Meds:  amoxicillin-clavulanate  1 tablet Oral Q12H   apixaban  5 mg Oral BID   benzonatate  200 mg Oral TID   folic acid  1 mg Oral Daily   metoprolol tartrate  37.5 mg Oral BID   pneumococcal 20-valent conjugate vaccine  0.5 mL Intramuscular Tomorrow-1000   senna-docusate  2 tablet Oral BID   sodium chloride flush  10 mL Intrapleural Q8H   Continuous Infusions:   PRN Meds:.hydrOXYzine, levalbuterol, melatonin, metoprolol tartrate, prochlorperazine  Current Outpatient Medications  Medication Instructions   apixaban (ELIQUIS) 2.5 mg, Oral, 2 times daily   folic acid (FOLVITE) 1 mg, Oral, Daily   metoprolol tartrate (LOPRESSOR) 12.5 mg, Oral, 2 times daily   ondansetron (ZOFRAN) 8 mg, Oral, Every 8 hours PRN, Start on day 3 after carboplatin.   prochlorperazine (COMPAZINE) 10 mg, Oral, Every 6 hours PRN    Diet Orders (From admission, onward)     Start     Ordered   09/04/22 1137  Diet regular Room service appropriate? Yes; Fluid consistency: Thin  Diet effective now       Question Answer Comment  Room service appropriate? Yes   Fluid consistency: Thin      09/04/22 1136            DVT prophylaxis: SCDs Start: 09/02/22 2016 apixaban (ELIQUIS) tablet 5 mg   Lab Results  Component Value Date   PLT 658 (H) 09/10/2022      Code Status: DNR  Family Communication:  No family at bedside this morning  Status is: Inpatient  Remains inpatient appropriate because: Chest tube in place  Level of care: Telemetry Medical  Consultants:  TCS IR ID  Objective: Vitals:   09/09/22 1631 09/09/22 1944 09/10/22 0306 09/10/22 0754  BP: (!) 155/97 116/85 138/78 107/78  Pulse: 72 (!) 105 (!) 102 (!) 106   Resp: 18 18 16 17   Temp: 98.1 F (36.7 C) 97.6 F (36.4 C) 98 F (36.7 C) 98.6 F (37 C)  TempSrc:  Oral Oral Oral  SpO2: 98% 99% 99% 98%  Weight:      Height:       No intake or output data in the 24 hours ending 09/10/22 0857  Wt Readings from Last 3 Encounters:  09/02/22 48.7 kg  08/23/22 47.8 kg  08/17/22 46 kg    Examination: Constitutional: NAD Eyes: lids and conjunctivae normal, no scleral icterus ENMT: mmm Neck: normal, supple Respiratory: clear to auscultation bilaterally, no wheezing, no crackles. Cardiovascular: Regular rate and rhythm, no murmurs / rubs / gallops.  Abdomen: soft, no distention, no tenderness. Bowel sounds positive.  Skin: no rashes  Data Reviewed: I have independently reviewed following labs and imaging studies   CBC Recent Labs  Lab 09/06/22 0313 09/07/22 0209 09/08/22 0227 09/09/22 0412 09/10/22 0256  WBC 13.3* 15.6* 13.9* 14.5* 13.6*  HGB 10.7* 10.7* 9.9* 10.2* 10.0*  HCT 32.0* 33.1* 31.0* 31.1* 30.5*  PLT 823* 882* 805* 726* 658*  MCV 92.0 95.1 95.4 93.4 93.8  MCH 30.7 30.7 30.5 30.6 30.8  MCHC 33.4 32.3 31.9 32.8 32.8  RDW 13.4 13.8 13.9 14.2 14.6     Recent Labs  Lab 09/04/22 1600 09/05/22 0212 09/08/22 0227 09/09/22 0412 09/10/22 0256  NA 131* 131* 132* 134* 134*  K 3.9 3.8 3.3* 3.6 3.9  CL 100 101 101 103 105  CO2 18* 19* 20* 22 21*  GLUCOSE 109* 100* 112* 109* 104*  BUN 15 15 26* 22 17  CREATININE 0.64 0.60 0.66 0.60 0.53  CALCIUM 8.1* 8.0* 8.2* 8.1* 8.1*  AST  --   --  19  --   --   ALT  --   --  14  --   --   ALKPHOS  --   --  63  --   --   BILITOT  --   --  0.6  --   --   ALBUMIN  --   --  1.6*  --   --   MG 1.6* 1.6* 1.7 1.7 1.6*     ------------------------------------------------------------------------------------------------------------------ No results for input(s): "CHOL", "HDL", "LDLCALC", "TRIG", "CHOLHDL", "LDLDIRECT" in the last 72 hours.  No results found for:  "HGBA1C" ------------------------------------------------------------------------------------------------------------------ No results for input(s): "TSH", "T4TOTAL", "T3FREE", "THYROIDAB" in the last 72 hours.  Invalid input(s): "FREET3"  Cardiac Enzymes No results for input(s): "CKMB", "TROPONINI", "MYOGLOBIN" in the last 168 hours.  Invalid input(s): "CK" ------------------------------------------------------------------------------------------------------------------    Component Value Date/Time   BNP 120.5 (H) 09/02/2022 1400    CBG: Recent Labs  Lab 09/09/22 0829 09/09/22 1143 09/09/22 1635  GLUCAP 110* 89 104*     Recent Results (from the past 240 hour(s))  Resp panel by RT-PCR (RSV, Flu A&B, Covid) Anterior Nasal Swab     Status: None   Collection Time: 09/02/22  1:22 PM   Specimen: Anterior Nasal Swab  Result Value Ref Range Status   SARS Coronavirus 2 by RT PCR NEGATIVE NEGATIVE Final    Comment: (NOTE) SARS-CoV-2 target  nucleic acids are NOT DETECTED.  The SARS-CoV-2 RNA is generally detectable in upper respiratory specimens during the acute phase of infection. The lowest concentration of SARS-CoV-2 viral copies this assay can detect is 138 copies/mL. A negative result does not preclude SARS-Cov-2 infection and should not be used as the sole basis for treatment or other patient management decisions. A negative result may occur with  improper specimen collection/handling, submission of specimen other than nasopharyngeal swab, presence of viral mutation(s) within the areas targeted by this assay, and inadequate number of viral copies(<138 copies/mL). A negative result must be combined with clinical observations, patient history, and epidemiological information. The expected result is Negative.  Fact Sheet for Patients:  EntrepreneurPulse.com.au  Fact Sheet for Healthcare Providers:  IncredibleEmployment.be  This test is  no t yet approved or cleared by the [place] FDA and  has been authorized for detection and/or diagnosis of SARS-CoV-2 by FDA under an Emergency Use Authorization (EUA). This EUA will remain  in effect (meaning this test can be used) for the duration of the COVID-19 declaration under Section 564(b)(1) of the Act, 21 U.S.C.section 360bbb-3(b)(1), unless the authorization is terminated  or revoked sooner.       Influenza A by PCR NEGATIVE NEGATIVE Final   Influenza B by PCR NEGATIVE NEGATIVE Final    Comment: (NOTE) The Xpert Xpress SARS-CoV-2/FLU/RSV plus assay is intended as an aid in the diagnosis of influenza from Nasopharyngeal swab specimens and should not be used as a sole basis for treatment. Nasal washings and aspirates are unacceptable for Xpert Xpress SARS-CoV-2/FLU/RSV testing.  Fact Sheet for Patients: EntrepreneurPulse.com.au  Fact Sheet for Healthcare Providers: IncredibleEmployment.be  This test is not yet approved or cleared by the [place] FDA and has been authorized for detection and/or diagnosis of SARS-CoV-2 by FDA under an Emergency Use Authorization (EUA). This EUA will remain in effect (meaning this test can be used) for the duration of the COVID-19 declaration under Section 564(b)(1) of the Act, 21 U.S.C. section 360bbb-3(b)(1), unless the authorization is terminated or revoked.     Resp Syncytial Virus by PCR NEGATIVE NEGATIVE Final    Comment: (NOTE) Fact Sheet for Patients: EntrepreneurPulse.com.au  Fact Sheet for Healthcare Providers: IncredibleEmployment.be  This test is not yet approved or cleared by the [place] FDA and has been authorized for detection and/or diagnosis of SARS-CoV-2 by FDA under an Emergency Use Authorization (EUA). This EUA will remain in effect (meaning this test can be used) for the duration of the COVID-19 declaration under Section  564(b)(1) of the Act, 21 U.S.C. section 360bbb-3(b)(1), unless the authorization is terminated or revoked.  Performed at [facility], [place] [address] [place], [place] [address]   Surgical PCR screen     Status: None   Collection Time: 09/03/22  6:03 AM   Specimen: Nasal Mucosa; Nasal Swab  Result Value Ref Range Status   MRSA, PCR NEGATIVE NEGATIVE Final   Staphylococcus aureus NEGATIVE NEGATIVE Final    Comment: (NOTE) The Xpert SA Assay (FDA approved for NASAL specimens in patients 71 years of age and older), is one component of a comprehensive surveillance program. It is not intended to diagnose infection nor to guide or monitor treatment. Performed at [facility], [place] [address] [place], [place] [address]   Aerobic/Anaerobic Culture w Gram Stain (surgical/deep wound)     Status: None   Collection Time: 09/04/22 10:44 AM   Specimen: Pleural Fluid  Result Value Ref Range Status  Specimen Description PLEURAL  Final   Special Requests RIGHT  Final   Gram Stain NO ORGANISMS SEEN NO WBC SEEN   Final   Culture   Final    No growth aerobically or anaerobically. Performed at [facility], [place] [address] [place], [place] [address]    Report Status 09/09/2022 FINAL  Final    Radiology Studies: No results found.  Marzetta Board, MD, PhD [facility]  Between 7 am - 7 pm I am available, please contact me via Amion (for emergencies) or Securechat (non urgent messages)  Between 7 pm - 7 am I am not available, please contact night coverage MD/APP via Amion

## 2022-09-11 ENCOUNTER — Inpatient Hospital Stay (HOSPITAL_COMMUNITY): Payer: Medicare Other

## 2022-09-11 DIAGNOSIS — Z7189 Other specified counseling: Secondary | ICD-10-CM | POA: Diagnosis not present

## 2022-09-11 DIAGNOSIS — Z515 Encounter for palliative care: Secondary | ICD-10-CM | POA: Diagnosis not present

## 2022-09-11 DIAGNOSIS — J869 Pyothorax without fistula: Secondary | ICD-10-CM | POA: Diagnosis not present

## 2022-09-11 DIAGNOSIS — J910 Malignant pleural effusion: Secondary | ICD-10-CM | POA: Diagnosis not present

## 2022-09-11 DIAGNOSIS — C3491 Malignant neoplasm of unspecified part of right bronchus or lung: Secondary | ICD-10-CM | POA: Diagnosis not present

## 2022-09-11 LAB — CBC
HCT: 31 % — ABNORMAL LOW (ref 36.0–46.0)
Hemoglobin: 10.2 g/dL — ABNORMAL LOW (ref 12.0–15.0)
MCH: 30.6 pg (ref 26.0–34.0)
MCHC: 32.9 g/dL (ref 30.0–36.0)
MCV: 93.1 fL (ref 80.0–100.0)
Platelets: 564 10*3/uL — ABNORMAL HIGH (ref 150–400)
RBC: 3.33 MIL/uL — ABNORMAL LOW (ref 3.87–5.11)
RDW: 14.9 % (ref 11.5–15.5)
WBC: 14.3 10*3/uL — ABNORMAL HIGH (ref 4.0–10.5)
nRBC: 0 % (ref 0.0–0.2)

## 2022-09-11 MED ORDER — MORPHINE SULFATE (PF) 2 MG/ML IV SOLN
INTRAVENOUS | Status: AC
  Filled 2022-09-11: qty 1

## 2022-09-11 MED ORDER — MORPHINE SULFATE (PF) 2 MG/ML IV SOLN
1.0000 mg | INTRAVENOUS | Status: DC | PRN
  Administered 2022-09-13 – 2022-09-16 (×9): 1 mg via INTRAVENOUS
  Filled 2022-09-11 (×10): qty 1

## 2022-09-11 MED ORDER — MORPHINE SULFATE (PF) 2 MG/ML IV SOLN
1.0000 mg | Freq: Once | INTRAVENOUS | Status: AC
  Administered 2022-09-11: 1 mg via INTRAVENOUS

## 2022-09-11 MED ORDER — FUROSEMIDE 10 MG/ML IJ SOLN
40.0000 mg | Freq: Once | INTRAMUSCULAR | Status: AC
  Administered 2022-09-11: 40 mg via INTRAVENOUS
  Filled 2022-09-11: qty 4

## 2022-09-11 NOTE — Progress Notes (Signed)
**Note Gail-Identified via Obfuscation** Pt complain she can't breath, pt is working harder to breath. Notified RN on the floor and charge to assess pt with this nurse.   Obtain vitals, gave PRN breathing treatment and call rapid response, and page MD via secure chat. Pt in [place], charge and rapid RN aware. MD place new orders.  Pt at this time is relax, blood pressure and heart rate monitor every 15 minutes. Gail Jennings, family advocate at bedside and will be with her through the night. Pt receive IV lasix as well.

## 2022-09-11 NOTE — Significant Event (Signed)
Rapid Response Event Note   Reason for Call :  Increased work of breathing  Initial Focused Assessment:  Pt lying in bed, AO. Tachypneic with accessory muscle use. Lung sounds are clear following PRN Xopenex Neb. Pt endorses feeling like she can't breath. Skin is warm, moist, pink.   VS: BP 156/111, HR 162, RR 30, SpO2 98% on 2LNC  Interventions:  -CXR -1mg  IV Morphine x1 dose -PRN Metoprolol given, per Dr. Cruzita Lederer -Foley catheter inserted  Plan of Care:  -Diurese with Lasix  Call rapid response with additional needs  Event Summary:  MD Notified: Dr. Cruzita Lederer Call Time: 7225 Arrival Time: 7505 End Time: [place], RN

## 2022-09-11 NOTE — Progress Notes (Signed)
   09/11/22 1716  Assess: MEWS Score  BP (!) 156/111  MAP (mmHg) 121  Pulse Rate (!) 162  Resp (!) 30  Level of Consciousness Alert  SpO2 98 %  O2 Device Nasal Cannula  O2 Flow Rate (L/min) 2 L/min  Assess: MEWS Score  MEWS Temp 0  MEWS Systolic 0  MEWS Pulse 3  MEWS RR 2  MEWS LOC 0  MEWS Score 5  MEWS Score Color Red  Assess: if the MEWS score is Yellow or Red  Were vital signs taken at a resting state? Yes  Focused Assessment Change from prior assessment (see assessment flowsheet)  Does the patient meet 2 or more of the SIRS criteria? Yes  Does the patient have a confirmed or suspected source of infection? Yes  Provider and Rapid Response Notified? Yes  MEWS guidelines implemented *See Row Information* Yes  Treat  MEWS Interventions Escalated (See documentation below);Administered prn meds/treatments  Pain Scale 0-10  Pain Score 4  Pain Type Acute pain  Pain Location Flank  Pain Orientation Right  Take Vital Signs  Increase Vital Sign Frequency  Red: Q 1hr X 4 then Q 4hr X 4, if remains red, continue Q 4hrs  Escalate  MEWS: Escalate Red: discuss with charge nurse/RN and provider, consider discussing with RRT  Notify: Charge Nurse/RN  Name of Charge Nurse/RN Notified Milta Deiters RN  Date Charge Nurse/RN Notified 09/11/22  Time Charge Nurse/RN Notified 1732  Provider Notification  Provider Name/Title Dr. Marzetta Board  Date Provider Notified 09/11/22  Time Provider Notified 1715  Method of Notification Page  Notification Reason Change in status  Provider response See new orders  Date of Provider Response 09/11/22  Time of Provider Response 1725  Notify: Rapid Response  Name of Rapid Response RN Notified [facility] RN  Date Rapid Response Notified 09/11/22  Time Rapid Response Notified 1733  Assess: SIRS CRITERIA  SIRS Temperature  0  SIRS Pulse 1  SIRS Respirations  1  SIRS WBC 0  SIRS Score Sum  2

## 2022-09-11 NOTE — Progress Notes (Signed)
PROGRESS NOTE  Gail Jennings [name]:[id number] DOB: [birth year] DOA: 09/02/2022 PCP: Burnard Bunting, MD   LOS: 9 days   Brief Narrative / Interim history: 89 year old female with history of HTN, PAF on Eliquis, metastatic adenocarcinoma of the right lung with recurrent malignant effusion comes into the hospital with shortness of breath, right middle lower lobe pneumonia as well as loculated pleural effusion on the right.  She presented to [facility] and transferred to [facility] for cardiothoracic surgery evaluation, but upon arrival to [facility] this was deferred to IR who placed the chest tube on 12/31.  Unfortunately cell count was not sent and cultures are pending  Subjective / 24h Interval events: Complaints.  Family could not make it from [place] due to the weather, arriving tomorrow  Assesement and Plan: Principal Problem:   Empyema ([place]) Active Problems:   Essential hypertension   Primary lung adenocarcinoma, right (Gail Jennings)   Goals of care, counseling/discussion   Malignant pleural effusion  Principal problem Loculated pleural effusion, status post chest tube - this was placed by IR on 12/31, PCCM consulted and following, status post lytics 09/07/2022.  Chest tube discontinued 1/4, she has been placed on prolonged antibiotics with Augmentin  Active problems Right middle and lower lobe pneumonia-noted on imaging, antibiotics per ID, currently on Augmentin  Metastatic adenocarcinoma of the right lung, malignant pleural effusion-follows with Dr. Julien Nordmann as an outpatient.  Treatment is on hold due to acute infection.  I have consulted palliative care as overall she has a very poor prognosis with stage IV cancer and active infection.  Meeting again today with palliative, family coming down from the [place]  Aortic arch moral thrombus-she is already on anticoagulation for PAF, chest tube discontinued, now on Eliquis  Acute hypoxic respiratory failure-due to pneumonia,  recurrent pleural effusion.  Likely needs oxygen on discharge  PAF with RVR-anticoagulated with heparin, continue metoprolol  Thrombocytosis-likely reactive  Severe protein malnutrition-BMI 18, due to malignancy  Essential hypertension-continue metoprolol, blood pressure is stable  Goals of care/disposition-awaiting family discussion once they arrive in [place], likely tomorrow  Scheduled Meds:  amoxicillin-clavulanate  1 tablet Oral Q12H   apixaban  5 mg Oral BID   benzonatate  200 mg Oral TID   folic acid  1 mg Oral Daily   metoprolol tartrate  37.5 mg Oral BID   pneumococcal 20-valent conjugate vaccine  0.5 mL Intramuscular Tomorrow-1000   senna-docusate  2 tablet Oral BID   Continuous Infusions:   PRN Meds:.hydrOXYzine, levalbuterol, melatonin, metoprolol tartrate, prochlorperazine  Current Outpatient Medications  Medication Instructions   apixaban (ELIQUIS) 2.5 mg, Oral, 2 times daily   folic acid (FOLVITE) 1 mg, Oral, Daily   metoprolol tartrate (LOPRESSOR) 12.5 mg, Oral, 2 times daily   ondansetron (ZOFRAN) 8 mg, Oral, Every 8 hours PRN, Start on day 3 after carboplatin.   prochlorperazine (COMPAZINE) 10 mg, Oral, Every 6 hours PRN    Diet Orders (From admission, onward)     Start     Ordered   09/04/22 1137  Diet regular Room service appropriate? Yes; Fluid consistency: Thin  Diet effective now       Question Answer Comment  Room service appropriate? Yes   Fluid consistency: Thin      09/04/22 1136            DVT prophylaxis: SCDs Start: 09/02/22 2016 apixaban (ELIQUIS) tablet 5 mg   Lab Results  Component Value Date   PLT 564 (H) 09/11/2022  Code Status: DNR  Family Communication: No family at bedside this morning  Status is: Inpatient  Remains inpatient appropriate because: Ongoing goals of care/disposition discussions  Level of care: Telemetry Medical  Consultants:  TCS IR ID  Objective: Vitals:   09/10/22 1518 09/10/22  2018 09/11/22 0349 09/11/22 0742  BP: 124/83 135/87 (!) 135/94 127/69  Pulse: 98 (!) 106 (!) 110 (!) 110  Resp: 17 17 18 18   Temp: 98.1 F (36.7 C) 97.8 F (36.6 C) 98 F (36.7 C) 97.8 F (36.6 C)  TempSrc: Oral Oral Oral Oral  SpO2: 95% 100% 98% 100%  Weight:      Height:        Intake/Output Summary (Last 24 hours) at 09/11/2022 1519 Last data filed at 09/11/2022 1402 Gross per 24 hour  Intake 100 ml  Output 1200 ml  Net -1100 ml    Wt Readings from Last 3 Encounters:  09/02/22 48.7 kg  08/23/22 47.8 kg  08/17/22 46 kg    Examination: Constitutional: NAD Eyes: lids and conjunctivae normal, no scleral icterus ENMT: mmm Neck: normal, supple Respiratory: clear to auscultation bilaterally, no wheezing, no crackles. Normal respiratory effort.  Cardiovascular: Regular rate and rhythm, no murmurs / rubs / gallops. No LE edema. Abdomen: soft, no distention, no tenderness. Bowel sounds positive.  Skin: no rashes Neurologic: no focal deficits, equal strength  Data Reviewed: I have independently reviewed following labs and imaging studies   CBC Recent Labs  Lab 09/07/22 0209 09/08/22 0227 09/09/22 0412 09/10/22 0256 09/11/22 0211  WBC 15.6* 13.9* 14.5* 13.6* 14.3*  HGB 10.7* 9.9* 10.2* 10.0* 10.2*  HCT 33.1* 31.0* 31.1* 30.5* 31.0*  PLT 882* 805* 726* 658* 564*  MCV 95.1 95.4 93.4 93.8 93.1  MCH 30.7 30.5 30.6 30.8 30.6  MCHC 32.3 31.9 32.8 32.8 32.9  RDW 13.8 13.9 14.2 14.6 14.9     Recent Labs  Lab 09/04/22 1600 09/05/22 0212 09/08/22 0227 09/09/22 0412 09/10/22 0256  NA 131* 131* 132* 134* 134*  K 3.9 3.8 3.3* 3.6 3.9  CL 100 101 101 103 105  CO2 18* 19* 20* 22 21*  GLUCOSE 109* 100* 112* 109* 104*  BUN 15 15 26* 22 17  CREATININE 0.64 0.60 0.66 0.60 0.53  CALCIUM 8.1* 8.0* 8.2* 8.1* 8.1*  AST  --   --  19  --   --   ALT  --   --  14  --   --   ALKPHOS  --   --  63  --   --   BILITOT  --   --  0.6  --   --   ALBUMIN  --   --  1.6*  --   --   MG  1.6* 1.6* 1.7 1.7 1.6*     ------------------------------------------------------------------------------------------------------------------ No results for input(s): "CHOL", "HDL", "LDLCALC", "TRIG", "CHOLHDL", "LDLDIRECT" in the last 72 hours.  No results found for: "HGBA1C" ------------------------------------------------------------------------------------------------------------------ No results for input(s): "TSH", "T4TOTAL", "T3FREE", "THYROIDAB" in the last 72 hours.  Invalid input(s): "FREET3"  Cardiac Enzymes No results for input(s): "CKMB", "TROPONINI", "MYOGLOBIN" in the last 168 hours.  Invalid input(s): "CK" ------------------------------------------------------------------------------------------------------------------    Component Value Date/Time   BNP 120.5 (H) 09/02/2022 1400    CBG: Recent Labs  Lab 09/09/22 0829 09/09/22 1143 09/09/22 1635  GLUCAP 110* 89 104*     Recent Results (from the past 240 hour(s))  Resp panel by RT-PCR (RSV, Flu A&B, Covid) Anterior Nasal Swab  Status: None   Collection Time: 09/02/22  1:22 PM   Specimen: Anterior Nasal Swab  Result Value Ref Range Status   SARS Coronavirus 2 by RT PCR NEGATIVE NEGATIVE Final    Comment: (NOTE) SARS-CoV-2 target nucleic acids are NOT DETECTED.  The SARS-CoV-2 RNA is generally detectable in upper respiratory specimens during the acute phase of infection. The lowest concentration of SARS-CoV-2 viral copies this assay can detect is 138 copies/mL. A negative result does not preclude SARS-Cov-2 infection and should not be used as the sole basis for treatment or other patient management decisions. A negative result may occur with  improper specimen collection/handling, submission of specimen other than nasopharyngeal swab, presence of viral mutation(s) within the areas targeted by this assay, and inadequate number of viral copies(<138 copies/mL). A negative result must be combined  with clinical observations, patient history, and epidemiological information. The expected result is Negative.  Fact Sheet for Patients:  EntrepreneurPulse.com.au  Fact Sheet for Healthcare Providers:  IncredibleEmployment.be  This test is no t yet approved or cleared by the [place] FDA and  has been authorized for detection and/or diagnosis of SARS-CoV-2 by FDA under an Emergency Use Authorization (EUA). This EUA will remain  in effect (meaning this test can be used) for the duration of the COVID-19 declaration under Section 564(b)(1) of the Act, 21 U.S.C.section 360bbb-3(b)(1), unless the authorization is terminated  or revoked sooner.       Influenza A by PCR NEGATIVE NEGATIVE Final   Influenza B by PCR NEGATIVE NEGATIVE Final    Comment: (NOTE) The Xpert Xpress SARS-CoV-2/FLU/RSV plus assay is intended as an aid in the diagnosis of influenza from Nasopharyngeal swab specimens and should not be used as a sole basis for treatment. Nasal washings and aspirates are unacceptable for Xpert Xpress SARS-CoV-2/FLU/RSV testing.  Fact Sheet for Patients: EntrepreneurPulse.com.au  Fact Sheet for Healthcare Providers: IncredibleEmployment.be  This test is not yet approved or cleared by the [place] FDA and has been authorized for detection and/or diagnosis of SARS-CoV-2 by FDA under an Emergency Use Authorization (EUA). This EUA will remain in effect (meaning this test can be used) for the duration of the COVID-19 declaration under Section 564(b)(1) of the Act, 21 U.S.C. section 360bbb-3(b)(1), unless the authorization is terminated or revoked.     Resp Syncytial Virus by PCR NEGATIVE NEGATIVE Final    Comment: (NOTE) Fact Sheet for Patients: EntrepreneurPulse.com.au  Fact Sheet for Healthcare Providers: IncredibleEmployment.be  This test is not yet approved  or cleared by the [place] FDA and has been authorized for detection and/or diagnosis of SARS-CoV-2 by FDA under an Emergency Use Authorization (EUA). This EUA will remain in effect (meaning this test can be used) for the duration of the COVID-19 declaration under Section 564(b)(1) of the Act, 21 U.S.C. section 360bbb-3(b)(1), unless the authorization is terminated or revoked.  Performed at [facility], [place] [address] [place], [place] [address]   Surgical PCR screen     Status: None   Collection Time: 09/03/22  6:03 AM   Specimen: Nasal Mucosa; Nasal Swab  Result Value Ref Range Status   MRSA, PCR NEGATIVE NEGATIVE Final   Staphylococcus aureus NEGATIVE NEGATIVE Final    Comment: (NOTE) The Xpert SA Assay (FDA approved for NASAL specimens in patients 43 years of age and older), is one component of a comprehensive surveillance program. It is not intended to diagnose infection nor to guide or monitor treatment. Performed at [facility], [address] [place], [place] [address]   Aerobic/Anaerobic Culture w Gram Stain (surgical/deep wound)     Status: None   Collection Time: 09/04/22 10:44 AM   Specimen: Pleural Fluid  Result Value Ref Range Status   Specimen Description PLEURAL  Final   Special Requests RIGHT  Final   Gram Stain NO ORGANISMS SEEN NO WBC SEEN   Final   Culture   Final    No growth aerobically or anaerobically. Performed at [facility], [place] [address] [place], [place] [address]    Report Status 09/09/2022 FINAL  Final    Radiology Studies: No results found.  Marzetta Board, MD, PhD [facility]  Between 7 am - 7 pm I am available, please contact me via Amion (for emergencies) or Securechat (non urgent messages)  Between 7 pm - 7 am I am not available, please contact night coverage MD/APP via Amion

## 2022-09-11 NOTE — Progress Notes (Signed)
Patient reevaluated at bedside, rapid response was called due to increased respiratory distress.  On my arrival, she is tachypneic, tachycardic with A fib with RVR, and uncomfortable.  Throughout the day today she has had increased urinary retention requiring an in and out cath with 1.2 L output.  She is still unable to urinate well.  Chest x-ray stat shows a loculated right-sided hydropneumothorax which overall is stable, as well as airspace disease in the right mid and lower lungs compatible with her known pneumonia, but also slight fluid overload is possible.  Chest x-ray personally reviewed.  Will give 1 mg of morphine to improve dyspnea, Lasix 40 mg x 1, place a Foley catheter given diuresis and continue IV metoprolol for rate control given her A-fib with RVR.  Provide supplemental oxygen to maintain sats above 90%.  Patient's healthcare advocate [name] updated over the phone.  She tells me family will arrive tomorrow and possibly she will go home with hospice on Tuesday if all will be in agreement.  She understands that she took a turn for the worse this afternoon.  Will continue supportive care on the floor, avoid ICU transfer.  Might need to use a nonrebreather versus BiPAP later on but that can be done on a progressive floor if needed.  Janathan Bribiesca M. Cruzita Lederer, MD, PhD [facility]  Between 7 am - 7 pm you can contact me via Amion (for emergencies) or [place] (non urgent matters).  I am not available 7 pm - 7 am, please contact night coverage MD/APP via Amion

## 2022-09-11 NOTE — Progress Notes (Signed)
Bladder scan pt, she had 584 cc, notified Dr. Cruzita Lederer. MD said to start I&O protocol. Pt had 1200 cc with the in and out catheter. Urine is amber and clear. Will continue to monitor pt output during my shift. MD is aware and will do another in and out in 8 hours.

## 2022-09-11 NOTE — Plan of Care (Signed)
  Problem: Clinical Measurements: Goal: Respiratory complications will improve Outcome: Not Progressing   Problem: Activity: Goal: Risk for activity intolerance will decrease Outcome: Not Progressing   Problem: Coping: Goal: Level of anxiety will decrease Outcome: Not Progressing

## 2022-09-11 NOTE — Progress Notes (Signed)
Mobility Specialist Progress Note   09/11/22 1101  Mobility  Activity Transferred from bed to chair  Level of Assistance Minimal assist, patient does 75% or more  Assistive Device Front wheel walker  Distance Ambulated (ft) 15 ft  Range of Motion/Exercises Active;All extremities  Activity Response Tolerated fair;RN notified   Patient received in supine, presented A&Ox4 but anxious and slightly agitated. Was agreeable to participate but also fixated on bed being wet and unsure how it got there. Needed constant redirection of goals for session and cueing to stay on task. Expressed concerns of falling and safety. Reassured patient of precautions and usage of gait belt/AD to keep her safe. Required minimal HHA for bed mobility and stood with min A. Ambulated short distance in room to recliner chair with min A to min guard for safety. Needed cueing for hand placement and to stay proximal to RW. Patient deferred extending distance past the room door secondary to fatigue and generalized weakness. Tolerated without complaint or incident. Was left in recliner with all needs met, call bell in reach.   [place] Vernel Donlan, BS EXP Mobility Specialist Please contact via SecureChat or Rehab office at [phone]

## 2022-09-11 NOTE — Progress Notes (Signed)
Daily Progress Note   Patient Name: Gail Jennings       Date: 09/11/2022 DOB: 1933-05-02  Age: 89 y.o. MRN#: [id number] Attending Physician: Caren Griffins, MD Primary Care Physician: Burnard Bunting, MD Admit Date: 09/02/2022  Reason for Consultation/Follow-up: Establishing goals of care  Patient Profile/HPI: 89 y.o. female  with past medical history of HTN, PAF on Eliquis, and metastatic adenocarcinoma of the right lung with recurrent malignant effusion admitted on 09/02/2022 with shortness of breath, right middle lower lobe pneumonia as well as loculated pleural effusion on the right. IR placed chest tube 12/31. Receiving antibiotic therapy. Follows with Dr. Inda Merlin for metastatic lung cancer. PMT consulted to discuss [place].      Subjective: Xan is awake and alert. She is very weak and having "bladder problems". I introduced myself as palliative provider and her first response was "stop pushing me". Her niece was unable to arrive today due to inclement weather.   ROS   Physical Exam Vitals and nursing note reviewed.  Constitutional:      Appearance: She is ill-appearing.     Comments: frail  Pulmonary:     Effort: Pulmonary effort is normal.  Neurological:     Mental Status: Mental status is at baseline.             Vital Signs: BP (!) 156/111 (BP Location: Right Arm)   Pulse (!) 162   Temp 97.7 F (36.5 C) (Oral)   Resp (!) 30   Ht 5\' 4"  (1.626 m)   Wt 48.7 kg   SpO2 98%   BMI 18.43 kg/m  SpO2: SpO2: 98 % O2 Device: O2 Device: Nasal Cannula O2 Flow Rate: O2 Flow Rate (L/min): 2 L/min  Intake/output summary:  Intake/Output Summary (Last 24 hours) at 09/11/2022 1724 Last data filed at 09/11/2022 1402 Gross per 24 hour  Intake 100 ml  Output 1200 ml  Net -1100  ml   LBM: Last BM Date : 09/10/22 Baseline Weight: Weight: 45.4 kg Most recent weight: Weight: 48.7 kg       Palliative Assessment/Data: PPS: 50%      Patient Active Problem List   Diagnosis Date Noted   Malignant pleural effusion 09/08/2022   Empyema ([place]) 09/02/2022   Goals of care, counseling/discussion 08/10/2022   Primary lung adenocarcinoma, right ([place]) 07/17/2022  Atrial fibrillation with RVR ([place]) 07/09/2022   Respiratory distress 07/09/2022   Pleural effusion on right 07/09/2022   Essential hypertension 07/09/2022   Chronic hyponatremia 07/09/2022   Paroxysmal A-fib (H[facility])    History of left indirect inguinal hernia and lap repair left femoral hernia 11/22/2012    Palliative Care Assessment & Plan    Assessment/Recommendations/Plan  GOC - continue current plan of care- patient is trying to decide on disposition, she is feeling "pushed" and did not want to speak to Palliative provider today-  PMT will give patient space as per her request- will check in on Tuesday or Wednesday   Code Status: DNR  Prognosis:  Unable to determine  Discharge Planning: To Be Determined  Care plan was discussed with patient.   Thank you for allowing the Palliative Medicine Team to assist in the care of this patient.  Greater than 50%  of this time was spent counseling and coordinating care related to the above assessment and plan.  Mariana Kaufman, AGNP-C Palliative Medicine   Please contact Palliative Medicine Team phone at [phone] for questions and concerns.

## 2022-09-12 ENCOUNTER — Encounter: Payer: Self-pay | Admitting: Pulmonary Disease

## 2022-09-12 ENCOUNTER — Encounter: Payer: Self-pay | Admitting: Internal Medicine

## 2022-09-12 ENCOUNTER — Telehealth: Payer: Self-pay | Admitting: Internal Medicine

## 2022-09-12 ENCOUNTER — Encounter: Payer: Self-pay | Admitting: Nurse Practitioner

## 2022-09-12 DIAGNOSIS — Z7189 Other specified counseling: Secondary | ICD-10-CM | POA: Diagnosis not present

## 2022-09-12 DIAGNOSIS — J910 Malignant pleural effusion: Secondary | ICD-10-CM | POA: Diagnosis not present

## 2022-09-12 DIAGNOSIS — C3491 Malignant neoplasm of unspecified part of right bronchus or lung: Secondary | ICD-10-CM | POA: Diagnosis not present

## 2022-09-12 DIAGNOSIS — J869 Pyothorax without fistula: Secondary | ICD-10-CM | POA: Diagnosis not present

## 2022-09-12 LAB — BASIC METABOLIC PANEL
Anion gap: 10 (ref 5–15)
BUN: 18 mg/dL (ref 8–23)
CO2: 24 mmol/L (ref 22–32)
Calcium: 7.9 mg/dL — ABNORMAL LOW (ref 8.9–10.3)
Chloride: 99 mmol/L (ref 98–111)
Creatinine, Ser: 0.52 mg/dL (ref 0.44–1.00)
GFR, Estimated: >60 mL/min (ref >60)
Glucose, Bld: 128 mg/dL — ABNORMAL HIGH (ref 70–99)
Potassium: 3.1 mmol/L — ABNORMAL LOW (ref 3.5–5.1)
Sodium: 133 mmol/L — ABNORMAL LOW (ref 135–145)

## 2022-09-12 LAB — CBC
HCT: 31.2 % — ABNORMAL LOW (ref 36.0–46.0)
Hemoglobin: 10 g/dL — ABNORMAL LOW (ref 12.0–15.0)
MCH: 30.4 pg (ref 26.0–34.0)
MCHC: 32.1 g/dL (ref 30.0–36.0)
MCV: 94.8 fL (ref 80.0–100.0)
Platelets: 508 10*3/uL — ABNORMAL HIGH (ref 150–400)
RBC: 3.29 MIL/uL — ABNORMAL LOW (ref 3.87–5.11)
RDW: 14.8 % (ref 11.5–15.5)
WBC: 16 10*3/uL — ABNORMAL HIGH (ref 4.0–10.5)
nRBC: 0 % (ref 0.0–0.2)

## 2022-09-12 MED ORDER — DM-GUAIFENESIN ER 30-600 MG PO TB12
1.0000 | ORAL_TABLET | Freq: Two times a day (BID) | ORAL | Status: DC
  Administered 2022-09-12 – 2022-09-14 (×5): 1 via ORAL
  Filled 2022-09-12 (×8): qty 1

## 2022-09-12 MED ORDER — STERILE WATER FOR INJECTION IJ SOLN
INTRAMUSCULAR | Status: AC
  Filled 2022-09-12: qty 10

## 2022-09-12 MED ORDER — OXYMETAZOLINE HCL 0.05 % NA SOLN
1.0000 | Freq: Two times a day (BID) | NASAL | Status: AC
  Administered 2022-09-12 – 2022-09-14 (×5): 1 via NASAL
  Filled 2022-09-12 (×2): qty 30

## 2022-09-12 MED ORDER — FUROSEMIDE 10 MG/ML IJ SOLN
40.0000 mg | Freq: Once | INTRAMUSCULAR | Status: AC
  Administered 2022-09-12: 40 mg via INTRAVENOUS
  Filled 2022-09-12: qty 4

## 2022-09-12 MED ORDER — LEVALBUTEROL HCL 0.63 MG/3ML IN NEBU
0.6300 mg | INHALATION_SOLUTION | Freq: Four times a day (QID) | RESPIRATORY_TRACT | Status: DC
  Administered 2022-09-13 (×2): 0.63 mg via RESPIRATORY_TRACT
  Filled 2022-09-12 (×4): qty 3

## 2022-09-12 MED ORDER — POTASSIUM CHLORIDE CRYS ER 20 MEQ PO TBCR
40.0000 meq | EXTENDED_RELEASE_TABLET | ORAL | Status: DC
  Filled 2022-09-12: qty 2

## 2022-09-12 MED ORDER — POTASSIUM CHLORIDE 20 MEQ PO PACK
40.0000 meq | PACK | Freq: Once | ORAL | Status: AC
  Administered 2022-09-12: 40 meq via ORAL
  Filled 2022-09-12: qty 2

## 2022-09-12 NOTE — Progress Notes (Signed)
PROGRESS NOTE  HILDA RYNDERS ZES:[id number] DOB: 03/08/1933 DOA: 09/02/2022 PCP: Burnard Bunting, MD   LOS: 10 days   Brief Narrative / Interim history: 89 year old female with history of HTN, PAF on Eliquis, metastatic adenocarcinoma of the right lung with recurrent malignant effusion comes into the hospital with shortness of breath, right middle lower lobe pneumonia as well as loculated pleural effusion on the right.  She presented to [facility] and transferred to [facility] for cardiothoracic surgery evaluation, but upon arrival to [facility] this was deferred to IR who placed the chest tube on 12/31.  Unfortunately cell count was not sent and cultures are pending  Subjective / 24h Interval events: Appears more comfortable today.  Family coming from [place] this evening  Assesement and Plan: Principal Problem:   Empyema ([place]) Active Problems:   Essential hypertension   Primary lung adenocarcinoma, right ([place])   Goals of care, counseling/discussion   Malignant pleural effusion  Principal problem Loculated pleural effusion, status post chest tube, acute hypoxic respiratory failure- this was placed by IR on 12/31, PCCM consulted and following, status post lytics 09/07/2022.  Chest tube discontinued 1/4, she has been placed on prolonged antibiotics with Augmentin -She has been having worsening respiratory status since yesterday, suspect in the setting of known underlying pneumonia and known underlying malignant pleural effusion.  No good solution here, palliative care consulted.  She likely is to go home with hospice but she does not feel ready to make any decisions at this point  Active problems Right middle and lower lobe pneumonia-noted on imaging, antibiotics per ID, currently on Augmentin  Metastatic adenocarcinoma of the right lung, malignant pleural effusion-follows with Dr. Julien Nordmann as an outpatient.  Treatment is on hold due to acute infection.  I have consulted  palliative care as overall she has a very poor prognosis with stage IV cancer and active infection.  Meeting with palliative, will meet family tomorrow  Aortic arch moral thrombus-she is already on anticoagulation for PAF, chest tube discontinued, now on Eliquis  Acute hypoxic respiratory failure-due to pneumonia, recurrent pleural effusion.  Likely needs oxygen on discharge  PAF with RVR-anticoagulated with heparin, continue metoprolol  Thrombocytosis-likely reactive  Severe protein malnutrition-BMI 18, due to malignancy  Essential hypertension-continue metoprolol, blood pressure is stable  Goals of care/disposition-awaiting family discussion once they arrive in [place], likely tomorrow  Scheduled Meds:  amoxicillin-clavulanate  1 tablet Oral Q12H   apixaban  5 mg Oral BID   benzonatate  200 mg Oral TID   dextromethorphan-guaiFENesin  1 tablet Oral BID   folic acid  1 mg Oral Daily   metoprolol tartrate  37.5 mg Oral BID   pneumococcal 20-valent conjugate vaccine  0.5 mL Intramuscular Tomorrow-1000   potassium chloride  40 mEq Oral Q3H   senna-docusate  2 tablet Oral BID   Continuous Infusions:   PRN Meds:.hydrOXYzine, levalbuterol, melatonin, metoprolol tartrate, morphine injection, prochlorperazine  Current Outpatient Medications  Medication Instructions   apixaban (ELIQUIS) 2.5 mg, Oral, 2 times daily   folic acid (FOLVITE) 1 mg, Oral, Daily   metoprolol tartrate (LOPRESSOR) 12.5 mg, Oral, 2 times daily   ondansetron (ZOFRAN) 8 mg, Oral, Every 8 hours PRN, Start on day 3 after carboplatin.   prochlorperazine (COMPAZINE) 10 mg, Oral, Every 6 hours PRN    Diet Orders (From admission, onward)     Start     Ordered   09/04/22 1137  Diet regular Room service appropriate? Yes; Fluid consistency: Thin  Diet  effective now       Question Answer Comment  Room service appropriate? Yes   Fluid consistency: Thin      09/04/22 1136            DVT prophylaxis: SCDs  Start: 09/02/22 2016 apixaban (ELIQUIS) tablet 5 mg   Lab Results  Component Value Date   PLT 508 (H) 09/12/2022      Code Status: DNR  Family Communication: No family at bedside this morning  Status is: Inpatient  Remains inpatient appropriate because: Ongoing goals of care/disposition discussions  Level of care: Telemetry Medical  Consultants:  TCS IR ID  Objective: Vitals:   09/12/22 0505 09/12/22 0512 09/12/22 0804 09/12/22 1013  BP: 93/69 99/63 (!) 102/46 98/62  Pulse: (!) 104 96 (!) 122 85  Resp:   17   Temp: 98.2 F (36.8 C)  97.7 F (36.5 C)   TempSrc: Oral  Oral   SpO2: (!) 80% 100% 98%   Weight:      Height:        Intake/Output Summary (Last 24 hours) at 09/12/2022 1055 Last data filed at 09/12/2022 0544 Gross per 24 hour  Intake --  Output 3300 ml  Net -3300 ml    Wt Readings from Last 3 Encounters:  09/02/22 48.7 kg  08/23/22 47.8 kg  08/17/22 46 kg    Examination: Constitutional: NAD Eyes: lids and conjunctivae normal, no scleral icterus ENMT: mmm Neck: normal, supple Respiratory: Rhonchi present on the right lung field, no wheezing Cardiovascular: Regular rate and rhythm, no murmurs / rubs / gallops. No LE edema. Abdomen: soft, no distention, no tenderness. Bowel sounds positive.   Data Reviewed: I have independently reviewed following labs and imaging studies   CBC Recent Labs  Lab 09/08/22 0227 09/09/22 0412 09/10/22 0256 09/11/22 0211 09/12/22 0245  WBC 13.9* 14.5* 13.6* 14.3* 16.0*  HGB 9.9* 10.2* 10.0* 10.2* 10.0*  HCT 31.0* 31.1* 30.5* 31.0* 31.2*  PLT 805* 726* 658* 564* 508*  MCV 95.4 93.4 93.8 93.1 94.8  MCH 30.5 30.6 30.8 30.6 30.4  MCHC 31.9 32.8 32.8 32.9 32.1  RDW 13.9 14.2 14.6 14.9 14.8     Recent Labs  Lab 09/08/22 0227 09/09/22 0412 09/10/22 0256 09/12/22 0245  NA 132* 134* 134* 133*  K 3.3* 3.6 3.9 3.1*  CL 101 103 105 99  CO2 20* 22 21* 24  GLUCOSE 112* 109* 104* 128*  BUN 26* 22 17 18    CREATININE 0.66 0.60 0.53 0.52  CALCIUM 8.2* 8.1* 8.1* 7.9*  AST 19  --   --   --   ALT 14  --   --   --   ALKPHOS 63  --   --   --   BILITOT 0.6  --   --   --   ALBUMIN 1.6*  --   --   --   MG 1.7 1.7 1.6*  --      ------------------------------------------------------------------------------------------------------------------ No results for input(s): "CHOL", "HDL", "LDLCALC", "TRIG", "CHOLHDL", "LDLDIRECT" in the last 72 hours.  No results found for: "HGBA1C" ------------------------------------------------------------------------------------------------------------------ No results for input(s): "TSH", "T4TOTAL", "T3FREE", "THYROIDAB" in the last 72 hours.  Invalid input(s): "FREET3"  Cardiac Enzymes No results for input(s): "CKMB", "TROPONINI", "MYOGLOBIN" in the last 168 hours.  Invalid input(s): "CK" ------------------------------------------------------------------------------------------------------------------    Component Value Date/Time   BNP 120.5 (H) 09/02/2022 1400    CBG: Recent Labs  Lab 09/09/22 0829 09/09/22 1143 09/09/22 1635  GLUCAP 110* 89 104*  Recent Results (from the past 240 hour(s))  Resp panel by RT-PCR (RSV, Flu A&B, Covid) Anterior Nasal Swab     Status: None   Collection Time: 09/02/22  1:22 PM   Specimen: Anterior Nasal Swab  Result Value Ref Range Status   SARS Coronavirus 2 by RT PCR NEGATIVE NEGATIVE Final    Comment: (NOTE) SARS-CoV-2 target nucleic acids are NOT DETECTED.  The SARS-CoV-2 RNA is generally detectable in upper respiratory specimens during the acute phase of infection. The lowest concentration of SARS-CoV-2 viral copies this assay can detect is 138 copies/mL. A negative result does not preclude SARS-Cov-2 infection and should not be used as the sole basis for treatment or other patient management decisions. A negative result may occur with  improper specimen collection/handling, submission of specimen  other than nasopharyngeal swab, presence of viral mutation(s) within the areas targeted by this assay, and inadequate number of viral copies(<138 copies/mL). A negative result must be combined with clinical observations, patient history, and epidemiological information. The expected result is Negative.  Fact Sheet for Patients:  EntrepreneurPulse.com.au  Fact Sheet for Healthcare Providers:  IncredibleEmployment.be  This test is no t yet approved or cleared by the [place] FDA and  has been authorized for detection and/or diagnosis of SARS-CoV-2 by FDA under an Emergency Use Authorization (EUA). This EUA will remain  in effect (meaning this test can be used) for the duration of the COVID-19 declaration under Section 564(b)(1) of the Act, 21 U.S.C.section 360bbb-3(b)(1), unless the authorization is terminated  or revoked sooner.       Influenza A by PCR NEGATIVE NEGATIVE Final   Influenza B by PCR NEGATIVE NEGATIVE Final    Comment: (NOTE) The Xpert Xpress SARS-CoV-2/FLU/RSV plus assay is intended as an aid in the diagnosis of influenza from Nasopharyngeal swab specimens and should not be used as a sole basis for treatment. Nasal washings and aspirates are unacceptable for Xpert Xpress SARS-CoV-2/FLU/RSV testing.  Fact Sheet for Patients: EntrepreneurPulse.com.au  Fact Sheet for Healthcare Providers: IncredibleEmployment.be  This test is not yet approved or cleared by the [place] FDA and has been authorized for detection and/or diagnosis of SARS-CoV-2 by FDA under an Emergency Use Authorization (EUA). This EUA will remain in effect (meaning this test can be used) for the duration of the COVID-19 declaration under Section 564(b)(1) of the Act, 21 U.S.C. section 360bbb-3(b)(1), unless the authorization is terminated or revoked.     Resp Syncytial Virus by PCR NEGATIVE NEGATIVE Final     Comment: (NOTE) Fact Sheet for Patients: EntrepreneurPulse.com.au  Fact Sheet for Healthcare Providers: IncredibleEmployment.be  This test is not yet approved or cleared by the [place] FDA and has been authorized for detection and/or diagnosis of SARS-CoV-2 by FDA under an Emergency Use Authorization (EUA). This EUA will remain in effect (meaning this test can be used) for the duration of the COVID-19 declaration under Section 564(b)(1) of the Act, 21 U.S.C. section 360bbb-3(b)(1), unless the authorization is terminated or revoked.  Performed at [facility], [place] [address] [place], [place] [address]   Surgical PCR screen     Status: None   Collection Time: 09/03/22  6:03 AM   Specimen: Nasal Mucosa; Nasal Swab  Result Value Ref Range Status   MRSA, PCR NEGATIVE NEGATIVE Final   Staphylococcus aureus NEGATIVE NEGATIVE Final    Comment: (NOTE) The Xpert SA Assay (FDA approved for NASAL specimens in patients 63 years of age and older), is one component of a  comprehensive surveillance program. It is not intended to diagnose infection nor to guide or monitor treatment. Performed at [facility], [place] [address], [place], [place] [address]   Aerobic/Anaerobic Culture w Gram Stain (surgical/deep wound)     Status: None   Collection Time: 09/04/22 10:44 AM   Specimen: Pleural Fluid  Result Value Ref Range Status   Specimen Description PLEURAL  Final   Special Requests RIGHT  Final   Gram Stain NO ORGANISMS SEEN NO WBC SEEN   Final   Culture   Final    No growth aerobically or anaerobically. Performed at [facility], [place] [address], [place], [place] [address]    Report Status 09/09/2022 FINAL  Final    Radiology Studies: DG CHEST PORT 1 VIEW  Result Date: 09/11/2022 CLINICAL DATA:  Dyspnea. EXAM: PORTABLE CHEST 1 VIEW COMPARISON:  09/08/2022 FINDINGS: Stable cardiomediastinal contours. Aortic  atherosclerosis. Loculated right-sided hydropneumothorax is stable to mildly increased in the interval. Asymmetric increase interstitial opacities within the right lung noted with progressive airspace disease in the right mid and right lower lung. IMPRESSION: 1. There is a loculated right-sided hydropneumothorax with gas seen within the basilar component. This appears stable to mildly increased in the interval. 2. Progressive airspace disease within the right mid and right lower lung compatible with pneumonia. Electronically Signed   By: Kerby Moors M.D.   On: 09/11/2022 17:37    Marzetta Board, MD, PhD [facility]  Between 7 am - 7 pm I am available, please contact me via Amion (for emergencies) or Securechat (non urgent messages)  Between 7 pm - 7 am I am not available, please contact night coverage MD/APP via Amion

## 2022-09-12 NOTE — Progress Notes (Signed)
Physical Therapy Treatment Patient Details Name: EMARY ZALAR MRN: [id number] DOB: 21-Jan-1933 Today's Date: 09/12/2022   History of Present Illness Patient is a 89 year old female with Loculated pleural effusion s/p chest tube placement on 12/31, pneumonia, acute respiratory failure with hypoxia, A-fib with RVR. History of metastatic adenocarcinoma of right lung with chemotherapy.    PT Comments    Patient is more cooperative and much less agitated than previous sessions. She declined getting out of bed despite offering multiple times. Patient reports she is too fatigued and weak to attempt walking today. She was agreeable to in bed exercises for strengthening which were performed on BLE with AAROM- AROM as able. Sp02 92% or higher on supplemental oxygen. Continue to recommend SNF for rehab needs pending patient/family goals of care. Noted Palliative Care meeting scheduled for tomorrow. PT will continue to follow.    Recommendations for follow up therapy are one component of a multi-disciplinary discharge planning process, led by the attending physician.  Recommendations may be updated based on patient status, additional functional criteria and insurance authorization.  Follow Up Recommendations  Skilled nursing-short term rehab (<3 hours/day) Can patient physically be transported by private vehicle: No   Assistance Recommended at Discharge Frequent or constant Supervision/Assistance  Patient can return home with the following A little help with walking and/or transfers;A little help with bathing/dressing/bathroom;Assistance with feeding;Assist for transportation;Help with stairs or ramp for entrance   Equipment Recommendations  Rolling walker (2 wheels)    Recommendations for Other Services       Precautions / Restrictions Precautions Precautions: Fall Restrictions Weight Bearing Restrictions: No     Mobility  Bed Mobility               General bed mobility comments:  patient declined due to fatigue    Transfers                   General transfer comment: offered several times to assist patient out of bed, however she declined due to generalized weakness and fatigue. she reports she is not ready for ambulation yet    Ambulation/Gait                   Stairs             Wheelchair Mobility    Modified Rankin (Stroke Patients Only)       Balance                                            Cognition Arousal/Alertness: Awake/alert Behavior During Therapy: WFL for tasks assessed/performed Overall Cognitive Status: No family/caregiver present to determine baseline cognitive functioning                       Memory: Decreased short-term memory Following Commands: Follows one step commands consistently       General Comments: patient is much less anxious than previous sessions. she was cooperative with bed level exercise, but is mildly agitated when asked about getting out of bed        Exercises General Exercises - Lower Extremity Ankle Circles/Pumps: AROM, Strengthening, Both, 15 reps, Supine Heel Slides: AAROM, Strengthening, Both, 10 reps, Supine, AROM Hip ABduction/ADduction: AAROM, Strengthening, Both, 10 reps, Supine, AROM Straight Leg Raises: AAROM, Strengthening, Both, 10 reps, Supine, AROM Other Exercises Other Exercises: towel squeeze BLE  x 10 reps with AROM. attempted to provide light manual resistance as tolerated with exercise, however unable to as patient required AAROM- AROM against gravity only. Verbal and visual cues for exercise technique.     General Comments General comments (skin integrity, edema, etc.): patient was agreeable to in bed exercise for strengthening. Sp02 92% on higher on supplemental oxygen      Pertinent Vitals/Pain Pain Assessment Pain Assessment: No/denies pain    Home Living                          Prior Function            PT  Goals (current goals can now be found in the care plan section) Acute Rehab PT Goals Patient Stated Goal: to make a plan when her niece arrives PT Goal Formulation: With patient Time For Goal Achievement: 09/21/22 Potential to Achieve Goals: Fair Progress towards PT goals: Progressing toward goals    Frequency    Min 3X/week      PT Plan Current plan remains appropriate    Co-evaluation              AM-PAC PT "6 Clicks" Mobility   Outcome Measure  Help needed turning from your back to your side while in a flat bed without using bedrails?: A Little Help needed moving from lying on your back to sitting on the side of a flat bed without using bedrails?: A Little Help needed moving to and from a bed to a chair (including a wheelchair)?: A Lot Help needed standing up from a chair using your arms (e.g., wheelchair or bedside chair)?: A Lot Help needed to walk in hospital room?: A Lot Help needed climbing 3-5 steps with a railing? : A Lot 6 Click Score: 14    End of Session Equipment Utilized During Treatment: Oxygen Activity Tolerance: Patient limited by fatigue Patient left: in bed;with call bell/phone within reach;with bed alarm set Nurse Communication: Mobility status PT Visit Diagnosis: Other abnormalities of gait and mobility (R26.89);Difficulty in walking, not elsewhere classified (R26.2)     Time: 6948-5462 PT Time Calculation (min) (ACUTE ONLY): 14 min  Charges:  $Therapeutic Exercise: 8-22 mins                    Minna Merritts, PT, MPT    Percell Locus 09/12/2022, 2:14 PM

## 2022-09-12 NOTE — Progress Notes (Signed)
Daily Progress Note   Patient Name: Gail Jennings       Date: 09/12/2022 DOB: 1932/10/01  Age: 89 y.o. MRN#: [id number] Attending Physician: Caren Griffins, MD Primary Care Physician: Burnard Bunting, MD Admit Date: 09/02/2022  Reason for Consultation/Follow-up: Establishing goals of care  Patient Profile/HPI: 89 y.o. female  with past medical history of HTN, PAF on Eliquis, and metastatic adenocarcinoma of the right lung with recurrent malignant effusion admitted on 09/02/2022 with shortness of breath, right middle lower lobe pneumonia as well as loculated pleural effusion on the right. IR placed chest tube 12/31. Receiving antibiotic therapy. Follows with Dr. Inda Merlin for metastatic lung cancer. PMT consulted to discuss [place].      Subjective: Ursala is awake and alert. She continues to be very weak.  Complains of feeling full. LBM documented today. Foley catheter placed today.  Her niece is en route from [place]. Expected arrival tonight.  Call from patient's friend [name]- she asked if patient could have hired Actuary at bedside. Discussed with [name] the hospital does not provide sitter service unless patient is danger to self- but family is welcome to hire someone privately to sit with patient- they would have to obtain on their own.   Review of Systems  Constitutional:  Positive for malaise/fatigue.     Physical Exam Vitals and nursing note reviewed.  Constitutional:      Appearance: She is ill-appearing.     Comments: frail  Pulmonary:     Effort: Pulmonary effort is normal.  Neurological:     Mental Status: Mental status is at baseline.             Vital Signs: BP 98/62   Pulse 85   Temp 97.7 F (36.5 C) (Oral)   Resp 17   Ht 5\' 4"  (1.626 m)   Wt 48.7 kg    SpO2 98%   BMI 18.43 kg/m  SpO2: SpO2: 98 % O2 Device: O2 Device: Nasal Cannula O2 Flow Rate: O2 Flow Rate (L/min): 2 L/min  Intake/output summary:  Intake/Output Summary (Last 24 hours) at 09/12/2022 1457 Last data filed at 09/12/2022 0544 Gross per 24 hour  Intake --  Output 2100 ml  Net -2100 ml    LBM: Last BM Date : 09/12/22 Baseline Weight: Weight: 45.4 kg Most recent weight: Weight:  48.7 kg       Palliative Assessment/Data: PPS: 30%      Patient Active Problem List   Diagnosis Date Noted   Malignant pleural effusion 09/08/2022   Empyema ([place]) 09/02/2022   Goals of care, counseling/discussion 08/10/2022   Primary lung adenocarcinoma, right ([place]) 07/17/2022   Atrial fibrillation with RVR ([place]) 07/09/2022   Respiratory distress 07/09/2022   Pleural effusion on right 07/09/2022   Essential hypertension 07/09/2022   Chronic hyponatremia 07/09/2022   Paroxysmal A-fib ([place])    History of left indirect inguinal hernia and lap repair left femoral hernia 11/22/2012    Palliative Care Assessment & Plan    Assessment/Recommendations/Plan  GOC - continue current plan of care- patient is trying to decide on disposition, patient's niece planned to arrive tonight for further discussion  Code Status: DNR  Prognosis:  Unable to determine  Discharge Planning: To Be Determined  Care plan was discussed with patient.   Total time: 45 minutes  Thank you for allowing the Palliative Medicine Team to assist in the care of this patient.  Greater than 50%  of this time was spent counseling and coordinating care related to the above assessment and plan.  Mariana Kaufman, AGNP-C Palliative Medicine   Please contact Palliative Medicine Team phone at [phone] for questions and concerns.

## 2022-09-12 NOTE — Progress Notes (Signed)
Held metoprolol at 2200. Pt stable and requested to not take anything else by mouth. Pt just received a dose of metoprolol at 1805. Pt's heart rate started to elevate thismorning at 459  and 2.5 mg of metoprolol IV  was given.

## 2022-09-12 NOTE — Telephone Encounter (Signed)
Spoke with [name] regarding patient's upcoming appointments. [name] informed me she will no longer continue to see Dr. Julien Nordmann and will enter hospice care.

## 2022-09-13 DIAGNOSIS — Z7189 Other specified counseling: Secondary | ICD-10-CM | POA: Diagnosis not present

## 2022-09-13 DIAGNOSIS — Z515 Encounter for palliative care: Secondary | ICD-10-CM | POA: Diagnosis not present

## 2022-09-13 DIAGNOSIS — J910 Malignant pleural effusion: Secondary | ICD-10-CM | POA: Diagnosis not present

## 2022-09-13 DIAGNOSIS — J869 Pyothorax without fistula: Secondary | ICD-10-CM | POA: Diagnosis not present

## 2022-09-13 DIAGNOSIS — C3491 Malignant neoplasm of unspecified part of right bronchus or lung: Secondary | ICD-10-CM | POA: Diagnosis not present

## 2022-09-13 LAB — CBC
HCT: 28.1 % — ABNORMAL LOW (ref 36.0–46.0)
Hemoglobin: 9.2 g/dL — ABNORMAL LOW (ref 12.0–15.0)
MCH: 30.9 pg (ref 26.0–34.0)
MCHC: 32.7 g/dL (ref 30.0–36.0)
MCV: 94.3 fL (ref 80.0–100.0)
Platelets: 428 10*3/uL — ABNORMAL HIGH (ref 150–400)
RBC: 2.98 MIL/uL — ABNORMAL LOW (ref 3.87–5.11)
RDW: 15.1 % (ref 11.5–15.5)
WBC: 13.2 10*3/uL — ABNORMAL HIGH (ref 4.0–10.5)
nRBC: 0 % (ref 0.0–0.2)

## 2022-09-13 LAB — BASIC METABOLIC PANEL
Anion gap: 9 (ref 5–15)
BUN: 16 mg/dL (ref 8–23)
CO2: 27 mmol/L (ref 22–32)
Calcium: 7.8 mg/dL — ABNORMAL LOW (ref 8.9–10.3)
Chloride: 99 mmol/L (ref 98–111)
Creatinine, Ser: 0.47 mg/dL (ref 0.44–1.00)
GFR, Estimated: >60 mL/min (ref >60)
Glucose, Bld: 113 mg/dL — ABNORMAL HIGH (ref 70–99)
Potassium: 3.4 mmol/L — ABNORMAL LOW (ref 3.5–5.1)
Sodium: 135 mmol/L (ref 135–145)

## 2022-09-13 LAB — GLUCOSE, CAPILLARY: Glucose-Capillary: 99 mg/dL (ref 70–99)

## 2022-09-13 LAB — MAGNESIUM: Magnesium: 1.4 mg/dL — ABNORMAL LOW (ref 1.7–2.4)

## 2022-09-13 MED ORDER — MAGNESIUM SULFATE 2 GM/50ML IV SOLN
2.0000 g | Freq: Once | INTRAVENOUS | Status: AC
  Administered 2022-09-13: 2 g via INTRAVENOUS
  Filled 2022-09-13: qty 50

## 2022-09-13 MED ORDER — POTASSIUM CHLORIDE 20 MEQ PO PACK
40.0000 meq | PACK | Freq: Once | ORAL | Status: AC
  Administered 2022-09-13: 40 meq via ORAL
  Filled 2022-09-13: qty 2

## 2022-09-13 MED ORDER — LEVALBUTEROL HCL 1.25 MG/0.5ML IN NEBU
1.2500 mg | INHALATION_SOLUTION | RESPIRATORY_TRACT | Status: DC | PRN
  Administered 2022-09-15: 1.25 mg via RESPIRATORY_TRACT
  Filled 2022-09-13: qty 0.5

## 2022-09-13 MED ORDER — CHLORHEXIDINE GLUCONATE CLOTH 2 % EX PADS
6.0000 | MEDICATED_PAD | Freq: Every day | CUTANEOUS | Status: DC
  Administered 2022-09-13: 6 via TOPICAL

## 2022-09-13 MED ORDER — LEVALBUTEROL HCL 0.63 MG/3ML IN NEBU
0.6300 mg | INHALATION_SOLUTION | RESPIRATORY_TRACT | Status: DC
  Administered 2022-09-13 – 2022-09-15 (×8): 0.63 mg via RESPIRATORY_TRACT
  Filled 2022-09-13 (×14): qty 3

## 2022-09-13 NOTE — Progress Notes (Signed)
Chaplain met pt's niece at ED entrance and escorted her to pt's bedside.  Chaplain oriented niece to hospital.  Chaplain on standby should further assistance needed.    [place]

## 2022-09-13 NOTE — Progress Notes (Signed)
Per family request pt not needing tx at this time due to restlessness. Adv will call if needed. Pt on room air and stable at this time.

## 2022-09-13 NOTE — Progress Notes (Signed)
   09/13/22 0028  Assess: MEWS Score  Temp 97.9 F (36.6 C)  BP 97/71  MAP (mmHg) 79  Pulse Rate (!) 129  Resp 17  SpO2 99 %  O2 Device Nasal Cannula  O2 Flow Rate (L/min) 3 L/min  Assess: MEWS Score  MEWS Temp 0  MEWS Systolic 1  MEWS Pulse 2  MEWS RR 0  MEWS LOC 0  MEWS Score 3  MEWS Score Color Yellow  Assess: if the MEWS score is Yellow or Red  Were vital signs taken at a resting state? Yes  Focused Assessment No change from prior assessment  Does the patient meet 2 or more of the SIRS criteria? No  Does the patient have a confirmed or suspected source of infection? No  Provider and Rapid Response Notified? No  MEWS guidelines implemented *See Row Information* No, previously yellow, continue vital signs every 4 hours  Notify: Charge Nurse/RN  Name of Charge Nurse/RN Notified [place]  Date Charge Nurse/RN Notified 09/13/22  Time Charge Nurse/RN Notified 0350  Document  Patient Outcome Other (Comment) (Pt is going home on hospice care in am)  Progress note created (see row info) Yes  Assess: SIRS CRITERIA  SIRS Temperature  0  SIRS Pulse 1  SIRS Respirations  0  SIRS WBC 0  SIRS Score Sum  1

## 2022-09-13 NOTE — Progress Notes (Signed)
PROGRESS NOTE  Gail Jennings:[id number] DOB: Jan 27, 1933 DOA: 09/02/2022 PCP: Burnard Bunting, MD   LOS: 11 days   Brief Narrative / Interim history: 89 year old female with history of HTN, PAF on Eliquis, metastatic adenocarcinoma of the right lung with recurrent malignant effusion comes into the hospital with shortness of breath, right middle lower lobe pneumonia as well as loculated pleural effusion on the right.  She presented to [facility] and transferred to [facility] for cardiothoracic surgery evaluation, but upon arrival to [facility] this was deferred to IR who placed the chest tube on 12/31.  Eventually PCCM was consulted, underwent lytic therapy x 1, but given that the chest tube had little to no output it was removed.  ID was also consulted recommending prolonged antibiotics.  Subjective / 24h Interval events: Niece has arrived in town last night.  She is at bedside.  Patient with significant anxiety about what will happen once he leaves the hospital.  Assesement and Plan: Principal Problem:   Empyema ([place]) Active Problems:   Essential hypertension   Primary lung adenocarcinoma, right ([place])   Goals of care, counseling/discussion   Malignant pleural effusion  Principal problem Acute hypoxic respiratory failure, loculated pleural effusion, status post chest tube- this was placed by IR on 12/31, PCCM consulted and following, status post lytics 09/07/2022.  Chest tube discontinued 1/4, she has been placed on prolonged antibiotics with Augmentin -Her respiratory status is tenuous due to underlying pneumonia and underlying known infected malignant pleural effusion.  There is not a good solution here, palliative care consulted.  She is likely to go home with hospice, however has significant anxiety about leaving the hospital and the type of care that she received from hospice nurses.  Family is now arranging 24/7 care.  They are also discussing whether she will be  staying here in [place] with hospice/care versus in [place].  Niece has looked into air ambulance in case she will be discharged out of state  Active problems Right middle and lower lobe pneumonia-noted on imaging, antibiotics per ID, currently on Augmentin  Metastatic adenocarcinoma of the right lung, malignant pleural effusion-follows with Dr. Julien Nordmann as an outpatient.  Treatment is on hold due to acute infection.  I have consulted palliative care as overall she has a very poor prognosis with stage IV cancer and active infection.  Appreciate palliative care follow-up  Aortic arch moral thrombus-she is already on anticoagulation for PAF, chest tube discontinued, now on Eliquis  Acute hypoxic respiratory failure-due to pneumonia, recurrent pleural effusion.  Likely needs oxygen on discharge  PAF with RVR-anticoagulated with heparin, continue metoprolol  Thrombocytosis-likely reactive  Severe protein malnutrition-BMI 18, due to malignancy  Essential hypertension-continue metoprolol, blood pressure is stable  Goals of care/disposition-ongoing  Scheduled Meds:  amoxicillin-clavulanate  1 tablet Oral Q12H   apixaban  5 mg Oral BID   benzonatate  200 mg Oral TID   Chlorhexidine Gluconate Cloth  6 each Topical Daily   dextromethorphan-guaiFENesin  1 tablet Oral BID   folic acid  1 mg Oral Daily   levalbuterol  0.63 mg Nebulization Q6H WA   metoprolol tartrate  37.5 mg Oral BID   oxymetazoline  1 spray Each Nare BID   pneumococcal 20-valent conjugate vaccine  0.5 mL Intramuscular Tomorrow-1000   senna-docusate  2 tablet Oral BID   Continuous Infusions:   PRN Meds:.hydrOXYzine, levalbuterol, melatonin, metoprolol tartrate, morphine injection, prochlorperazine  Current Outpatient Medications  Medication Instructions   apixaban (ELIQUIS) 2.5 mg,  Oral, 2 times daily   folic acid (FOLVITE) 1 mg, Oral, Daily   metoprolol tartrate (LOPRESSOR) 12.5 mg, Oral, 2 times daily    ondansetron (ZOFRAN) 8 mg, Oral, Every 8 hours PRN, Start on day 3 after carboplatin.   prochlorperazine (COMPAZINE) 10 mg, Oral, Every 6 hours PRN    Diet Orders (From admission, onward)     Start     Ordered   09/04/22 1137  Diet regular Room service appropriate? Yes; Fluid consistency: Thin  Diet effective now       Question Answer Comment  Room service appropriate? Yes   Fluid consistency: Thin      09/04/22 1136            DVT prophylaxis: SCDs Start: 09/02/22 2016 apixaban (ELIQUIS) tablet 5 mg   Lab Results  Component Value Date   PLT 428 (H) 09/13/2022      Code Status: DNR  Family Communication: No family at bedside this morning  Status is: Inpatient  Remains inpatient appropriate because: Ongoing goals of care/disposition discussions  Level of care: Telemetry Medical  Consultants:  TCS IR ID  Objective: Vitals:   09/13/22 0454 09/13/22 0732 09/13/22 0753 09/13/22 0916  BP: 121/79   110/69  Pulse: (!) 119 (!) 114  97  Resp: 18   16  Temp: 98.5 F (36.9 C)   98.4 F (36.9 C)  TempSrc: Oral   Oral  SpO2: 97% 98% 98% 99%  Weight:      Height:        Intake/Output Summary (Last 24 hours) at 09/13/2022 1130 Last data filed at 09/13/2022 0900 Gross per 24 hour  Intake 360 ml  Output 1800 ml  Net -1440 ml    Wt Readings from Last 3 Encounters:  09/02/22 48.7 kg  08/23/22 47.8 kg  08/17/22 46 kg    Examination: Constitutional: NAD, cachectic appearing Eyes: lids and conjunctivae normal, no scleral icterus ENMT: mmm Neck: normal, supple Respiratory: Diffuse rhonchi and diminished on the right lung field.  No wheezing Cardiovascular: Regular rate and rhythm, no murmurs / rubs / gallops.  Abdomen: soft, no distention, no tenderness. Bowel sounds positive.  Skin: no rashes  Data Reviewed: I have independently reviewed following labs and imaging studies   CBC Recent Labs  Lab 09/09/22 0412 09/10/22 0256 09/11/22 0211 09/12/22 0245  09/13/22 0257  WBC 14.5* 13.6* 14.3* 16.0* 13.2*  HGB 10.2* 10.0* 10.2* 10.0* 9.2*  HCT 31.1* 30.5* 31.0* 31.2* 28.1*  PLT 726* 658* 564* 508* 428*  MCV 93.4 93.8 93.1 94.8 94.3  MCH 30.6 30.8 30.6 30.4 30.9  MCHC 32.8 32.8 32.9 32.1 32.7  RDW 14.2 14.6 14.9 14.8 15.1     Recent Labs  Lab 09/08/22 0227 09/09/22 0412 09/10/22 0256 09/12/22 0245 09/13/22 0257  NA 132* 134* 134* 133* 135  K 3.3* 3.6 3.9 3.1* 3.4*  CL 101 103 105 99 99  CO2 20* 22 21* 24 27  GLUCOSE 112* 109* 104* 128* 113*  BUN 26* 22 17 18 16   CREATININE 0.66 0.60 0.53 0.52 0.47  CALCIUM 8.2* 8.1* 8.1* 7.9* 7.8*  AST 19  --   --   --   --   ALT 14  --   --   --   --   ALKPHOS 63  --   --   --   --   BILITOT 0.6  --   --   --   --   ALBUMIN 1.6*  --   --   --   --  MG 1.7 1.7 1.6*  --  1.4*     ------------------------------------------------------------------------------------------------------------------ No results for input(s): "CHOL", "HDL", "LDLCALC", "TRIG", "CHOLHDL", "LDLDIRECT" in the last 72 hours.  No results found for: "HGBA1C" ------------------------------------------------------------------------------------------------------------------ No results for input(s): "TSH", "T4TOTAL", "T3FREE", "THYROIDAB" in the last 72 hours.  Invalid input(s): "FREET3"  Cardiac Enzymes No results for input(s): "CKMB", "TROPONINI", "MYOGLOBIN" in the last 168 hours.  Invalid input(s): "CK" ------------------------------------------------------------------------------------------------------------------    Component Value Date/Time   BNP 120.5 (H) 09/02/2022 1400    CBG: Recent Labs  Lab 09/09/22 0829 09/09/22 1143 09/09/22 1635 09/13/22 0918  GLUCAP 110* 89 104* 99     Recent Results (from the past 240 hour(s))  Aerobic/Anaerobic Culture w Gram Stain (surgical/deep wound)     Status: None   Collection Time: 09/04/22 10:44 AM   Specimen: Pleural Fluid  Result Value Ref Range Status    Specimen Description PLEURAL  Final   Special Requests RIGHT  Final   Gram Stain NO ORGANISMS SEEN NO WBC SEEN   Final   Culture   Final    No growth aerobically or anaerobically. Performed at [facility], [place] [address] [place], [place] [address]    Report Status 09/09/2022 FINAL  Final    Radiology Studies: No results found.  Marzetta Board, MD, PhD [facility]  Between 7 am - 7 pm I am available, please contact me via Amion (for emergencies) or Securechat (non urgent messages)  Between 7 pm - 7 am I am not available, please contact night coverage MD/APP via Amion

## 2022-09-13 NOTE — Progress Notes (Signed)
Pt anxious this am reported feeling overall bad. Stated " I'm unable to walk, I'm unable to move, I'm unable to have #2. I feel light my chest is tight. I have cough." Pt reported pain to right lung. Rn Probation officer discussed available meds. Pt and niece agreed to morphine 1mg . Med given. Pt given xopenex due to Respiratory unavailable. After pt is resting at this time. Respiration even unlabored.

## 2022-09-14 ENCOUNTER — Ambulatory Visit: Payer: Medicare Other | Admitting: Pulmonary Disease

## 2022-09-14 ENCOUNTER — Ambulatory Visit (HOSPITAL_BASED_OUTPATIENT_CLINIC_OR_DEPARTMENT_OTHER): Payer: Medicare Other | Admitting: Pulmonary Disease

## 2022-09-14 DIAGNOSIS — I10 Essential (primary) hypertension: Secondary | ICD-10-CM | POA: Diagnosis not present

## 2022-09-14 DIAGNOSIS — J910 Malignant pleural effusion: Secondary | ICD-10-CM | POA: Diagnosis not present

## 2022-09-14 DIAGNOSIS — C3491 Malignant neoplasm of unspecified part of right bronchus or lung: Secondary | ICD-10-CM | POA: Diagnosis not present

## 2022-09-14 DIAGNOSIS — J869 Pyothorax without fistula: Secondary | ICD-10-CM | POA: Diagnosis not present

## 2022-09-14 DIAGNOSIS — Z515 Encounter for palliative care: Secondary | ICD-10-CM | POA: Diagnosis not present

## 2022-09-14 DIAGNOSIS — Z7189 Other specified counseling: Secondary | ICD-10-CM | POA: Diagnosis not present

## 2022-09-14 LAB — CBC
HCT: 31.2 % — ABNORMAL LOW (ref 36.0–46.0)
Hemoglobin: 10.2 g/dL — ABNORMAL LOW (ref 12.0–15.0)
MCH: 30.8 pg (ref 26.0–34.0)
MCHC: 32.7 g/dL (ref 30.0–36.0)
MCV: 94.3 fL (ref 80.0–100.0)
Platelets: 410 10*3/uL — ABNORMAL HIGH (ref 150–400)
RBC: 3.31 MIL/uL — ABNORMAL LOW (ref 3.87–5.11)
RDW: 15.3 % (ref 11.5–15.5)
WBC: 14.7 10*3/uL — ABNORMAL HIGH (ref 4.0–10.5)
nRBC: 0 % (ref 0.0–0.2)

## 2022-09-14 LAB — BASIC METABOLIC PANEL
Anion gap: 11 (ref 5–15)
BUN: 15 mg/dL (ref 8–23)
CO2: 25 mmol/L (ref 22–32)
Calcium: 8.2 mg/dL — ABNORMAL LOW (ref 8.9–10.3)
Chloride: 98 mmol/L (ref 98–111)
Creatinine, Ser: 0.43 mg/dL — ABNORMAL LOW (ref 0.44–1.00)
GFR, Estimated: >60 mL/min (ref >60)
Glucose, Bld: 109 mg/dL — ABNORMAL HIGH (ref 70–99)
Potassium: 4.2 mmol/L (ref 3.5–5.1)
Sodium: 134 mmol/L — ABNORMAL LOW (ref 135–145)

## 2022-09-14 LAB — MAGNESIUM: Magnesium: 1.8 mg/dL (ref 1.7–2.4)

## 2022-09-14 MED ORDER — HYDROXYZINE HCL 10 MG PO TABS
10.0000 mg | ORAL_TABLET | Freq: Three times a day (TID) | ORAL | Status: DC | PRN
  Administered 2022-09-14: 10 mg via ORAL
  Filled 2022-09-14: qty 1

## 2022-09-14 MED ORDER — ENSURE ENLIVE PO LIQD
237.0000 mL | Freq: Three times a day (TID) | ORAL | Status: DC
  Administered 2022-09-14 (×2): 237 mL via ORAL

## 2022-09-14 MED ORDER — MORPHINE SULFATE 10 MG/5ML PO SOLN: 5.0000 mg | ORAL | Status: DC | PRN

## 2022-09-14 MED ORDER — ONDANSETRON HCL 4 MG/2ML IJ SOLN: 4.0000 mg | Freq: Four times a day (QID) | INTRAMUSCULAR | Status: DC | PRN

## 2022-09-14 MED ORDER — MORPHINE SULFATE (CONCENTRATE) 10 MG/0.5ML PO SOLN
5.0000 mg | ORAL | Status: DC | PRN
  Administered 2022-09-15 – 2022-09-16 (×7): 5 mg via SUBLINGUAL
  Filled 2022-09-14 (×6): qty 0.5

## 2022-09-14 NOTE — Progress Notes (Signed)
Occupational Therapy Treatment Patient Details Name: Gail Jennings MRN: [id number] DOB: [birth year] Today's Date: 09/14/2022   History of present illness Patient is a 89 year old female with Loculated pleural effusion s/p chest tube placement on 12/31, pneumonia, acute respiratory failure with hypoxia, A-fib with RVR. History of metastatic adenocarcinoma of right lung with chemotherapy.   OT comments  OT beginning conversation with niece with regard to discharge planning. Niece stating that she wants patient to go home with hospice, where OT educated that if hospice, therapies would not come out to the house. Niece is in understanding. OT attempting to initiate conversation with regard to positioning and equipment, however chaplain and CSW entering OT conversation therefore niece asking conversation to be continued at another time. Frequency changed, with equipment recommendations to be assessed next session. OT will continue to follow.    Recommendations for follow up therapy are one component of a multi-disciplinary discharge planning process, led by the attending physician.  Recommendations may be updated based on patient status, additional functional criteria and insurance authorization.    Follow Up Recommendations  Other (comment) (Hospice, will assess equipment next session)     Assistance Recommended at Discharge Frequent or constant Supervision/Assistance  Patient can return home with the following  Two people to help with walking and/or transfers;Two people to help with bathing/dressing/bathroom;Assistance with cooking/housework;Direct supervision/assist for medications management;Direct supervision/assist for financial management;Assist for transportation;Help with stairs or ramp for entrance   Equipment Recommendations  Other (comment) (Need to discuss with neice)    Recommendations for Other Services      Precautions / Restrictions Precautions Precautions:  Fall Restrictions Weight Bearing Restrictions: No       Mobility Bed Mobility                    Transfers                         Balance                                           ADL either performed or assessed with clinical judgement   ADL Overall ADL's : Needs assistance/impaired                                       General ADL Comments: Neice asking OT not to wake patient at this time, session focus beginning education as family has chosen hospice care    Extremity/Trunk Assessment              Vision       Perception     Praxis      Cognition Arousal/Alertness: Lethargic Behavior During Therapy: Anxious                                   General Comments: Neice asking OT not to wake patient at this time, session focus beginning education as family has chosen hospice care        Exercises      Shoulder Instructions       General Comments      Pertinent Vitals/ Pain       Pain Assessment Pain Assessment: Faces Faces Pain  Scale: No hurt  Home Living                                          Prior Functioning/Environment              Frequency  Min 1X/week        Progress Toward Goals  OT Goals(current goals can now be found in the care plan section)  Progress towards OT goals: Progressing toward goals  Acute Rehab OT Goals Patient Stated Goal: to get patient home OT Goal Formulation: With family Time For Goal Achievement: 09/21/22 Potential to Achieve Goals: [place] Discharge plan needs to be updated;Equipment recommendations need to be updated    Co-evaluation                 AM-PAC OT "6 Clicks" Daily Activity     Outcome Measure   Help from another person eating meals?: A Lot Help from another person taking care of personal grooming?: A Lot Help from another person toileting, which includes using toliet, bedpan, or  urinal?: A Lot Help from another person bathing (including washing, rinsing, drying)?: A Lot Help from another person to put on and taking off regular upper body clothing?: A Lot Help from another person to put on and taking off regular lower body clothing?: A Lot 6 Click Score: 12    End of Session    OT Visit Diagnosis: Unsteadiness on feet (R26.81);Other abnormalities of gait and mobility (R26.89);Muscle weakness (generalized) (M62.81);Other symptoms and signs involving cognitive function;Adult, failure to thrive (R62.7)   Activity Tolerance Patient limited by fatigue;Patient limited by lethargy   Patient Left in bed;with call bell/phone within reach;with bed alarm set   Nurse Communication Mobility status        Time: 4818-5631 OT Time Calculation (min): 10 min  Charges: OT General Charges $OT Visit: 1 Visit  Corinne Ports E. Tiawana Forgy, OTR/L Acute Rehabilitation Services [phone]   Ascencion Dike 09/14/2022, 2:39 PM

## 2022-09-14 NOTE — Progress Notes (Addendum)
Palliative Medicine Progress Note   Patient Name: Gail Jennings       Date: 09/14/2022 DOB: 09-Feb-1933  Age: 89 y.o. MRN#: [id number] Attending Physician: Aline August, MD Primary Care Physician: Burnard Bunting, MD Admit Date: 09/02/2022    HPI/Patient Profile: 89 y.o. female  with past medical history of HTN, PAF on Eliquis, and metastatic adenocarcinoma of the right lung with recurrent malignant effusion admitted on 09/02/2022 with shortness of breath, right middle lower lobe pneumonia as well as loculated pleural effusion on the right. IR placed chest tube 12/31. Receiving antibiotic therapy. Follows with Dr. Inda Merlin for metastatic lung cancer. PMT consulted to discuss [place].   Subjective: Chart reviewed and patient assessed at bedside. She is awake and alert.  She continues to be very weak.  She immediately tells me "dont pressure me" when I walk in. I assure her we do not want her to feel pressured, only want to offer support and answer any questions she may have. She tells me her concerns about care that can be arranged at home and we discuss this. Discuss what type of medical equipment can be provided at home. She tells me she has had some shortness of breath and is receiving PRN morphine and this has been helpful for anxiety and shortness of breath. I offered to speak to her niece further and she agrees.   Spoke with [employer] via telephone. She tells me they have decided to transition to her home with support of hospice. We discuss type of support provided. Will refer to [facility] to assist with discharge with hospice. [name] is also arranging for additional caregivers to be in the home.   Objective:  Physical Exam Vitals reviewed.  Constitutional:      General: She is not in acute  distress.    Appearance: She is cachectic. She is ill-appearing.  Pulmonary:     Comments: Very slightly labored Neurological:     Mental Status: She is alert and oriented to person, place, and time.     Motor: Weakness present.  Psychiatric:        Mood and Affect: Mood is anxious.             Vital Signs: BP (!) 133/96 (BP Location: Right Arm)   Pulse 71   Temp 97.6 F (36.4 C) (Oral)  Resp 15   Ht 5\' 4"  (1.626 m)   Wt 48.7 kg   SpO2 97%   BMI 18.43 kg/m  SpO2: SpO2: 97 % O2 Device: O2 Device: Nasal Cannula O2 Flow Rate: O2 Flow Rate (L/min): 3 L/min    Palliative Medicine Assessment & Plan   Assessment: Principal Problem:   Empyema (HCC) Active Problems:   Essential hypertension   Primary lung adenocarcinoma, right ([facility])   Goals of care, counseling/discussion   Malignant pleural effusion    Recommendations/Plan: Continue current supportive measures Patient and niece seem to be leaning toward discharge home (with hospice and private care) - referral made to [facility] and hospice  PMT will follow Continue PRN morphine - will add sublingual morphine in preparation for discharge home  Code Status: DNR/DNI  Prognosis:  < 6 months  Discharge Planning: To Be Determined    Thank you for allowing the Palliative Medicine Team to assist in the care of this patient.   Juel Burrow, DNP, AGNP-C Palliative Medicine Team Team Phone # [phone]  Pager # [phone]    Please contact Palliative Medicine Team phone at [phone] for questions and concerns.  For individual provider, see AMION.

## 2022-09-14 NOTE — TOC Progression Note (Signed)
Transition of Care [facility]) - Progression Note    Patient Details  Name: Gail Jennings MRN: [id number] Date of Birth: Feb 24, 1933  Transition of Care [facility]) CM/SW [place], [place] Phone Number: 09/14/2022, 1:52 PM  Clinical Narrative:    CSW was notified by Palliative that pt and family have decided to pursue home with hospice services. CSW met with Gail Jennings at bedside and provided choice. Gail Jennings chose [facility], referral was made. Gail Jennings requests all planning go through her, as the pt is very anxious. Gail Jennings states they are currently working on Engineer, maintenance (IT) care. Pt has an accessible bathroom, and Gail Jennings is unsure if they would want a hospital bed, CSW to defer to hospice. Pt has a new O2 need, will need ordered prior to DC. Gail Jennings is hopeful that pt will "rally" and gain some strength, but is realistic to the situation. She questioned the need for antibiotics at this time. CSW noted the hospice and hospital medical teams will determine what is medically necessary. Hospice stating they will review referral. TOC will continue to follow for DC needs.    Expected Discharge Plan: Home w Hospice Care Barriers to Discharge: Equipment Delay, Transportation, Barriers Unresolved (comment) (Private care and hospice services)  Expected Discharge Plan and Services In-house Referral: Clinical Social Work, Hospice / Palliative Care Discharge Planning Services: CM Consult Post Acute Care Choice: Hospice Living arrangements for the past 2 months: Single Family Home                                       Social Determinants of Health (SDOH) Interventions SDOH Screenings   Food Insecurity: No Food Insecurity (09/03/2022)  Housing: Low Risk  (09/03/2022)  Transportation Needs: No Transportation Needs (09/03/2022)  Utilities: Not At Risk (09/03/2022)  Tobacco Use: Medium Risk (09/03/2022)    Readmission Risk Interventions     No data to display

## 2022-09-14 NOTE — Progress Notes (Signed)
Manufacturing engineer [facility]) Hospital Liaison Note  Received request from Transitions of Care Manager, Janett Billow, for hospice services at home after discharge. Chart and patient information under review by [facility] physician.   Spoke with [name] to initiate education related to hospice philosophy, services, and team approach to care. She verbalized understanding of information given. Per discussion, the plan is for patient to discharge home via EMS once cleared to DC.    DME needs discussed. Family has requested that DME is not ordered today while ongoing conversations continue of patient needs. Plan is for [facility] HLT to speak with family on 1.11 to confirm & order DME.  Address verified and is correct in the chart.    Please send signed and completed DNR home with patient/family. Please provide prescriptions at discharge as needed to ensure ongoing symptom management.    [facility] information and contact numbers given to family & above information shared with TOC.   Please call with any questions/concerns.    Thank you for the opportunity to participate in this patient's care.   Daphene Calamity, MSW [facility] Liaison  [phone]

## 2022-09-14 NOTE — Progress Notes (Signed)
Nutrition Brief Note  Patient screened for LOS and low BMI.   Reviewed patient's chart. PMH significant for metastatic adenocarcinoma of the R lung with recurrent malignant effusion. PMT following. Noted plans for discharge home with hospice. Patient has had a poor appetite but continues to try to eat small amounts as tolerated. She is currently remains on a regular diet. She is agreeable to nutrition supplements as desired.   No further nutrition interventions warranted at this time. If nutrition issues arise, please consult RD.   Clayborne Dana, RDN, LDN Clinical Nutrition

## 2022-09-14 NOTE — Progress Notes (Cosign Needed Addendum)
Physical Therapy Treatment Patient Details Name: Gail Jennings MRN: [id number] DOB: Nov 24, 1932 Today's Date: 09/14/2022   History of Present Illness Patient is a 89 year old female with Loculated pleural effusion s/p chest tube placement on 12/31, pneumonia, acute respiratory failure with hypoxia, A-fib with RVR. History of metastatic adenocarcinoma of right lung with chemotherapy.    PT Comments    Pt received in supine, NT assisting her onto bed pan, pt defers EOB/OOB mobility but niece present and receptive to instruction and clarification of DME needs for DC, pressure relief strategies while pt too fatigued for OOB and supine LE HEP. Once pt off bed pan, she needed totalA +2 for posterior supine scooting to [facility] and pt agreeable to bed being placed in chair posture. Handouts given as detailed below for further instruction on exercises, fall risk prevention at home, use of IS and pressure sore care/prevention as pt has not been mobilizing OOB. DME needs updated below as pt not progressing and likely to need total care from hospice/nursing staff once she goes home, discussed with supervising PT Ginger Carne. If pt instead DC to SNF, defer DME needs to post-acute.   Recommendations for follow up therapy are one component of a multi-disciplinary discharge planning process, led by the attending physician.  Recommendations may be updated based on patient status, additional functional criteria and insurance authorization.  Follow Up Recommendations  Skilled nursing-short term rehab (<3 hours/day) Can patient physically be transported by private vehicle: No   Assistance Recommended at Discharge Frequent or constant Supervision/Assistance  Patient can return home with the following Assistance with feeding;Assist for transportation;Help with stairs or ramp for entrance;A lot of help with walking and/or transfers;A lot of help with bathing/dressing/bathroom;Direct supervision/assist for medications  management;Direct supervision/assist for financial management   Equipment Recommendations  Rolling walker (2 wheels);BSC/3in1;Wheelchair (measurements PT);Wheelchair cushion (measurements PT);Hospital bed;Other (comment) (mechanical lift; per niece, she may refuse hospital bed, would benefit from air bed/pressure relief overlay for her bed at home and to obtain bed rails if she defers hospital bed) Very narrow wheelchair, likely 16" width or smaller as pt very frail   Recommendations for Other Services       Precautions / Restrictions Precautions Precautions: Fall Restrictions Weight Bearing Restrictions: No     Mobility  Bed Mobility Overal bed mobility: Needs Assistance Bed Mobility: Rolling Rolling: Min assist         General bed mobility comments: patient declined EOB due to fatigue and discomfort after using bed pan (pt did not have BM) but was agreeable to repositioning with bed placed in chair position, PTA showed niece how to adjust this on bed touch screen.    Transfers                   General transfer comment: pt defers, not receptive today        Balance Overall balance assessment: Needs assistance     Sitting balance - Comments: pt defers; too fatigued/weak                                    Cognition Arousal/Alertness: Awake/alert Behavior During Therapy: Anxious, Flat affect Overall Cognitive Status: Difficult to assess Area of Impairment: Safety/judgement, Awareness, Problem solving                 Orientation Level:  (pt not receptive to questioning, defer to ask at session time)  Following Commands: Follows one step commands consistently Safety/Judgement: Decreased awareness of safety, Decreased awareness of deficits Awareness: Intellectual Problem Solving: Requires verbal cues, Requires tactile cues General Comments: Session focus beginning education as family has chosen hospice care, niece [name] receptive to  discussion on DME needs with PTA, pt anxious due to NT just assisted her on to bed pan. After pt done on bed pan, pt agreeable to RN/PTA scooting her up in bed and bed placed in chair position.        Exercises Other Exercises Other Exercises: supine LE HEP given, pt defers to perform, encouraged her to continue IS to build endurance and clear lungs (handout given for IS also)    General Comments General comments (skin integrity, edema, etc.): handout brought to room on pressure relief strategies in bed/chair, supine LE HEP at bed level as pt will not be able to get HHPT upon DC if she goes home with hospice care, and also for IS use      Pertinent Vitals/Pain Pain Assessment Pain Assessment: Faces Faces Pain Scale: Hurts little more Pain Location: bottom/generalized and constipation related Pain Descriptors / Indicators: Grimacing, Discomfort, Sore Pain Intervention(s): Limited activity within patient's tolerance, Monitored during session, Repositioned, Other (comment) (handout on pressure relief given to pt, highlighted for her)     PT Goals (current goals can now be found in the care plan section) Acute Rehab PT Goals PT Goal Formulation: With patient Time For Goal Achievement: 09/21/22 Progress towards PT goals: Not progressing toward goals - comment    Frequency    Min 3X/week      PT Plan Current plan remains appropriate;Other (comment) (pt may decide on hospice per chart review)       AM-PAC PT "6 Clicks" Mobility   Outcome Measure  Help needed turning from your back to your side while in a flat bed without using bedrails?: A Little Help needed moving from lying on your back to sitting on the side of a flat bed without using bedrails?: A Lot Help needed moving to and from a bed to a chair (including a wheelchair)?: Total (pt defers and has not performed in past few days from nursing report) Help needed standing up from a chair using your arms (e.g., wheelchair or  bedside chair)?: Total Help needed to walk in hospital room?: Total Help needed climbing 3-5 steps with a railing? : Total 6 Click Score: 9    End of Session Equipment Utilized During Treatment: Oxygen Activity Tolerance: Patient limited by fatigue Patient left: in bed;with call bell/phone within reach;with bed alarm set;Other (comment);with family/visitor present (bed in chair posture, HOB ~45*, niece in room) Nurse Communication: Mobility status;Need for lift equipment;Precautions PT Visit Diagnosis: Other abnormalities of gait and mobility (R26.89);Difficulty in walking, not elsewhere classified (R26.2)     Time: 2902-1115 PT Time Calculation (min) (ACUTE ONLY): 20 min  Charges:  $Therapeutic Activity: 8-22 mins                     Malaak Stach P., PTA Acute Rehabilitation Services Secure Chat Preferred 9a-5:30pm Office: [place] 09/14/2022, 6:39 PM

## 2022-09-14 NOTE — Progress Notes (Signed)
OT Cancellation Note  Patient Details Name: Gail Jennings MRN: [id number] DOB: 03-22-1933   Cancelled Treatment:    Reason Eval/Treat Not Completed: Patient declined, no reason specified;Other (comment) Dietary just exiting patient's room, requesting to end their meeting and allow her to sleep. OT will follow back later in the afternoon as time permits.   Corinne Ports E. Pattye Meda, OTR/L Acute Rehabilitation Services [phone]   Gail Jennings 09/14/2022, 11:17 AM

## 2022-09-14 NOTE — Progress Notes (Signed)
PROGRESS NOTE    Gail Jennings  XNT:[id number] DOB: 1932/09/17 DOA: 09/02/2022 PCP: Burnard Bunting, MD   Brief Narrative:  89 year old female with history of HTN, PAF on Eliquis, metastatic adenocarcinoma of the right lung with recurrent malignant effusion came into the hospital with shortness of breath, right middle lower lobe pneumonia as well as loculated pleural effusion on the right.  She presented to [facility] and transferred to [facility] for cardiothoracic surgery evaluation, but upon arrival to [facility] this was deferred to IR who placed the chest tube on 12/31.  Eventually PCCM was consulted, underwent lytic therapy x 1, but given that the chest tube had little to no output it was removed.  ID was also consulted who recommended prolonged antibiotics.  Palliative care was also consulted.  Assessment & Plan:   Acute hypoxic respiratory failure, loculated pleural effusion, status post chest tube- this was placed by IR on 12/31, PCCM consulted and subsequently signed off, status post lytics 09/07/2022.  Chest tube discontinued 1/4, she has been placed on prolonged antibiotics with Augmentin as per ID recommendations -Her respiratory status is tenuous due to underlying pneumonia and underlying known infected malignant pleural effusion.  There is not a good solution here, palliative care consulted.  She is likely to go home with hospice, however has significant anxiety about leaving the hospital and the type of care that she received from hospice nurses.  Family is now arranging 24/7 care.  They are also discussing whether she will be staying here in [place] with hospice/care versus in [place].  Niece has looked into air ambulance in case she will be discharged out of state.  Follow further recommendations from palliative care.  Right middle and lower lobe pneumonia-noted on imaging, antibiotics per ID, currently on Augmentin   Metastatic adenocarcinoma of the right  lung, malignant pleural effusion-follows with Dr. Julien Nordmann as an outpatient.  Treatment is on hold due to acute infection.  -Palliative care following.  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  Likely needs oxygen on discharge.  Currently on 3 L oxygen via nasal cannula.   PAF with RVR-currently on Eliquis.  Continue metoprolol   Thrombocytosis-likely reactive   Severe protein malnutrition-BMI 18, due to malignancy   Essential hypertension-continue metoprolol, blood pressure is stable   Goals of care/disposition-Discussion as above.  Possibly a candidate for home hospice.   DVT prophylaxis: Eliquis Code Status: DNR Family Communication: None at bedside Disposition Plan: Status is: Inpatient Remains inpatient appropriate because: Of severity of illness.  Need for safe disposition plan.  Consultants: CT surgery/IR/ID/palliative care  Procedures: As above  Antimicrobials:  Anti-infectives (From admission, onward)    Start     Dose/Rate Route Frequency Ordered Stop   09/08/22 1800  amoxicillin-clavulanate (AUGMENTIN) 875-125 MG per tablet 1 tablet        1 tablet Oral Every 12 hours 09/08/22 1505     09/07/22 1300  Ampicillin-Sulbactam (UNASYN) 3 g in sodium chloride 0.9 % 100 mL IVPB  Status:  Discontinued        3 g 200 mL/hr over 30 Minutes Intravenous Every 6 hours 09/07/22 0947 09/08/22 1505   09/04/22 1800  vancomycin (VANCOREADY) IVPB 1250 mg/250 mL  Status:  Discontinued        1,250 mg 166.7 mL/hr over 90 Minutes Intravenous Every 48 hours 09/02/22 1756 09/07/22 0947  09/03/22 0100  ceFEPIme (MAXIPIME) 2 g in sodium chloride 0.9 % 100 mL IVPB  Status:  Discontinued        2 g 200 mL/hr over 30 Minutes Intravenous Every 12 hours 09/02/22 1756 09/07/22 0947   09/02/22 1630  vancomycin (VANCOCIN) IVPB 1000 mg/200 mL  premix        1,000 mg 200 mL/hr over 60 Minutes Intravenous  Once 09/02/22 1624 09/02/22 1814   09/02/22 1630  piperacillin-tazobactam (ZOSYN) IVPB 3.375 g        3.375 g 100 mL/hr over 30 Minutes Intravenous  Once 09/02/22 1624 09/02/22 1732        Subjective: Patient seen and examined at bedside.  Does not want to participate in conversation much.  She states "leave me alone".  No fever, agitation, vomiting reported.  Objective: Vitals:   09/13/22 2253 09/14/22 0417 09/14/22 0759 09/14/22 0845  BP: 99/75 (!) 122/93 (!) 133/96   Pulse: (!) 121 97 71   Resp:   15   Temp:  98.1 F (36.7 C) 97.6 F (36.4 C)   TempSrc:  Oral Oral   SpO2: 100% 98% 97% 97%  Weight:      Height:        Intake/Output Summary (Last 24 hours) at 09/14/2022 1340 Last data filed at 09/14/2022 0830 Gross per 24 hour  Intake 120 ml  Output 300 ml  Net -180 ml   Filed Weights   09/02/22 1937 09/02/22 2022  Weight: 45.4 kg 48.7 kg    Examination:  General exam: Appears calm and comfortable.  Elderly female lying in bed.  Chronically ill and deconditioned.  On 3 L oxygen via nasal cannula Respiratory system: Bilateral decreased breath sounds at bases with scattered crackles Cardiovascular system: S1 & S2 heard, Rate controlled Gastrointestinal system: Abdomen is nondistended, soft and nontender. Normal bowel sounds heard. Extremities: No cyanosis, clubbing; trace lower extremity edema present  Central nervous system: Awake, slow to respond.  Poor historian.  No focal neurological deficits. Moving extremities Skin: No rashes, lesions or ulcers Psychiatry: Looks anxious.Does not want to participate in conversation much.  Intermittently gets angry    Data Reviewed: I have personally reviewed following labs and imaging studies  CBC: Recent Labs  Lab 09/10/22 0256 09/11/22 0211 09/12/22 0245 09/13/22 0257 09/14/22 0622  WBC 13.6* 14.3* 16.0* 13.2* 14.7*  HGB 10.0* 10.2* 10.0* 9.2* 10.2*   HCT 30.5* 31.0* 31.2* 28.1* 31.2*  MCV 93.8 93.1 94.8 94.3 94.3  PLT 658* 564* 508* 428* 510*   Basic Metabolic Panel: Recent Labs  Lab 09/08/22 0227 09/09/22 0412 09/10/22 0256 09/12/22 0245 09/13/22 0257 09/14/22 0622  NA 132* 134* 134* 133* 135 134*  K 3.3* 3.6 3.9 3.1* 3.4* 4.2  CL 101 103 105 99 99 98  CO2 20* 22 21* 24 27 25   GLUCOSE 112* 109* 104* 128* 113* 109*  BUN 26* 22 17 18 16 15   CREATININE 0.66 0.60 0.53 0.52 0.47 0.43*  CALCIUM 8.2* 8.1* 8.1* 7.9* 7.8* 8.2*  MG 1.7 1.7 1.6*  --  1.4* 1.8   GFR: Estimated Creatinine Clearance: 38.1 mL/min (A) (by C-G formula based on SCr of 0.43 mg/dL (L)). Liver Function Tests: Recent Labs  Lab 09/08/22 0227  AST 19  ALT 14  ALKPHOS 63  BILITOT 0.6  PROT 4.6*  ALBUMIN 1.6*   No results for input(s): "LIPASE", "AMYLASE" in the last 168 hours. No results for input(s): "AMMONIA" in the last 168 hours. Coagulation Profile:  No results for input(s): "INR", "PROTIME" in the last 168 hours. Cardiac Enzymes: No results for input(s): "CKTOTAL", "CKMB", "CKMBINDEX", "TROPONINI" in the last 168 hours. BNP (last 3 results) No results for input(s): "PROBNP" in the last 8760 hours. HbA1C: No results for input(s): "HGBA1C" in the last 72 hours. CBG: Recent Labs  Lab 09/09/22 0829 09/09/22 1143 09/09/22 1635 09/13/22 0918  GLUCAP 110* 89 104* 99   Lipid Profile: No results for input(s): "CHOL", "HDL", "LDLCALC", "TRIG", "CHOLHDL", "LDLDIRECT" in the last 72 hours. Thyroid Function Tests: No results for input(s): "TSH", "T4TOTAL", "FREET4", "T3FREE", "THYROIDAB" in the last 72 hours. Anemia Panel: No results for input(s): "VITAMINB12", "FOLATE", "FERRITIN", "TIBC", "IRON", "RETICCTPCT" in the last 72 hours. Sepsis Labs: No results for input(s): "PROCALCITON", "LATICACIDVEN" in the last 168 hours.  No results found for this or any previous visit (from the past 240 hour(s)).       Radiology Studies: No results  found.      Scheduled Meds:  amoxicillin-clavulanate  1 tablet Oral Q12H   apixaban  5 mg Oral BID   benzonatate  200 mg Oral TID   Chlorhexidine Gluconate Cloth  6 each Topical Daily   dextromethorphan-guaiFENesin  1 tablet Oral BID   folic acid  1 mg Oral Daily   levalbuterol  0.63 mg Nebulization Q4H   metoprolol tartrate  37.5 mg Oral BID   oxymetazoline  1 spray Each Nare BID   pneumococcal 20-valent conjugate vaccine  0.5 mL Intramuscular Tomorrow-1000   senna-docusate  2 tablet Oral BID   Continuous Infusions:        Aline August, MD [facility] 09/14/2022, 1:40 PM

## 2022-09-14 NOTE — Progress Notes (Addendum)
Palliative Medicine Progress Note   Patient Name: Gail Jennings       Date: 09/14/2022 DOB: [birth year]  Age: 89 y.o. MRN#: [id number] Attending Physician: Aline August, MD Primary Care Physician: Burnard Bunting, MD Admit Date: 09/02/2022    HPI/Patient Profile: 89 y.o. female  with past medical history of HTN, PAF on Eliquis, and metastatic adenocarcinoma of the right lung with recurrent malignant effusion admitted on 09/02/2022 with shortness of breath, right middle lower lobe pneumonia as well as loculated pleural effusion on the right. IR placed chest tube 12/31. Receiving antibiotic therapy. Follows with Dr. Inda Merlin for metastatic lung cancer. PMT consulted to discuss [place].   Subjective: Chart reviewed and patient assessed at bedside. She is awake and alert.  She continues to be very weak.  Per PT note yesterday 1/8, "she declined getting out of bed despite offering multiple times".   I met with niece/Nadine at bedside.  Discussion was had about the natural trajectory of metastatic cancer.  I shared my concern about Reshunda's poor functional and nutritional status.  Discussed that further cancer treatment is on hold due to recurrent pleural effusions and infection.  Discussed that per ID (progress note 1/4), infection will likely not resolve and she will be on antibiotics indefinitely.  Detailed discussion was had about disposition options - SNF/rehab versus home health/PT versus home hospice. Discussed that rehab is short-term (usually 3-4 weeks). The goal is improvement of functional status, which may not be realistic for patients with advanced illness. Discussed that the goal of hospice is to improve quality of life in the setting of end-stage illness/disease, while allowing a natural  course to occur.    Discussion is had about Zalaya initially discharging to her own home in [place] (with hospice and private care), and then possibly transitioning to Nadine's home in [place].  Annabeth and Nadine request time to consider the above options.   Objective:  Physical Exam Vitals reviewed.  Constitutional:      General: She is not in acute distress.    Appearance: She is cachectic. She is ill-appearing.  Pulmonary:     Effort: No respiratory distress.  Neurological:     Mental Status: She is alert and oriented to person, place, and time.     Motor: Weakness present.  Psychiatric:  Mood and Affect: Mood is anxious.             Vital Signs: BP (!) 133/96 (BP Location: Right Arm)   Pulse 71   Temp 97.6 F (36.4 C) (Oral)   Resp 15   Ht 5\' 4"  (1.626 m)   Wt 48.7 kg   SpO2 97%   BMI 18.43 kg/m  SpO2: SpO2: 97 % O2 Device: O2 Device: Nasal Cannula O2 Flow Rate: O2 Flow Rate (L/min): 3 L/min    Palliative Medicine Assessment & Plan   Assessment: Principal Problem:   [place] (HCC) Active Problems:   Essential hypertension   Primary lung adenocarcinoma, right (HCC)   Goals of care, counseling/discussion   Malignant pleural effusion    Recommendations/Plan: Continue current supportive measures Patient and niece seem to be leaning toward discharge home (with hospice and private care)  PMT will follow-up tomorrow   Code Status: DNR/DNI  Prognosis:  < 6 months  Discharge Planning: To Be Determined    Thank you for allowing the Palliative Medicine Team to assist in the care of this patient.   Greater than 50%  of this time was spent counseling and coordinating care related to the above assessment and plan.  Total time: 55 minutes   Lavena Bullion, NP Palliative Medicine   Please contact Palliative Medicine Team phone at [phone] for questions and concerns.  For individual provider, see AMION.

## 2022-09-15 ENCOUNTER — Encounter: Payer: Self-pay | Admitting: Internal Medicine

## 2022-09-15 ENCOUNTER — Other Ambulatory Visit (HOSPITAL_COMMUNITY): Payer: Self-pay

## 2022-09-15 DIAGNOSIS — C3491 Malignant neoplasm of unspecified part of right bronchus or lung: Secondary | ICD-10-CM | POA: Diagnosis not present

## 2022-09-15 DIAGNOSIS — Z7189 Other specified counseling: Secondary | ICD-10-CM | POA: Diagnosis not present

## 2022-09-15 DIAGNOSIS — J869 Pyothorax without fistula: Secondary | ICD-10-CM | POA: Diagnosis not present

## 2022-09-15 DIAGNOSIS — Z515 Encounter for palliative care: Secondary | ICD-10-CM | POA: Diagnosis not present

## 2022-09-15 MED ORDER — MORPHINE SULFATE (CONCENTRATE) 10 MG/0.5ML PO SOLN: 5.0000 mg | ORAL | 0 refills | Status: DC | PRN

## 2022-09-15 MED ORDER — MORPHINE SULFATE (CONCENTRATE) 10 MG/0.5ML PO SOLN
5.0000 mg | ORAL | 0 refills | Status: DC | PRN
  Filled 2022-09-15: qty 30, 7d supply, fill #0

## 2022-09-15 MED ORDER — HYDROXYZINE HCL 10 MG/5ML PO SYRP: 10.0000 mg | ORAL_SOLUTION | Freq: Three times a day (TID) | ORAL | 0 refills | Status: DC | PRN

## 2022-09-15 MED ORDER — DM-GUAIFENESIN ER 30-600 MG PO TB12: 1.0000 | ORAL_TABLET | Freq: Two times a day (BID) | ORAL | 0 refills | Status: DC

## 2022-09-15 MED ORDER — METOPROLOL TARTRATE 25 MG PO TABS: 25.0000 mg | ORAL_TABLET | Freq: Two times a day (BID) | ORAL | 0 refills | Status: DC

## 2022-09-15 MED ORDER — AMOXICILLIN-POT CLAVULANATE 875-125 MG PO TABS: 1.0000 | ORAL_TABLET | Freq: Two times a day (BID) | ORAL | 0 refills | Status: DC

## 2022-09-15 MED ORDER — APIXABAN 5 MG PO TABS: 5.0000 mg | ORAL_TABLET | Freq: Two times a day (BID) | ORAL | 0 refills | Status: DC

## 2022-09-15 MED ORDER — HYDROXYZINE HCL 10 MG PO TABS: 10.0000 mg | ORAL_TABLET | Freq: Three times a day (TID) | ORAL | 0 refills | Status: DC | PRN

## 2022-09-15 MED ORDER — BENZONATATE 200 MG PO CAPS: 200.0000 mg | ORAL_CAPSULE | Freq: Three times a day (TID) | ORAL | 0 refills | Status: DC

## 2022-09-15 MED ORDER — HYDROXYZINE HCL 10 MG/5ML PO SYRP
10.0000 mg | ORAL_SOLUTION | Freq: Three times a day (TID) | ORAL | 0 refills | Status: DC | PRN
  Filled 2022-09-15: qty 118, 8d supply, fill #0

## 2022-09-15 NOTE — Discharge Summary (Signed)
Physician Discharge Summary  Gail Jennings PNT:[id number] DOB: 03-29-1933 DOA: 09/02/2022  PCP: Burnard Bunting, MD  Admit date: 09/02/2022 Discharge date: 09/15/2022  Admitted From: Home Disposition: Home with hospice  Recommendations for Outpatient Follow-up:  Follow up with home hospice at earliest [place]: Home with hospice Equipment/Devices: Supplemental oxygen as needed for comfort measures/hospice  Discharge Condition: Poor CODE STATUS: DNR Diet recommendation: As per comfort measures/hospice  Brief/Interim Summary: 89 year old female with history of HTN, PAF on Eliquis, metastatic adenocarcinoma of the right lung with recurrent malignant effusion came into the hospital with shortness of breath, right middle lower lobe pneumonia as well as loculated pleural effusion on the right.  She presented to [facility] and transferred to [facility] for cardiothoracic surgery evaluation, but upon arrival to [facility] this was deferred to IR who placed the chest tube on 12/31.  Eventually PCCM was consulted, underwent lytic therapy x 1, but given that the chest tube had little to no output it was removed.  ID was also consulted who recommended prolonged antibiotics.  Palliative care was also consulted.  Patient/family has now decided to pursue comfort measures/hospice.  She is agreeable to discontinue all nonessential medications.  She will be discharged home with hospice once arrangements have been made.    Discharge Diagnoses:   Comfort measures only status Acute hypoxic respiratory failure, loculated pleural effusion, status post chest tube  Right middle and lower lobe pneumonia  Metastatic adenocarcinoma of the right lung, malignant pleural effusion  Aortic arch moral thrombus  Acute hypoxic respiratory failure  Paroxysmal A-fib Thrombocytosis Severe protein malnutrition Essential hypertension  Plan -Patient/family has now decided to pursue  comfort measures/hospice.  She is agreeable to discontinue all nonessential medications including antibiotics/Eliquis/metoprolol.  She will be discharged home with hospice once arrangements have been made.  Please refer to the discussion in daily progress notes regarding full hospital course.  Discharge Instructions  Discharge Instructions     Diet general   Complete by: As directed    Increase activity slowly   Complete by: As directed    No wound care   Complete by: As directed       Allergies as of 09/15/2022       Reactions   Succinylcholine Chloride Other (See Comments)   Prolonged paralysis Brand name Anectine   Codeine Nausea And Vomiting   Hydrocodone Nausea And Vomiting        Medication List     STOP taking these medications    apixaban 2.5 MG Tabs tablet Commonly known as: ELIQUIS   folic acid 1 MG tablet Commonly known as: FOLVITE   metoprolol tartrate 25 MG tablet Commonly known as: LOPRESSOR       TAKE these medications    hydrOXYzine 10 MG/5ML syrup Commonly known as: ATARAX Take 5 mLs (10 mg total) by mouth 3 (three) times daily as needed for anxiety.   morphine CONCENTRATE 10 MG/0.5ML Soln concentrated solution Place 0.25 mLs (5 mg total) under the tongue every 2 (two) hours as needed for moderate pain or shortness of breath.   ondansetron 8 MG tablet Commonly known as: Zofran Take 1 tablet (8 mg total) by mouth every 8 (eight) hours as needed for nausea or vomiting. Start on day 3 after carboplatin.   prochlorperazine 10 MG tablet Commonly known as: COMPAZINE Take 1 tablet (10 mg total) by mouth every 6 (six) hours as needed for nausea or vomiting.  Follow-up Information     Home hospice Follow up.   Why: at earliest convenience               Allergies  Allergen Reactions   Succinylcholine Chloride Other (See Comments)    Prolonged paralysis Brand name Anectine   Codeine Nausea And Vomiting   Hydrocodone Nausea  And Vomiting    Consultations: CT surgery/IR/ID/palliative care    Procedures/Studies: DG CHEST PORT 1 VIEW  Result Date: 09/11/2022 CLINICAL DATA:  Dyspnea. EXAM: PORTABLE CHEST 1 VIEW COMPARISON:  09/08/2022 FINDINGS: Stable cardiomediastinal contours. Aortic atherosclerosis. Loculated right-sided hydropneumothorax is stable to mildly increased in the interval. Asymmetric increase interstitial opacities within the right lung noted with progressive airspace disease in the right mid and right lower lung. IMPRESSION: 1. There is a loculated right-sided hydropneumothorax with gas seen within the basilar component. This appears stable to mildly increased in the interval. 2. Progressive airspace disease within the right mid and right lower lung compatible with pneumonia. Electronically Signed   By: Kerby Moors M.D.   On: 09/11/2022 17:37   DG CHEST PORT 1 VIEW  Result Date: 09/08/2022 CLINICAL DATA:  Status post chest tube removal EXAM: PORTABLE CHEST 1 VIEW COMPARISON:  09/07/2022 FINDINGS: Pigtail catheter has been removed on the right. Residual effusion is again identified. Patchy airspace opacity in the right base is again seen. Left lung remains clear. Cardiac shadow is stable. IMPRESSION: No pneumothorax is noted following chest tube removal. Persistent small effusion and basilar opacity on the right are seen. Electronically Signed   By: Inez Catalina M.D.   On: 09/08/2022 19:39   CT CHEST WO CONTRAST  Result Date: 09/07/2022 CLINICAL DATA:  Empyema follow-up.  History of lung cancer. EXAM: CT CHEST WITHOUT CONTRAST TECHNIQUE: Multidetector CT imaging of the chest was performed following the standard protocol without IV contrast. RADIATION DOSE REDUCTION: This exam was performed according to the departmental dose-optimization program which includes automated exposure control, adjustment of the mA and/or kV according to patient size and/or use of iterative reconstruction technique. COMPARISON:   Most recent CT 09/02/2022, PET CT 07/29/2022 FINDINGS: Cardiovascular: Aortic atherosclerosis with tortuosity. The heart is normal in size. No significant pericardial effusion. Occasional coronary artery calcifications. Mediastinum/Nodes: Diffuse fluid distention of the esophagus to the thoracic inlet. No convincing esophageal wall thickening. No bulky mediastinal adenopathy. Assessment of the previous 14 mm right hilar node is limited in the absence of IV contrast on the current exam. Lungs/Pleura: Placement of pigtail catheter in the loculated right pleural effusion. Amount of pleural fluid has diminished after catheter placement. Heterogeneous air within this collection has increased and may be use related to lung catheter placement. Small amount of extrapleural air tracks medially into the right lung apex. Right pleural thickening is less well-defined the absence of IV contrast. Dense masslike consolidation in the right lower lobe with additional masslike opacity in the right middle lobe. Patchy consolidation within the right upper and middle lobes. Mild right lung volume loss. Small left pleural effusion which is new. Associated compressive atelectasis in the dependent left lung. Faint ground-glass opacities in the left apical and lateral upper lobe. Mild underlying emphysema. Trachea and central airways are patent. Upper Abdomen: Fluid collection posterior to the right lobe of the liver is unchanged from recent exam. No acute upper abdominal findings. Musculoskeletal: The right chest wall fluid collection is poorly defined in the absence of IV contrast. There is generalized chest wall edema dependently. No acute osseous findings. IMPRESSION: 1.  Placement of pigtail catheter in the loculated right pleural effusion. Amount of pleural fluid has diminished after catheter placement. Heterogeneous air within this collection has increased and may be use related to lung catheter placement. 2. Dense masslike  consolidation in the right lower lobe with additional masslike opacity in the right middle lobe. Patchy consolidation within the right upper and middle lobes. 3. Small left pleural effusion which is new. Associated compressive atelectasis in the dependent left lung. 4. Diffuse fluid distention of the esophagus to the thoracic inlet, can be seen with reflux or dysmotility. 5. Chest wall fluid collection is poorly defined in the absence of IV contrast on the current exam. Aortic Atherosclerosis (ICD10-I70.0). Electronically Signed   By: Keith Rake M.D.   On: 09/07/2022 23:22   DG CHEST PORT 1 VIEW  Result Date: 09/07/2022 CLINICAL DATA:  Chest tube EXAM: PORTABLE CHEST 1 VIEW COMPARISON:  09/06/2022 FINDINGS: Unchanged AP portable chest radiograph. Right-sided pigtail chest tube remains in position about the medial right lung base, with a small, loculated appearing right pleural effusion. Unchanged heterogeneous and interstitial opacity of the right lung base. Left lung is normally aerated. Mild cardiomegaly. Osseous structures unremarkable. IMPRESSION: 1. Right-sided pigtail chest tube remains in position about the medial right lung base, with a small, loculated appearing right pleural effusion. 2. Unchanged heterogeneous and interstitial opacity about the right lung base. 3. Mild cardiomegaly. Electronically Signed   By: Delanna Ahmadi M.D.   On: 09/07/2022 13:45   DG CHEST PORT 1 VIEW  Result Date: 09/06/2022 CLINICAL DATA:  Right pleural effusion EXAM: PORTABLE CHEST 1 VIEW COMPARISON:  Previous studies including the examination of 09/02/2022 FINDINGS: There is interval placement of right chest tube. There is improved aeration in right lower lung fields suggesting decrease in pleural effusion. Increased interstitial markings are seen in right mid and right lower lung fields. There is poor inspiration. There is no pneumothorax. IMPRESSION: There is interval decrease in right pleural effusion after  placement of right chest tube. Still, there is a small to moderate residual right pleural effusion. Infiltrate in the right mid and right lower lung fields may suggest pneumonia. There is no pneumothorax. Electronically Signed   By: Elmer Picker M.D.   On: 09/06/2022 13:12   CT [facility] PLEURAL DRAIN W/INDWELL CATH W/IMG GUIDE  Result Date: 09/04/2022 INDICATION: Loculated left effusion versus empyema EXAM: CT-GUIDED 10 FRENCH RIGHT CHEST TUBE PLACEMENT Date:  09/04/2022 09/04/2022 11:07 am Radiologist:  M. Daryll Brod, MD Guidance:  CT FLUOROSCOPY: None. MEDICATIONS: 1% lidocaine local ANESTHESIA/SEDATION: Moderate (conscious) sedation was employed during this procedure. A total of Versed 1.5 mg and Fentanyl 25 mcg was administered intravenously. Moderate Sedation Time: 11 minutes. The patient's level of consciousness and vital signs were monitored continuously by radiology nursing throughout the procedure under my direct supervision. CONTRAST:  None. COMPLICATIONS: None immediate PROCEDURE: Informed consent was obtained from the patient following explanation of the procedure, risks, benefits and alternatives. The patient understands, agrees and consents for the procedure. All questions were addressed. A time out was performed. Maximal barrier sterile technique utilized including caps, mask, sterile gowns, sterile gloves, large sterile drape, hand hygiene, and ChloraPrep. Previous imaging reviewed. Patient position right anterior oblique. Noncontrast localization CT performed. The lower portion of the large right loculated effusion was localized and marked for access. Under sterile conditions and local anesthesia, an 18 gauge 10 cm access needle was advanced from a lower intercostal approach into the fluid collection. Needle position confirmed with CT.  Syringe aspiration yielded amber colored serosanguineous fluid. Sample sent for culture and laboratory analysis. Guidewire inserted followed by tract  dilatation to insert a 10 [place] drain. Drain catheter position confirmed with additional CT. Catheter secured with a silk suture and connected to external pleura vac. Sterile dressing applied. No immediate complication. Patient tolerated procedure well. IMPRESSION: Successful CT-guided 10 French right chest tube placement. Electronically Signed   By: Jerilynn Mages.  Shick M.D.   On: 09/04/2022 14:00   [place] CHEST (PLEURAL EFFUSION)  Result Date: 09/02/2022 CLINICAL DATA:  89 year old female with recent diagnosis lung cancer recurrent right pleural effusion. The patient presents to the emergency department shortness of breath. EXAM: CHEST ULTRASOUND COMPARISON:  08/09/2022, 08/15/2022, 08/24/2022, 08/30/2022 FINDINGS: Highly complex large right pleural effusion. Multiple echo it septations are present. Debris is visualized within multiple larger fluid pockets. IMPRESSION: Complex, septated right pleural effusion. No thoracentesis was performed at this time due to increased degree of complexity. Recommend attention on forthcoming chest CT. Ruthann Cancer, MD [facility] Electronically Signed   By: Ruthann Cancer M.D.   On: 09/02/2022 16:07   CT Chest W Contrast  Result Date: 09/02/2022 CLINICAL DATA:  Pleural effusion shortness of breath EXAM: CT CHEST WITH CONTRAST TECHNIQUE: Multidetector CT imaging of the chest was performed during intravenous contrast administration. RADIATION DOSE REDUCTION: This exam was performed according to the departmental dose-optimization program which includes automated exposure control, adjustment of the mA and/or kV according to patient size and/or use of iterative reconstruction technique. CONTRAST:  52mL OMNIPAQUE IOHEXOL 300 MG/ML  SOLN COMPARISON:  Chest x-ray 09/02/2022, PET CT 07/29/2022, chest CT 07/12/2022 FINDINGS: Cardiovascular: Moderate aortic atherosclerosis. No aneurysm. Small volume irregular free-floating mural thrombus  at the aortic arch, series 2, image 34. Normal cardiac size. No pericardial effusion. Mediastinum/Nodes: Midline trachea. No thyroid mass. Mild mediastinal and right hilar nodes. Right hilar node measures 14 mm, series 2, image 67. Fluid distension of the esophagus. Lungs/Pleura: Circumferential right pleural thickening raising concern for pleural metastatic disease. Moderate loculated right pleural effusion with interval gas bubbles. Consolidation/mass at the right middle and lower lobes with additional ground-glass density in the right upper and middle lobes which could be due to superimposed infection. Emphysema. Upper Abdomen: Redemonstrated incompletely visualized fluid collection posterior to the right hepatic lobe, this measures 3.9 cm compared with 3.9 cm previously. Musculoskeletal: No acute osseous abnormality. Mild rim enhancing fluid collection within the right posterior chest wall, measuring approximately 6.2 x 1 cm, series 2, image 98, question communication with pleural space at the right 9-10 intercostal space. IMPRESSION: 1. Circumferential right pleural thickening raising concern for pleural metastatic disease. Moderate loculated right pleural effusion with interval gas bubbles, suspect for empyema. Consolidation/mass at the right middle and lower lobes slightly progressive compared to the prior PET CT. Additional ground-glass densities in the right upper and middle lobes potentially due to acute infection. 2. Rim enhancing fluid collection within the right posterior chest wall measuring approximately 6.2 x 1 cm, question communication with pleural space at the right 9-10 intercostal space. 3. Small volume irregular free-floating mural thrombus at the aortic arch, increased risk for thromboembolic phenomenon. 4. Emphysema. 5. Redemonstrated incompletely visualized fluid collection posterior to the right hepatic lobe measuring 3.9 cm. 6. Aortic atherosclerosis. Aortic Atherosclerosis (ICD10-I70.0)  and Emphysema (ICD10-J43.9). Electronically Signed   By: Donavan Foil M.D.   On: 09/02/2022 15:57   DG Chest 2 View  Result Date: 09/02/2022 CLINICAL DATA:  Shortness of breath. EXAM:  CHEST - 2 VIEW COMPARISON:  August 30, 2022. FINDINGS: The heart size and mediastinal contours are within normal limits. Left lung is unremarkable. There appears to be a large loculated hydropneumothorax or possible empyema seen posteriorly in right hemithorax. Right basilar atelectasis or infiltrate is noted. The visualized skeletal structures are unremarkable. IMPRESSION: Probable large loculated right-sided hydropneumothorax or possible empyema is noted posteriorly. CT scan with intravenous contrast is recommended for further evaluation. Electronically Signed   By: Marijo Conception M.D.   On: 09/02/2022 13:05   IR THORACENTESIS ASP PLEURAL SPACE W/IMG GUIDE  Result Date: 08/30/2022 INDICATION: History of lung cancer with recurrent right pleural effusion. Request for therapeutic right thoracentesis. EXAM: ULTRASOUND GUIDED THERAPEUTIC RIGHT THORACENTESIS MEDICATIONS: 10 mL 1 % lidocaine COMPLICATIONS: Possible small apical pneumothorax on postprocedure radiograph; patient remained asymptomatic. SIR level A: No therapy, no consequence. PROCEDURE: An ultrasound guided thoracentesis was thoroughly discussed with the patient and questions answered. The benefits, risks, alternatives and complications were also discussed. The patient understands and wishes to proceed with the procedure. Written consent was obtained. Ultrasound was performed to localize and mark an adequate pocket of fluid in the right chest. The area was then prepped and draped in the normal sterile fashion. 1% Lidocaine was used for local anesthesia. Under ultrasound guidance a 6 Fr Safe-T-Centesis catheter was introduced. Thoracentesis was performed. The catheter was removed and a dressing applied. FINDINGS: A total of approximately 650 cc of hazy, amber  fluid was removed. IMPRESSION: Successful ultrasound guided right thoracentesis yielding 650 cc of pleural fluid. Read by: Narda Rutherford, AGNP-BC Electronically Signed   By: Lucrezia Europe M.D.   On: 08/30/2022 14:57   DG Chest 1 View  Result Date: 08/30/2022 CLINICAL DATA:  Status post right thoracentesis. EXAM: CHEST  1 VIEW COMPARISON:  August 24, 2022 chest x-ray FINDINGS: There is a tiny amount of air in the right pleural space at the apex, introduced at the time of thoracentesis. This is not unexpected. The right-sided pleural effusion is smaller. Remaining effusion and opacity on the right is otherwise stable. The cardiomediastinal silhouette is stable. The left lung is clear. Smaller IMPRESSION: 1. There is a tiny amount of air in the right pleural space at the apex, introduced at the time of thoracentesis. This is not unexpected. 2. The right-sided pleural effusion is smaller in the interval. 3. Remaining opacity and effusion on the right is stable. These results will be called to the ordering clinician or representative by the Radiologist Assistant, and communication documented in the PACS or [employer]. Electronically Signed   By: Dorise Bullion III M.D.   On: 08/30/2022 14:01   IR THORACENTESIS ASP PLEURAL SPACE W/IMG GUIDE  Result Date: 08/24/2022 INDICATION: Patient with history of lung cancer and recurrent right pleural effusions, request received for right thoracentesis. EXAM: ULTRASOUND GUIDED RIGHT THORACENTESIS MEDICATIONS: Local lidocaine 1% only. COMPLICATIONS: None immediate. PROCEDURE: An ultrasound guided thoracentesis was thoroughly discussed with the patient and questions answered. The benefits, risks, alternatives and complications were also discussed. The patient understands and wishes to proceed with the procedure. Written consent was obtained. Ultrasound was performed to localize and mark an adequate pocket of fluid in the right chest. The area was then prepped and draped  in the normal sterile fashion. 1% Lidocaine was used for local anesthesia. Under ultrasound guidance a 19 gauge, 7-cm, Yueh catheter was introduced. Thoracentesis was performed. The catheter was removed and a dressing applied. FINDINGS: A total of approximately 1.3 L of  amber colored fluid was removed. IMPRESSION: Successful ultrasound guided right thoracentesis yielding 1.3 L of pleural fluid. This exam was performed by Tsosie Billing PA-C, and was supervised and interpreted by Dr. Denna Haggard. Electronically Signed   By: Albin Felling M.D.   On: 08/24/2022 12:43   DG Chest 1 View  Result Date: 08/24/2022 CLINICAL DATA:  Post thoracentesis in a female at age 76. Reportedly removal of 1.2 L from the RIGHT chest. EXAM: CHEST  1 VIEW COMPARISON:  August 15, 2022. FINDINGS: Cardiomediastinal contours and hilar structures are stable. Persistent RIGHT basilar airspace disease, airspace process appears potentially increased, pleural fluid the similar to slightly increased as well compared to December 11th. Lung mass not well evaluated. No sign of pneumothorax.  LEFT chest is clear. On limited assessment no acute skeletal process. IMPRESSION: Increased airspace component in the RIGHT lung base, RIGHT lung mass not well evaluated. Little change in the pleural fluid in the RIGHT lung base since August 15, 2022 potentially slightly increased. No pneumothorax. Electronically Signed   By: Zetta Bills M.D.   On: 08/24/2022 11:17      Subjective: Patient seen and examined at bedside.  Still short of breath with exertion.  Refused oral medications this morning.  Wants to leave as soon as possible.  Family members at bedside.  Discharge Exam: Vitals:   09/15/22 0625 09/15/22 0847  BP: 102/72 92/62  Pulse: (!) 124 (!) 130  Resp: (!) 22 19  Temp: 98.4 F (36.9 C) 98.2 F (36.8 C)  SpO2: 100% 97%    General: Pt is alert, awake, not in acute distress.  Currently on 3 L oxygen via nasal cannula.  Looks anxious.   Chronically ill and deconditioned.    The results of significant diagnostics from this hospitalization (including imaging, microbiology, ancillary and laboratory) are listed below for reference.     Microbiology: No results found for this or any previous visit (from the past 240 hour(s)).   Labs: BNP (last 3 results) Recent Labs    07/09/22 2025 07/11/22 0216 09/02/22 1400  BNP 92.3 66.5 628.3*   Basic Metabolic Panel: Recent Labs  Lab 09/09/22 0412 09/10/22 0256 09/12/22 0245 09/13/22 0257 09/14/22 0622  NA 134* 134* 133* 135 134*  K 3.6 3.9 3.1* 3.4* 4.2  CL 103 105 99 99 98  CO2 22 21* 24 27 25   GLUCOSE 109* 104* 128* 113* 109*  BUN 22 17 18 16 15   CREATININE 0.60 0.53 0.52 0.47 0.43*  CALCIUM 8.1* 8.1* 7.9* 7.8* 8.2*  MG 1.7 1.6*  --  1.4* 1.8   Liver Function Tests: No results for input(s): "AST", "ALT", "ALKPHOS", "BILITOT", "PROT", "ALBUMIN" in the last 168 hours. No results for input(s): "LIPASE", "AMYLASE" in the last 168 hours. No results for input(s): "AMMONIA" in the last 168 hours. CBC: Recent Labs  Lab 09/10/22 0256 09/11/22 0211 09/12/22 0245 09/13/22 0257 09/14/22 0622  WBC 13.6* 14.3* 16.0* 13.2* 14.7*  HGB 10.0* 10.2* 10.0* 9.2* 10.2*  HCT 30.5* 31.0* 31.2* 28.1* 31.2*  MCV 93.8 93.1 94.8 94.3 94.3  PLT 658* 564* 508* 428* 410*   Cardiac Enzymes: No results for input(s): "CKTOTAL", "CKMB", "CKMBINDEX", "TROPONINI" in the last 168 hours. BNP: Invalid input(s): "POCBNP" CBG: Recent Labs  Lab 09/09/22 0829 09/09/22 1143 09/09/22 1635 09/13/22 0918  GLUCAP 110* 89 104* 99   D-Dimer No results for input(s): "DDIMER" in the last 72 hours. Hgb A1c No results for input(s): "HGBA1C" in the last 72 hours. Lipid  Profile No results for input(s): "CHOL", "HDL", "LDLCALC", "TRIG", "CHOLHDL", "LDLDIRECT" in the last 72 hours. Thyroid function studies No results for input(s): "TSH", "T4TOTAL", "T3FREE", "THYROIDAB" in the last 72  hours.  Invalid input(s): "FREET3" Anemia work up No results for input(s): "VITAMINB12", "FOLATE", "FERRITIN", "TIBC", "IRON", "RETICCTPCT" in the last 72 hours. Urinalysis No results found for: "COLORURINE", "APPEARANCEUR", "LABSPEC", "PHURINE", "GLUCOSEU", "HGBUR", "BILIRUBINUR", "KETONESUR", "PROTEINUR", "UROBILINOGEN", "NITRITE", "LEUKOCYTESUR" Sepsis Labs Recent Labs  Lab 09/11/22 0211 09/12/22 0245 09/13/22 0257 09/14/22 0622  WBC 14.3* 16.0* 13.2* 14.7*   Microbiology No results found for this or any previous visit (from the past 240 hour(s)).   Time coordinating discharge: 35 minutes  SIGNED:   Aline August, MD  [facility] 09/15/2022, 10:11 AM

## 2022-09-15 NOTE — Consult Note (Signed)
   [facility] CM Inpatient Consult   09/15/2022  Gail Jennings 05-24-1933 [id number]  [facility] [[facility]]  Accountable Care Organization [ACO] Patient: Medicare ACO REACH  *LLOS review and disposition  Chart reviewed and reveals the patient is currently transitioning to Hospice/Palliative Care services.  Plan: Patient will have full case management services through Hospice and needs will be met at the hospice level of care. No [facility] Care Management is planned for transitional needs. Will sign off at transition from hospital.  For questions,   Charlesetta Shanks, RN BSN CCM [facility]  [phone] business mobile phone Toll free office [phone]  *Concierge Line  [phone] Fax number: [phone] [email or web address]

## 2022-09-15 NOTE — Progress Notes (Signed)
Palliative Medicine Progress Note   Patient Name: Gail Jennings       Date: 09/15/2022 DOB: 01/09/1933  Age: 89 y.o. MRN#: [id number] Attending Physician: Aline August, MD Primary Care Physician: Burnard Bunting, MD Admit Date: 09/02/2022    HPI/Patient Profile: 89 y.o. female  with past medical history of HTN, PAF on Eliquis, and metastatic adenocarcinoma of the right lung with recurrent malignant effusion admitted on 09/02/2022 with shortness of breath, right middle lower lobe pneumonia as well as loculated pleural effusion on the right. IR placed chest tube 12/31. Receiving antibiotic therapy. Follows with Dr. Inda Merlin for metastatic lung cancer. PMT consulted to discuss [place].   Subjective: Discussion with Gail Jennings regarding patient's discharge today. Multiple concerns addressed. Concern about medications ordered for discharge - we reviewed these. Concern about oxygen delivery - she will be speaking with social worker and hospice team. Concern about medication administration - requested nurse come to room and show Gail Jennings how to give medications.  Gail Jennings encouraged to address other concerns with hospice team.   Objective:  Physical Exam Vitals reviewed.  Constitutional:      General: She is not in acute distress.    Appearance: She is cachectic. She is ill-appearing.  Pulmonary:     Comments: Very slightly labored Neurological:     Mental Status: She is alert and oriented to person, place, and time.     Motor: Weakness present.  Psychiatric:        Mood and Affect: Mood is anxious.             Vital Signs: BP 92/62 (BP Location: Right Arm)   Pulse (!) 130   Temp 98.2 F (36.8 C) (Oral)   Resp 19   Ht 5\' 4"  (1.626 m)   Wt 48.7 kg   SpO2 96%   BMI 18.43 kg/m  SpO2:  SpO2: 96 % O2 Device: O2 Device: Nasal Cannula O2 Flow Rate: O2 Flow Rate (L/min): 3 L/min    Palliative Medicine Assessment & Plan   Assessment: Principal Problem:   Empyema (HCC) Active Problems:   Essential hypertension   Primary lung adenocarcinoma, right (HCC)   Goals of care, counseling/discussion   Malignant pleural effusion    Recommendations/Plan: Dc home today with hospice support, concerns address with niece  Code Status: DNR/DNI  Prognosis:  <  6 months  Discharge Planning: To Be Determined    Thank you for allowing the Palliative Medicine Team to assist in the care of this patient.   Juel Burrow, DNP, AGNP-C Palliative Medicine Team Team Phone # [phone]  Pager # [phone]    Please contact Palliative Medicine Team phone at [phone] for questions and concerns.  For individual provider, see AMION.

## 2022-09-15 NOTE — Progress Notes (Signed)
Pt has stated that she is ready for hospice care and is ready to die. Pt stated she only wants comfort care and would want to start this today only requesting medication for comfort. [name] was present in the room when Pt stated her wishes.

## 2022-09-15 NOTE — Progress Notes (Signed)
CSW confirmed pt has received medication from [place]. Family has not received O2, hospice aware. Discharge to be held, family aware. TOC will continue to follow.

## 2022-09-15 NOTE — TOC Transition Note (Signed)
Transition of Care [facility]) - CM/SW Discharge Note   Patient Details  Name: Gail Jennings MRN: [id number] Date of Birth: Jul 01, 1933  Transition of Care [facility]) CM/SW Contact:  Gail Jennings, [place] Phone Number: 09/15/2022, 3:42 PM   Clinical Narrative:    Pt to be transported home via PTAR after 5 PM. [facility] following for Hospice services. Family set up PCA w/ [facility]. TOC to sign off at this time.    Final next level of care: Home w Hospice Care Barriers to Discharge: Barriers Resolved   Patient Goals and CMS Choice CMS Medicare.gov Compare Post Acute Care list provided to:: Patient Represenative (must comment) Gail Jennings, HCPOA) Choice offered to / list presented to : [place] / [place]  Discharge Placement                    Name of family member notified: Gail Jennings Patient and family notified of of transfer: 09/15/22  Discharge Plan and Services Additional resources added to the After Visit Summary for   In-house Referral: Clinical Social Work, Hospice / Palliative Care Discharge Planning Services: CM Consult Post Acute Care Choice: Hospice                               Social Determinants of Health ([place]) Interventions SDOH Screenings   Food Insecurity: No Food Insecurity (09/03/2022)  Housing: Low Risk  (09/03/2022)  Transportation Needs: No Transportation Needs (09/03/2022)  Utilities: Not At Risk (09/03/2022)  Tobacco Use: Medium Risk (09/03/2022)     Readmission Risk Interventions     No data to display

## 2022-09-15 NOTE — Progress Notes (Signed)
Patient home meds taken to pharmacy. Please pick it up upon discharge tomorrow.

## 2022-09-16 ENCOUNTER — Other Ambulatory Visit (HOSPITAL_COMMUNITY): Payer: Self-pay

## 2022-09-16 ENCOUNTER — Telehealth (HOSPITAL_COMMUNITY): Payer: Self-pay | Admitting: Pharmacy Technician

## 2022-09-16 ENCOUNTER — Encounter: Payer: Self-pay | Admitting: Internal Medicine

## 2022-09-16 NOTE — Progress Notes (Signed)
Patient is waiting to be discharged home with hospice once arrangements have been made.  Currently medically stable for discharge although prognosis is very poor.  Please refer to the full discharge summary done by me on 09/15/2022 for full details.

## 2022-09-16 NOTE — Plan of Care (Signed)
  Problem: Education: Goal: Knowledge of General Education information will improve Description: Including pain rating scale, medication(s)/side effects and non-pharmacologic comfort measures Outcome: Progressing

## 2022-09-16 NOTE — TOC Transition Note (Signed)
Transition of Care [facility]) - CM/SW Discharge Note   Patient Details  Name: Gail Jennings MRN: [id number] Date of Birth: [birth year]  [facility] CM/SW Contact:  Carley Hammed, LCSWA Phone Number: 09/16/2022, 4:06 PM   Clinical Narrative:     Pt to be transported home via PTAR, scheduled for ASAP. Medications to be sent home with pt, RN aware. O2 and bed delivered, confirmed by family. Hospice and RN updated. TOC to sign off at this time.   Final next level of care: Home w Hospice Care Barriers to Discharge: Barriers Resolved   Patient Goals and CMS Choice CMS Medicare.gov Compare Post Acute Care list provided to:: Patient Represenative (must comment) Gloriajean Dell, HCPOA) Choice offered to / list presented to : [facility] POA / Guardian  Discharge Placement                    Name of family member notified: Nadine Patient and family notified of of transfer: 09/16/22  Discharge Plan and Services Additional resources added to the After Visit Summary for   In-house Referral: Clinical Social Work, Hospice / Palliative Care Discharge Planning Services: CM Consult Post Acute Care Choice: Hospice                    HH Arranged: RN [facility] Agency:  [facility] Hospice) Date [facility] Agency Contacted: 09/13/22      Social Determinants of Health (SDOH) Interventions SDOH Screenings   Food Insecurity: No Food Insecurity (09/03/2022)  Housing: Low Risk  (09/03/2022)  Transportation Needs: No Transportation Needs (09/03/2022)  Utilities: Not At Risk (09/03/2022)  Tobacco Use: Medium Risk (09/03/2022)     Readmission Risk Interventions     No data to display

## 2022-09-16 NOTE — Telephone Encounter (Signed)
Patient Advocate Encounter  Prior Authorization for hydrOXYzine HCl 10MG /5ML syrup has been approved.     Effective dates: 09/16/2022 through 09/17/2023    11/15/2023, CPhT Pharmacy Patient Advocate Specialist [facility] Patient Advocate Team Direct Number: [phone]  Fax: [phone]

## 2022-09-16 NOTE — Telephone Encounter (Signed)
Patient Advocate Encounter   Received notification that prior authorization for hydrOXYzine HCl 10MG /5ML syrup is required.   PA submitted on 09/16/2022 Key [id number] Status is pending       11/15/2022, CPhT Pharmacy Patient Advocate Specialist [facility] Patient Advocate Team Direct Number: [phone]  Fax: [phone]

## 2022-09-16 NOTE — Care Management Important Message (Signed)
Important Message  Patient Details  Name: Gail Jennings MRN: [id number] Date of Birth: 11-08-1932   Medicare Important Message Given:  Yes     Sherilyn Banker 09/16/2022, 1:49 PM

## 2022-09-16 NOTE — Progress Notes (Signed)
CHART NOTE I saw the patient today for a social visit.  I also spoke to her niece Gloriajean Dell who was available during the visit.  The patient was resting in bed comfortably with oxygen nasal cannula.  She denied having any pain.  They have made the decision to stop all form of active treatment which is very reasonable.  She is expected to be discharged with hospice care in the next day or two. They were advised to reach out to me if I can help in any way. Thank you for taking good care of Ms. Tovey.

## 2022-09-17 ENCOUNTER — Other Ambulatory Visit: Payer: Self-pay | Admitting: Internal Medicine

## 2022-09-18 DIAGNOSIS — I469 Cardiac arrest, cause unspecified: Secondary | ICD-10-CM | POA: Diagnosis not present

## 2022-09-29 ENCOUNTER — Ambulatory Visit: Payer: Medicare Other

## 2022-09-29 ENCOUNTER — Other Ambulatory Visit: Payer: Medicare Other

## 2022-09-29 ENCOUNTER — Ambulatory Visit: Payer: Medicare Other | Admitting: Internal Medicine

## 2022-10-06 DEATH — deceased

## 2022-10-20 ENCOUNTER — Other Ambulatory Visit: Payer: Medicare Other

## 2022-10-20 ENCOUNTER — Ambulatory Visit: Payer: Medicare Other | Admitting: Internal Medicine

## 2022-10-20 ENCOUNTER — Ambulatory Visit: Payer: Medicare Other

## 2022-11-10 ENCOUNTER — Ambulatory Visit: Payer: Medicare Other | Admitting: Physician Assistant

## 2022-11-10 ENCOUNTER — Ambulatory Visit: Payer: Medicare Other

## 2022-11-10 ENCOUNTER — Other Ambulatory Visit: Payer: Medicare Other
